# Patient Record
Sex: Male | Born: 1950
Health system: Southern US, Community
[De-identification: ages and names within clinical notes are randomized; demographics above are authoritative.]

## PROBLEM LIST (undated history)

## (undated) DIAGNOSIS — L570 Actinic keratosis: Secondary | ICD-10-CM

## (undated) DIAGNOSIS — I10 Essential (primary) hypertension: Secondary | ICD-10-CM

## (undated) DIAGNOSIS — N200 Calculus of kidney: Secondary | ICD-10-CM

## (undated) HISTORY — DX: Calculus of kidney: N20.0

## (undated) HISTORY — DX: Essential (primary) hypertension: I10

## (undated) HISTORY — PX: TOE SURGERY: SHX1073

## (undated) HISTORY — DX: Actinic keratosis: L57.0

## (undated) HISTORY — PX: SKIN LESION EXCISION: SHX2412

## (undated) HISTORY — PX: COLONOSCOPY: SHX174

---

## 1998-06-07 HISTORY — PX: CHOLECYSTECTOMY: SHX55

## 2013-07-28 LAB — HM COLONOSCOPY

## 2016-06-09 ENCOUNTER — Ambulatory Visit (INDEPENDENT_AMBULATORY_CARE_PROVIDER_SITE_OTHER): Payer: TRICARE For Life (TFL) | Admitting: Family Medicine

## 2016-06-09 ENCOUNTER — Encounter: Payer: Self-pay | Admitting: Family Medicine

## 2016-06-09 VITALS — BP 135/80 | HR 85 | Ht 75.0 in | Wt 275.0 lb

## 2016-06-09 DIAGNOSIS — Z13220 Encounter for screening for lipoid disorders: Secondary | ICD-10-CM

## 2016-06-09 DIAGNOSIS — I10 Essential (primary) hypertension: Secondary | ICD-10-CM

## 2016-06-09 LAB — UA/M W/RFLX CULTURE, ROUTINE
Bilirubin, UA: NEGATIVE
Glucose, UA: NEGATIVE
Ketones, UA: NEGATIVE
LEUKOCYTES UA: NEGATIVE
NITRITE UA: NEGATIVE
PH UA: 6 (ref 5.0–7.5)
Protein, UA: NEGATIVE
RBC, UA: NEGATIVE
Specific Gravity, UA: 1.01 (ref 1.005–1.030)
UUROB: 0.2 mg/dL (ref 0.2–1.0)

## 2016-06-09 LAB — MICROALBUMIN, URINE WAIVED
Creatinine, Urine Waived: 50 mg/dL (ref 10–300)
Microalb, Ur Waived: 10 mg/L (ref 0–19)

## 2016-06-09 NOTE — Progress Notes (Signed)
BP 135/80   Pulse 85   Ht 6' 3"  (1.905 m)   Wt 275 lb (124.7 kg)   SpO2 99%   BMI 34.37 kg/m    Subjective:    Patient ID: Zachary Rogers, male    DOB: 11/30/1950, 66 y.o.   MRN: [id number]  HPI: Zachary Rogers is a 66 y.o. male  Chief Complaint  Patient presents with  . Establish Care   HYPERTENSION Hypertension status: controlled  Satisfied with current treatment? yes Duration of hypertension: chronic BP monitoring frequency:  not checking BP medication side effects:  no Medication compliance: excellent compliance Previous BP meds: amlodipine Aspirin: yes Recurrent headaches: no Visual changes: no Palpitations: no Dyspnea: no Chest pain: no Lower extremity edema: no Dizzy/lightheaded: no  Active Ambulatory Problems    Diagnosis Date Noted  . Hypertension    Resolved Ambulatory Problems    Diagnosis Date Noted  . No Resolved Ambulatory Problems   Past Medical History:  Diagnosis Date  . Hypertension    Past Surgical History:  Procedure Laterality Date  . CHOLECYSTECTOMY  06/07/1998    Outpatient Encounter Prescriptions as of 06/09/2016  Medication Sig  . amLODipine (NORVASC) 5 MG tablet   . Ascorbic Acid (VITAMIN C) 1000 MG tablet Take 1,000 mg by mouth daily.  Marland Kitchen aspirin 325 MG tablet Take 325 mg by mouth daily.  . Cholecalciferol (VITAMIN D3) 3000 units TABS Take 1,000 Units by mouth.  . Cyanocobalamin (VITAMIN B 12 PO) Take 3,000 mcg by mouth daily.  . Glucosamine-Chondroit-Vit C-Mn (GLUCOSAMINE 1500 COMPLEX PO) Take 1,500 mg by mouth 2 (two) times daily.  . Methylsulfonylmethane (MSM) 1500 MG TABS Take 1,500 mg by mouth 2 (two) times daily.  . Multiple Vitamin (MULTIVITAMIN) tablet Take 1 tablet by mouth daily.  . Omega-3 350 MG CPDR Take 360 mg by mouth 2 (two) times daily.  . Omega-3 Fatty Acids (FISH OIL) 1200 MG CAPS Take 1,200 mg by mouth 2 (two) times daily.   No facility-administered encounter medications on file as of 06/09/2016.    No Known  Allergies  Social History   Social History  . Marital status: Married    Spouse name: N/A  . Number of children: N/A  . Years of education: N/A   Occupational History  . Not on file.   Social History Main Topics  . Smoking status: Former Research scientist (life sciences)  . Smokeless tobacco: Never Used  . Alcohol use Not on file  . Drug use: Unknown  . Sexual activity: Not on file   Other Topics Concern  . Not on file   Social History Narrative  . No narrative on file   Family History  Problem Relation Age of Onset  . Lung cancer Mother   . Hypertension Father   . Diabetes Father     Review of Systems  Constitutional: Negative.   Respiratory: Negative.   Cardiovascular: Negative.   Psychiatric/Behavioral: Negative.     Per HPI unless specifically indicated above     Objective:    BP 135/80   Pulse 85   Ht 6' 3"  (1.905 m)   Wt 275 lb (124.7 kg)   SpO2 99%   BMI 34.37 kg/m   Wt Readings from Last 3 Encounters:  06/09/16 275 lb (124.7 kg)    Physical Exam  Constitutional: He is oriented to person, place, and time. He appears well-developed and well-nourished. No distress.  HENT:  Head: Normocephalic and atraumatic.  Right Ear: Hearing normal.  Left Ear: Hearing  normal.  Nose: Nose normal.  Eyes: Conjunctivae and lids are normal. Right eye exhibits no discharge. Left eye exhibits no discharge. No scleral icterus.  Cardiovascular: Normal rate, regular rhythm, normal heart sounds and intact distal pulses.  Exam reveals no gallop and no friction rub.   No murmur heard. Pulmonary/Chest: Effort normal and breath sounds normal. No respiratory distress. He has no wheezes. He has no rales. He exhibits no tenderness.  Musculoskeletal: Normal range of motion.  Neurological: He is alert and oriented to person, place, and time.  Skin: Skin is warm, dry and intact. No rash noted. He is not diaphoretic. No erythema. No pallor.  Psychiatric: He has a normal mood and affect. His speech is  normal and behavior is normal. Judgment and thought content normal. Cognition and memory are normal.  Nursing note and vitals reviewed.   Results for orders placed or performed in visit on 06/09/16  HM COLONOSCOPY  Result Value Ref Range   HM Colonoscopy Patient Reported See Report (in chart), Patient Reported      Assessment & Plan:   Problem List Items Addressed This Visit      Cardiovascular and Mediastinum   Hypertension - Primary    Under good control. Continue current regimen. Continue to monitor. Call with any concerns.       Relevant Medications   amLODipine (NORVASC) 5 MG tablet   aspirin 325 MG tablet   Other Relevant Orders   Microalbumin, Urine Waived   CBC with Differential/Platelet   Comprehensive metabolic panel   TSH   UA/M w/rflx Culture, Routine    Other Visit Diagnoses    Screening for cholesterol level       Labs drawn today. Await results.    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

## 2016-06-09 NOTE — Assessment & Plan Note (Signed)
Under good control. Continue current regimen. Continue to monitor. Call with any concerns. 

## 2016-06-10 LAB — COMPREHENSIVE METABOLIC PANEL
A/G RATIO: 1.8 (ref 1.2–2.2)
ALK PHOS: 82 IU/L (ref 39–117)
ALT: 42 IU/L (ref 0–44)
AST: 27 IU/L (ref 0–40)
Albumin: 4.6 g/dL (ref 3.6–4.8)
BILIRUBIN TOTAL: 1.6 mg/dL — AB (ref 0.0–1.2)
BUN/Creatinine Ratio: 11 (ref 10–24)
BUN: 14 mg/dL (ref 8–27)
CHLORIDE: 102 mmol/L (ref 96–106)
CO2: 23 mmol/L (ref 18–29)
Calcium: 9.3 mg/dL (ref 8.6–10.2)
Creatinine, Ser: 1.22 mg/dL (ref 0.76–1.27)
GFR calc Af Amer: 70 mL/min/{1.73_m2} (ref >59)
GFR calc non Af Amer: 61 mL/min/{1.73_m2} (ref >59)
GLOBULIN, TOTAL: 2.5 g/dL (ref 1.5–4.5)
Glucose: 96 mg/dL (ref 65–99)
POTASSIUM: 4.2 mmol/L (ref 3.5–5.2)
SODIUM: 141 mmol/L (ref 134–144)
Total Protein: 7.1 g/dL (ref 6.0–8.5)

## 2016-06-10 LAB — CBC WITH DIFFERENTIAL/PLATELET
BASOS ABS: 0.1 10*3/uL (ref 0.0–0.2)
BASOS: 1 %
EOS (ABSOLUTE): 0.4 10*3/uL (ref 0.0–0.4)
Eos: 6 %
HEMATOCRIT: 45.7 % (ref 37.5–51.0)
Hemoglobin: 15.8 g/dL (ref 13.0–17.7)
IMMATURE GRANS (ABS): 0 10*3/uL (ref 0.0–0.1)
Immature Granulocytes: 0 %
LYMPHS: 27 %
Lymphocytes Absolute: 1.7 10*3/uL (ref 0.7–3.1)
MCH: 30.3 pg (ref 26.6–33.0)
MCHC: 34.6 g/dL (ref 31.5–35.7)
MCV: 88 fL (ref 79–97)
MONOS ABS: 0.6 10*3/uL (ref 0.1–0.9)
Monocytes: 10 %
NEUTROS ABS: 3.6 10*3/uL (ref 1.4–7.0)
NEUTROS PCT: 56 %
PLATELETS: 210 10*3/uL (ref 150–379)
RBC: 5.21 x10E6/uL (ref 4.14–5.80)
RDW: 13.8 % (ref 12.3–15.4)
WBC: 6.4 10*3/uL (ref 3.4–10.8)

## 2016-06-10 LAB — LIPID PANEL W/O CHOL/HDL RATIO
CHOLESTEROL TOTAL: 228 mg/dL — AB (ref 100–199)
HDL: 63 mg/dL (ref >39)
LDL Calculated: 141 mg/dL — ABNORMAL HIGH (ref 0–99)
TRIGLYCERIDES: 122 mg/dL (ref 0–149)
VLDL Cholesterol Cal: 24 mg/dL (ref 5–40)

## 2016-06-10 LAB — TSH: TSH: 1.46 u[IU]/mL (ref 0.450–4.500)

## 2016-08-14 ENCOUNTER — Encounter: Payer: TRICARE For Life (TFL) | Admitting: Family Medicine

## 2016-08-20 ENCOUNTER — Encounter: Payer: Self-pay | Admitting: Family Medicine

## 2016-08-20 ENCOUNTER — Ambulatory Visit (INDEPENDENT_AMBULATORY_CARE_PROVIDER_SITE_OTHER): Payer: Self-pay | Admitting: Family Medicine

## 2016-08-20 VITALS — BP 152/92 | HR 55 | Ht 76.0 in | Wt 274.0 lb

## 2016-08-20 DIAGNOSIS — Z0289 Encounter for other administrative examinations: Secondary | ICD-10-CM

## 2016-08-20 DIAGNOSIS — I10 Essential (primary) hypertension: Secondary | ICD-10-CM

## 2016-08-20 NOTE — Assessment & Plan Note (Signed)
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 Patient will do dietary modification modest salt and exercise to help gain better control and follow-up in 2 months or so to reevaluate blood pressure.

## 2016-08-20 NOTE — Progress Notes (Signed)
BP (!) 152/92   Pulse (!) 55   Ht 6\' 4"  (1.93 m)   Wt 274 lb (124.3 kg)   BMI 33.35 kg/m    Subjective:    Patient ID: Zachary Rogers, male    DOB: 1950-06-27, 66 y.o.   MRN: [id number]  HPI: Zachary Rogers is a 66 y.o. male  Chief Complaint  Patient presents with  . FAA    Class 2  Patient with previously controlled hypertension and weight and blood pressure elevated. Taking amlodipine without problems or side effects. Patient has gained significant weight over this winter. Discussed Will double down on weight loss recheck in June for blood pressure check if not better will need further evaluation and medications.  Relevant past medical, surgical, family and social history reviewed and updated as indicated. Interim medical history since our last visit reviewed. Allergies and medications reviewed and updated.  Review of Systems  Constitutional: Negative.   HENT: Negative.   Eyes: Negative.   Respiratory: Negative.   Cardiovascular: Negative.   Gastrointestinal: Negative.   Endocrine: Negative.   Genitourinary: Negative.   Musculoskeletal: Negative.   Skin: Negative.   Allergic/Immunologic: Negative.   Neurological: Negative.   Hematological: Negative.   Psychiatric/Behavioral: Negative.     Per HPI unless specifically indicated above     Objective:    BP (!) 152/92   Pulse (!) 55   Ht 6\' 4"  (1.93 m)   Wt 274 lb (124.3 kg)   BMI 33.35 kg/m   Wt Readings from Last 3 Encounters:  08/20/16 274 lb (124.3 kg)  06/09/16 275 lb (124.7 kg)    Physical Exam  Constitutional: He is oriented to person, place, and time. He appears well-developed and well-nourished.  HENT:  Head: Normocephalic.  Right Ear: External ear normal.  Left Ear: External ear normal.  Nose: Nose normal.  Eyes: Conjunctivae and EOM are normal. Pupils are equal, round, and reactive to light.  Neck: Normal range of motion. Neck supple. No thyromegaly present.  Cardiovascular: Normal rate, regular  rhythm, normal heart sounds and intact distal pulses.   Pulmonary/Chest: Effort normal and breath sounds normal.  Abdominal: Soft. Bowel sounds are normal. There is no splenomegaly or hepatomegaly.  Genitourinary: Penis normal.  Musculoskeletal: Normal range of motion.  Lymphadenopathy:    He has no cervical adenopathy.  Neurological: He is alert and oriented to person, place, and time. He has normal reflexes.  Skin: Skin is warm and dry.  Psychiatric: He has a normal mood and affect. His behavior is normal. Judgment and thought content normal.    Results for orders placed or performed in visit on 06/09/16  Microalbumin, Urine Waived  Result Value Ref Range   Microalb, Ur Waived 10 0 - 19 mg/L   Creatinine, Urine Waived 50 10 - 300 mg/dL   Microalb/Creat Ratio <30 <30 mg/g  CBC with Differential/Platelet  Result Value Ref Range   WBC 6.4 3.4 - 10.8 x10E3/uL   RBC 5.21 4.14 - 5.80 x10E6/uL   Hemoglobin 15.8 13.0 - 17.7 g/dL   Hematocrit 45.7 37.5 - 51.0 %   MCV 88 79 - 97 fL   MCH 30.3 26.6 - 33.0 pg   MCHC 34.6 31.5 - 35.7 g/dL   RDW 13.8 12.3 - 15.4 %   Platelets 210 150 - 379 x10E3/uL   Neutrophils 56 Not Estab. %   Lymphs 27 Not Estab. %   Monocytes 10 Not Estab. %   Eos 6 Not Estab. %  Basos 1 Not Estab. %   Neutrophils Absolute 3.6 1.4 - 7.0 x10E3/uL   Lymphocytes Absolute 1.7 0.7 - 3.1 x10E3/uL   Monocytes Absolute 0.6 0.1 - 0.9 x10E3/uL   EOS (ABSOLUTE) 0.4 0.0 - 0.4 x10E3/uL   Basophils Absolute 0.1 0.0 - 0.2 x10E3/uL   Immature Granulocytes 0 Not Estab. %   Immature Grans (Abs) 0.0 0.0 - 0.1 x10E3/uL   Hematology Comments: Note:   Comprehensive metabolic panel  Result Value Ref Range   Glucose 96 65 - 99 mg/dL   BUN 14 8 - 27 mg/dL   Creatinine, Ser 1.22 0.76 - 1.27 mg/dL   GFR calc non Af Amer 61 >59 mL/min/1.73   GFR calc Af Amer 70 >59 mL/min/1.73   BUN/Creatinine Ratio 11 10 - 24   Sodium 141 134 - 144 mmol/L   Potassium 4.2 3.5 - 5.2 mmol/L    Chloride 102 96 - 106 mmol/L   CO2 23 18 - 29 mmol/L   Calcium 9.3 8.6 - 10.2 mg/dL   Total Protein 7.1 6.0 - 8.5 g/dL   Albumin 4.6 3.6 - 4.8 g/dL   Globulin, Total 2.5 1.5 - 4.5 g/dL   Albumin/Globulin Ratio 1.8 1.2 - 2.2   Bilirubin Total 1.6 (H) 0.0 - 1.2 mg/dL   Alkaline Phosphatase 82 39 - 117 IU/L   AST 27 0 - 40 IU/L   ALT 42 0 - 44 IU/L  Lipid Panel w/o Chol/HDL Ratio  Result Value Ref Range   Cholesterol, Total 228 (H) 100 - 199 mg/dL   Triglycerides 122 0 - 149 mg/dL   HDL 63 >39 mg/dL   VLDL Cholesterol Cal 24 5 - 40 mg/dL   LDL Calculated 141 (H) 0 - 99 mg/dL  TSH  Result Value Ref Range   TSH 1.460 0.450 - 4.500 uIU/mL  UA/M w/rflx Culture, Routine  Result Value Ref Range   Specific Gravity, UA 1.010 1.005 - 1.030   pH, UA 6.0 5.0 - 7.5   Color, UA Yellow Yellow   Appearance Ur Clear Clear   Leukocytes, UA Negative Negative   Protein, UA Negative Negative/Trace   Glucose, UA Negative Negative   Ketones, UA Negative Negative   RBC, UA Negative Negative   Bilirubin, UA Negative Negative   Urobilinogen, Ur 0.2 0.2 - 1.0 mg/dL   Nitrite, UA Negative Negative  HM COLONOSCOPY  Result Value Ref Range   HM Colonoscopy Patient Reported See Report (in chart), Patient Reported      Assessment & Plan:   Problem List Items Addressed This Visit      Cardiovascular and Mediastinum   Hypertension    Poor control today but acceptable for the FAA. Patient will do dietary modification modest salt and exercise to help gain better control and follow-up in 2 months or so to reevaluate blood pressure.       Other Visit Diagnoses    History and physical examination, occupation    -  Primary       Follow up plan: Return for BP check.

## 2016-10-23 ENCOUNTER — Ambulatory Visit: Payer: TRICARE For Life (TFL) | Admitting: Family Medicine

## 2016-10-28 ENCOUNTER — Ambulatory Visit: Payer: TRICARE For Life (TFL) | Admitting: Family Medicine

## 2016-11-11 ENCOUNTER — Ambulatory Visit: Payer: TRICARE For Life (TFL) | Admitting: Family Medicine

## 2016-12-03 ENCOUNTER — Ambulatory Visit: Payer: TRICARE For Life (TFL) | Admitting: Family Medicine

## 2016-12-24 ENCOUNTER — Ambulatory Visit: Payer: TRICARE For Life (TFL) | Admitting: Family Medicine

## 2017-02-04 ENCOUNTER — Ambulatory Visit: Payer: TRICARE For Life (TFL) | Admitting: Family Medicine

## 2017-06-02 ENCOUNTER — Encounter: Payer: Self-pay | Admitting: Family Medicine

## 2017-06-02 ENCOUNTER — Ambulatory Visit (INDEPENDENT_AMBULATORY_CARE_PROVIDER_SITE_OTHER): Payer: Medicare Other | Admitting: Family Medicine

## 2017-06-02 VITALS — BP 131/84 | HR 60 | Temp 97.6°F | Ht 74.4 in | Wt 257.0 lb

## 2017-06-02 DIAGNOSIS — Z1159 Encounter for screening for other viral diseases: Secondary | ICD-10-CM

## 2017-06-02 DIAGNOSIS — D229 Melanocytic nevi, unspecified: Secondary | ICD-10-CM | POA: Diagnosis not present

## 2017-06-02 DIAGNOSIS — Z125 Encounter for screening for malignant neoplasm of prostate: Secondary | ICD-10-CM

## 2017-06-02 DIAGNOSIS — Z13220 Encounter for screening for lipoid disorders: Secondary | ICD-10-CM | POA: Diagnosis not present

## 2017-06-02 DIAGNOSIS — I10 Essential (primary) hypertension: Secondary | ICD-10-CM

## 2017-06-02 LAB — UA/M W/RFLX CULTURE, ROUTINE
Bilirubin, UA: NEGATIVE
Glucose, UA: NEGATIVE
Ketones, UA: NEGATIVE
Leukocytes, UA: NEGATIVE
NITRITE UA: NEGATIVE
Protein, UA: NEGATIVE
RBC, UA: NEGATIVE
Specific Gravity, UA: 1.015 (ref 1.005–1.030)
UUROB: 0.2 mg/dL (ref 0.2–1.0)
pH, UA: 7 (ref 5.0–7.5)

## 2017-06-02 LAB — MICROALBUMIN, URINE WAIVED
Creatinine, Urine Waived: 200 mg/dL (ref 10–300)
MICROALB, UR WAIVED: 30 mg/L — AB (ref 0–19)
Microalb/Creat Ratio: <30 mg/g (ref <30)

## 2017-06-02 MED ORDER — AMLODIPINE BESYLATE 5 MG PO TABS: 5.0000 mg | ORAL_TABLET | Freq: Every day | ORAL | 3 refills | Status: DC

## 2017-06-02 NOTE — Progress Notes (Signed)
BP 131/84 (BP Location: Left Arm, Patient Position: Sitting, Cuff Size: Large)   Pulse 60   Temp 97.6 F (36.4 C)   Ht 6' 2.4" (1.89 m)   Wt 257 lb (116.6 kg)   SpO2 95%   BMI 32.64 kg/m    Subjective:    Patient ID: Zachary Rogers, male    DOB: January 06, 1951, 67 y.o.   MRN: [id number]  HPI: Zachary Rogers is a 67 y.o. male  Chief Complaint  Patient presents with  . Hypertension   HYPERTENSION Hypertension status: controlled  Satisfied with current treatment? no Duration of hypertension: chronic BP monitoring frequency:  not checking BP medication side effects:  no Medication compliance: excellent compliance Previous BP meds: amlodipine Aspirin: yes Recurrent headaches: no Visual changes: no Palpitations: no Dyspnea: no Chest pain: no Lower extremity edema: no Dizzy/lightheaded: no  Relevant past medical, surgical, family and social history reviewed and updated as indicated. Interim medical history since our last visit reviewed. Allergies and medications reviewed and updated.  Review of Systems  Constitutional: Negative.   Respiratory: Negative.   Cardiovascular: Negative.   Psychiatric/Behavioral: Negative.     Per HPI unless specifically indicated above     Objective:    BP 131/84 (BP Location: Left Arm, Patient Position: Sitting, Cuff Size: Large)   Pulse 60   Temp 97.6 F (36.4 C)   Ht 6' 2.4" (1.89 m)   Wt 257 lb (116.6 kg)   SpO2 95%   BMI 32.64 kg/m   Wt Readings from Last 3 Encounters:  06/02/17 257 lb (116.6 kg)  08/20/16 274 lb (124.3 kg)  06/09/16 275 lb (124.7 kg)    Physical Exam  Constitutional: He is oriented to person, place, and time. He appears well-developed and well-nourished. No distress.  HENT:  Head: Normocephalic and atraumatic.  Right Ear: Hearing normal.  Left Ear: Hearing normal.  Nose: Nose normal.  Eyes: Conjunctivae and lids are normal. Right eye exhibits no discharge. Left eye exhibits no discharge. No scleral icterus.    Cardiovascular: Normal rate, regular rhythm, normal heart sounds and intact distal pulses. Exam reveals no gallop and no friction rub.  No murmur heard. Pulmonary/Chest: Effort normal and breath sounds normal. No respiratory distress. He has no wheezes. He has no rales. He exhibits no tenderness.  Musculoskeletal: Normal range of motion.  Neurological: He is alert and oriented to person, place, and time.  Skin: Skin is warm, dry and intact. No rash noted. He is not diaphoretic. No erythema. No pallor.  Numerous moles  Psychiatric: He has a normal mood and affect. His speech is normal and behavior is normal. Judgment and thought content normal. Cognition and memory are normal.  Nursing note and vitals reviewed.   Results for orders placed or performed in visit on 06/09/16  Microalbumin, Urine Waived  Result Value Ref Range   Microalb, Ur Waived 10 0 - 19 mg/L   Creatinine, Urine Waived 50 10 - 300 mg/dL   Microalb/Creat Ratio <30 <30 mg/g  CBC with Differential/Platelet  Result Value Ref Range   WBC 6.4 3.4 - 10.8 x10E3/uL   RBC 5.21 4.14 - 5.80 x10E6/uL   Hemoglobin 15.8 13.0 - 17.7 g/dL   Hematocrit 45.7 37.5 - 51.0 %   MCV 88 79 - 97 fL   MCH 30.3 26.6 - 33.0 pg   MCHC 34.6 31.5 - 35.7 g/dL   RDW 13.8 12.3 - 15.4 %   Platelets 210 150 - 379 x10E3/uL   Neutrophils  56 Not Estab. %   Lymphs 27 Not Estab. %   Monocytes 10 Not Estab. %   Eos 6 Not Estab. %   Basos 1 Not Estab. %   Neutrophils Absolute 3.6 1.4 - 7.0 x10E3/uL   Lymphocytes Absolute 1.7 0.7 - 3.1 x10E3/uL   Monocytes Absolute 0.6 0.1 - 0.9 x10E3/uL   EOS (ABSOLUTE) 0.4 0.0 - 0.4 x10E3/uL   Basophils Absolute 0.1 0.0 - 0.2 x10E3/uL   Immature Granulocytes 0 Not Estab. %   Immature Grans (Abs) 0.0 0.0 - 0.1 x10E3/uL   Hematology Comments: Note:   Comprehensive metabolic panel  Result Value Ref Range   Glucose 96 65 - 99 mg/dL   BUN 14 8 - 27 mg/dL   Creatinine, Ser 1.22 0.76 - 1.27 mg/dL   GFR calc non Af Amer  61 >59 mL/min/1.73   GFR calc Af Amer 70 >59 mL/min/1.73   BUN/Creatinine Ratio 11 10 - 24   Sodium 141 134 - 144 mmol/L   Potassium 4.2 3.5 - 5.2 mmol/L   Chloride 102 96 - 106 mmol/L   CO2 23 18 - 29 mmol/L   Calcium 9.3 8.6 - 10.2 mg/dL   Total Protein 7.1 6.0 - 8.5 g/dL   Albumin 4.6 3.6 - 4.8 g/dL   Globulin, Total 2.5 1.5 - 4.5 g/dL   Albumin/Globulin Ratio 1.8 1.2 - 2.2   Bilirubin Total 1.6 (H) 0.0 - 1.2 mg/dL   Alkaline Phosphatase 82 39 - 117 IU/L   AST 27 0 - 40 IU/L   ALT 42 0 - 44 IU/L  Lipid Panel w/o Chol/HDL Ratio  Result Value Ref Range   Cholesterol, Total 228 (H) 100 - 199 mg/dL   Triglycerides 122 0 - 149 mg/dL   HDL 63 >39 mg/dL   VLDL Cholesterol Cal 24 5 - 40 mg/dL   LDL Calculated 141 (H) 0 - 99 mg/dL  TSH  Result Value Ref Range   TSH 1.460 0.450 - 4.500 uIU/mL  UA/M w/rflx Culture, Routine  Result Value Ref Range   Specific Gravity, UA 1.010 1.005 - 1.030   pH, UA 6.0 5.0 - 7.5   Color, UA Yellow Yellow   Appearance Ur Clear Clear   Leukocytes, UA Negative Negative   Protein, UA Negative Negative/Trace   Glucose, UA Negative Negative   Ketones, UA Negative Negative   RBC, UA Negative Negative   Bilirubin, UA Negative Negative   Urobilinogen, Ur 0.2 0.2 - 1.0 mg/dL   Nitrite, UA Negative Negative  HM COLONOSCOPY  Result Value Ref Range   HM Colonoscopy Patient Reported See Report (in chart), Patient Reported      Assessment & Plan:   Problem List Items Addressed This Visit      Cardiovascular and Mediastinum   Hypertension - Primary    Under good control. Continue current regimen. Refills given. Continue to monitor. Call with any concerns.       Relevant Medications   amLODipine (NORVASC) 5 MG tablet   Other Relevant Orders   CBC with Differential/Platelet   Comprehensive metabolic panel   Microalbumin, Urine Waived   TSH   UA/M w/rflx Culture, Routine    Other Visit Diagnoses    Screening for cholesterol level       Labs drawn  today. Await results.    Relevant Orders   Lipid Panel w/o Chol/HDL Ratio   Screening for prostate cancer       Labs drawn today. Await results.  Relevant Orders   PSA   Encounter for hepatitis C screening test for low risk patient       Labs drawn today. Await results.   Relevant Orders   Hepatitis C antibody   Numerous moles       Would like to see dermatology. Referral generated today.   Relevant Orders   Ambulatory referral to Dermatology       Follow up plan: Return When he'd like, for Physical.

## 2017-06-02 NOTE — Assessment & Plan Note (Signed)
Under good control. Continue current regimen. Refills given. Continue to monitor. Call with any concerns.

## 2017-06-03 LAB — TSH: TSH: 1.87 u[IU]/mL (ref 0.450–4.500)

## 2017-06-03 LAB — COMPREHENSIVE METABOLIC PANEL
ALBUMIN: 4.7 g/dL (ref 3.6–4.8)
ALT: 32 IU/L (ref 0–44)
AST: 20 IU/L (ref 0–40)
Albumin/Globulin Ratio: 1.9 (ref 1.2–2.2)
Alkaline Phosphatase: 85 IU/L (ref 39–117)
BILIRUBIN TOTAL: 1.4 mg/dL — AB (ref 0.0–1.2)
BUN / CREAT RATIO: 12 (ref 10–24)
BUN: 15 mg/dL (ref 8–27)
CHLORIDE: 104 mmol/L (ref 96–106)
CO2: 24 mmol/L (ref 20–29)
CREATININE: 1.21 mg/dL (ref 0.76–1.27)
Calcium: 9.5 mg/dL (ref 8.6–10.2)
GFR calc Af Amer: 71 mL/min/{1.73_m2} (ref >59)
GFR calc non Af Amer: 61 mL/min/{1.73_m2} (ref >59)
GLOBULIN, TOTAL: 2.5 g/dL (ref 1.5–4.5)
Glucose: 97 mg/dL (ref 65–99)
POTASSIUM: 4.4 mmol/L (ref 3.5–5.2)
SODIUM: 141 mmol/L (ref 134–144)
Total Protein: 7.2 g/dL (ref 6.0–8.5)

## 2017-06-03 LAB — CBC WITH DIFFERENTIAL/PLATELET
Basophils Absolute: 0.1 x10E3/uL (ref 0.0–0.2)
Basos: 1 %
EOS (ABSOLUTE): 0.2 x10E3/uL (ref 0.0–0.4)
Eos: 4 %
Hematocrit: 46.4 % (ref 37.5–51.0)
Hemoglobin: 16.1 g/dL (ref 13.0–17.7)
Immature Grans (Abs): 0 x10E3/uL (ref 0.0–0.1)
Immature Granulocytes: 0 %
Lymphocytes Absolute: 1.5 x10E3/uL (ref 0.7–3.1)
Lymphs: 27 %
MCH: 30.6 pg (ref 26.6–33.0)
MCHC: 34.7 g/dL (ref 31.5–35.7)
MCV: 88 fL (ref 79–97)
Monocytes Absolute: 0.4 x10E3/uL (ref 0.1–0.9)
Monocytes: 7 %
Neutrophils Absolute: 3.4 x10E3/uL (ref 1.4–7.0)
Neutrophils: 61 %
Platelets: 226 x10E3/uL (ref 150–379)
RBC: 5.26 x10E6/uL (ref 4.14–5.80)
RDW: 13.9 % (ref 12.3–15.4)
WBC: 5.5 x10E3/uL (ref 3.4–10.8)

## 2017-06-03 LAB — LIPID PANEL W/O CHOL/HDL RATIO
Cholesterol, Total: 193 mg/dL (ref 100–199)
HDL: 64 mg/dL (ref >39)
LDL CALC: 104 mg/dL — AB (ref 0–99)
TRIGLYCERIDES: 123 mg/dL (ref 0–149)
VLDL Cholesterol Cal: 25 mg/dL (ref 5–40)

## 2017-06-03 LAB — PSA: PROSTATE SPECIFIC AG, SERUM: 2.7 ng/mL (ref 0.0–4.0)

## 2017-06-03 LAB — HEPATITIS C ANTIBODY: HEP C VIRUS AB: 0.1 {s_co_ratio} (ref 0.0–0.9)

## 2017-06-19 ENCOUNTER — Encounter: Payer: Self-pay | Admitting: Family Medicine

## 2017-06-19 ENCOUNTER — Ambulatory Visit (INDEPENDENT_AMBULATORY_CARE_PROVIDER_SITE_OTHER): Payer: Medicare Other | Admitting: Family Medicine

## 2017-06-19 VITALS — BP 129/80 | HR 62 | Temp 97.8°F | Ht 75.0 in | Wt 258.5 lb

## 2017-06-19 DIAGNOSIS — Z0000 Encounter for general adult medical examination without abnormal findings: Secondary | ICD-10-CM | POA: Diagnosis not present

## 2017-06-19 NOTE — Progress Notes (Signed)
BP 129/80 (BP Location: Left Arm, Patient Position: Sitting, Cuff Size: Large)   Pulse 62   Temp 97.8 F (36.6 C)   Ht 6\' 3"  (1.905 m)   Wt 258 lb 8 oz (117.3 kg)   SpO2 95%   BMI 32.31 kg/m    Subjective:    Patient ID: Zachary Rogers, male    DOB: [birth year], 67 y.o.   MRN: [id number]  HPI: Zachary Rogers is a 67 y.o. male presenting on 06/19/2017 for comprehensive medical examination. Current medical complaints include:none  Interim Problems from his last visit: no  Functional Status Survey: Is the patient deaf or have difficulty hearing?: No Does the patient have difficulty seeing, even when wearing glasses/contacts?: No Does the patient have difficulty concentrating, remembering, or making decisions?: No Does the patient have difficulty walking or climbing stairs?: No Does the patient have difficulty dressing or bathing?: No Does the patient have difficulty doing errands alone such as visiting a doctor's office or shopping?: No  FALL RISK: Fall Risk  06/19/2017 06/02/2017 06/09/2016  Falls in the past year? No No No    Depression Screen Depression screen [facility] 2/9 06/19/2017 06/02/2017 06/09/2016  Decreased Interest 0 0 0  Down, Depressed, Hopeless 0 0 0  PHQ - 2 Score 0 0 0    Advanced Directives Up to date- asked for copy  Past Medical History:  Past Medical History:  Diagnosis Date  . Hypertension     Surgical History:  Past Surgical History:  Procedure Laterality Date  . CHOLECYSTECTOMY  06/07/1998    Medications:  Current Outpatient Medications on File Prior to Visit  Medication Sig  . amLODipine (NORVASC) 5 MG tablet Take 1 tablet (5 mg total) by mouth daily.  . Ascorbic Acid (VITAMIN C) 1000 MG tablet Take 1,000 mg by mouth daily.  Marland Kitchen aspirin 325 MG tablet Take 325 mg by mouth daily.  . Cholecalciferol (VITAMIN D3) 3000 units TABS Take 1,000 Units by mouth.  . Cyanocobalamin (VITAMIN B 12 PO) Take 3,000 mcg by mouth daily.  . Glucosamine-Chondroit-Vit C-Mn  (GLUCOSAMINE 1500 COMPLEX PO) Take 1,500 mg by mouth 2 (two) times daily.  . Methylsulfonylmethane (MSM) 1500 MG TABS Take 1,500 mg by mouth 2 (two) times daily.  . Multiple Vitamin (MULTIVITAMIN) tablet Take 1 tablet by mouth daily.  . Omega-3 Fatty Acids (FISH OIL) 1200 MG CAPS Take 1,200 mg by mouth 2 (two) times daily.   No current facility-administered medications on file prior to visit.     Allergies:  No Known Allergies  Social History:  Social History   Socioeconomic History  . Marital status: Married    Spouse name: Not on file  . Number of children: Not on file  . Years of education: Not on file  . Highest education level: Not on file  Social Needs  . Financial resource strain: Not on file  . Food insecurity - worry: Not on file  . Food insecurity - inability: Not on file  . Transportation needs - medical: Not on file  . Transportation needs - non-medical: Not on file  Occupational History  . Not on file  Tobacco Use  . Smoking status: Former Research scientist (life sciences)  . Smokeless tobacco: Never Used  Substance and Sexual Activity  . Alcohol use: Yes    Alcohol/week: 1.2 - 1.8 oz    Types: 2 - 3 Shots of liquor per week  . Drug use: No  . Sexual activity: Yes  Other Topics Concern  . Not  on file  Social History Narrative  . Not on file   Social History   Tobacco Use  Smoking Status Former Smoker  Smokeless Tobacco Never Used   Social History   Substance and Sexual Activity  Alcohol Use Yes  . Alcohol/week: 1.2 - 1.8 oz  . Types: 2 - 3 Shots of liquor per week    Family History:  Family History  Problem Relation Age of Onset  . Lung cancer Mother   . Hypertension Father   . Diabetes Father     Past medical history, surgical history, medications, allergies, family history and social history reviewed with patient today and changes made to appropriate areas of the chart.   Review of Systems  Constitutional: Negative.   HENT: Negative.   Eyes: Negative.     Respiratory: Negative.   Cardiovascular: Negative.   Gastrointestinal: Negative.   Genitourinary: Negative.   Musculoskeletal: Positive for joint pain. Negative for back pain, falls, myalgias and neck pain.  Skin: Negative.   Neurological: Negative.   Endo/Heme/Allergies: Negative.   Psychiatric/Behavioral: Negative.     All other ROS negative except what is listed above and in the HPI.      Objective:    BP 129/80 (BP Location: Left Arm, Patient Position: Sitting, Cuff Size: Large)   Pulse 62   Temp 97.8 F (36.6 C)   Ht 6\' 3"  (1.905 m)   Wt 258 lb 8 oz (117.3 kg)   SpO2 95%   BMI 32.31 kg/m   Wt Readings from Last 3 Encounters:  06/19/17 258 lb 8 oz (117.3 kg)  06/02/17 257 lb (116.6 kg)  08/20/16 274 lb (124.3 kg)    Physical Exam  Constitutional: He is oriented to person, place, and time. He appears well-developed and well-nourished. No distress.  HENT:  Head: Normocephalic and atraumatic.  Right Ear: Hearing, tympanic membrane, external ear and ear canal normal.  Left Ear: Hearing, tympanic membrane, external ear and ear canal normal.  Nose: Nose normal.  Mouth/Throat: Uvula is midline, oropharynx is clear and moist and mucous membranes are normal. No oropharyngeal exudate.  Eyes: Conjunctivae, EOM and lids are normal. Pupils are equal, round, and reactive to light. Right eye exhibits no discharge. Left eye exhibits no discharge. No scleral icterus.  Neck: Normal range of motion. Neck supple. No JVD present. No tracheal deviation present. No thyromegaly present.  Cardiovascular: Normal rate, regular rhythm, normal heart sounds and intact distal pulses. Exam reveals no gallop and no friction rub.  No murmur heard. Pulmonary/Chest: Effort normal and breath sounds normal. No stridor. No respiratory distress. He has no wheezes. He has no rales. He exhibits no tenderness.  Abdominal: Soft. Bowel sounds are normal. He exhibits no distension and no mass. There is no  tenderness. There is no rebound and no guarding.  Genitourinary:  Genitourinary Comments: Penis and prostate exam deferred with shared decision making  Musculoskeletal: Normal range of motion. He exhibits no edema, tenderness or deformity.  Lymphadenopathy:    He has no cervical adenopathy.  Neurological: He is alert and oriented to person, place, and time. He has normal reflexes. He displays normal reflexes. No cranial nerve deficit. He exhibits normal muscle tone. Coordination normal.  Skin: Skin is warm, dry and intact. No rash noted. He is not diaphoretic. No erythema. No pallor.  Psychiatric: He has a normal mood and affect. His speech is normal and behavior is normal. Judgment and thought content normal. Cognition and memory are normal.  Nursing note  and vitals reviewed.   6CIT Screen 06/19/2017  What Year? 0 points  What month? 0 points  What time? 0 points  Count back from 20 0 points  Months in reverse 0 points  Repeat phrase 4 points  Total Score 4     Results for orders placed or performed in visit on 06/02/17  CBC with Differential/Platelet  Result Value Ref Range   WBC 5.5 3.4 - 10.8 x10E3/uL   RBC 5.26 4.14 - 5.80 x10E6/uL   Hemoglobin 16.1 13.0 - 17.7 g/dL   Hematocrit 46.4 37.5 - 51.0 %   MCV 88 79 - 97 fL   MCH 30.6 26.6 - 33.0 pg   MCHC 34.7 31.5 - 35.7 g/dL   RDW 13.9 12.3 - 15.4 %   Platelets 226 150 - 379 x10E3/uL   Neutrophils 61 Not Estab. %   Lymphs 27 Not Estab. %   Monocytes 7 Not Estab. %   Eos 4 Not Estab. %   Basos 1 Not Estab. %   Neutrophils Absolute 3.4 1.4 - 7.0 x10E3/uL   Lymphocytes Absolute 1.5 0.7 - 3.1 x10E3/uL   Monocytes Absolute 0.4 0.1 - 0.9 x10E3/uL   EOS (ABSOLUTE) 0.2 0.0 - 0.4 x10E3/uL   Basophils Absolute 0.1 0.0 - 0.2 x10E3/uL   Immature Granulocytes 0 Not Estab. %   Immature Grans (Abs) 0.0 0.0 - 0.1 x10E3/uL  Comprehensive metabolic panel  Result Value Ref Range   Glucose 97 65 - 99 mg/dL   BUN 15 8 - 27 mg/dL    Creatinine, Ser 1.21 0.76 - 1.27 mg/dL   GFR calc non Af Amer 61 >59 mL/min/1.73   GFR calc Af Amer 71 >59 mL/min/1.73   BUN/Creatinine Ratio 12 10 - 24   Sodium 141 134 - 144 mmol/L   Potassium 4.4 3.5 - 5.2 mmol/L   Chloride 104 96 - 106 mmol/L   CO2 24 20 - 29 mmol/L   Calcium 9.5 8.6 - 10.2 mg/dL   Total Protein 7.2 6.0 - 8.5 g/dL   Albumin 4.7 3.6 - 4.8 g/dL   Globulin, Total 2.5 1.5 - 4.5 g/dL   Albumin/Globulin Ratio 1.9 1.2 - 2.2   Bilirubin Total 1.4 (H) 0.0 - 1.2 mg/dL   Alkaline Phosphatase 85 39 - 117 IU/L   AST 20 0 - 40 IU/L   ALT 32 0 - 44 IU/L  Lipid Panel w/o Chol/HDL Ratio  Result Value Ref Range   Cholesterol, Total 193 100 - 199 mg/dL   Triglycerides 123 0 - 149 mg/dL   HDL 64 >39 mg/dL   VLDL Cholesterol Cal 25 5 - 40 mg/dL   LDL Calculated 104 (H) 0 - 99 mg/dL  Microalbumin, Urine Waived  Result Value Ref Range   Microalb, Ur Waived 30 (H) 0 - 19 mg/L   Creatinine, Urine Waived 200 10 - 300 mg/dL   Microalb/Creat Ratio <30 <30 mg/g  PSA  Result Value Ref Range   Prostate Specific Ag, Serum 2.7 0.0 - 4.0 ng/mL  TSH  Result Value Ref Range   TSH 1.870 0.450 - 4.500 uIU/mL  UA/M w/rflx Culture, Routine  Result Value Ref Range   Specific Gravity, UA 1.015 1.005 - 1.030   pH, UA 7.0 5.0 - 7.5   Color, UA Yellow Yellow   Appearance Ur Clear Clear   Leukocytes, UA Negative Negative   Protein, UA Negative Negative/Trace   Glucose, UA Negative Negative   Ketones, UA Negative Negative   RBC, UA  Negative Negative   Bilirubin, UA Negative Negative   Urobilinogen, Ur 0.2 0.2 - 1.0 mg/dL   Nitrite, UA Negative Negative  Hepatitis C antibody  Result Value Ref Range   Hep C Virus Ab 0.1 0.0 - 0.9 s/co ratio      Assessment & Plan:   Problem List Items Addressed This Visit    None    Visit Diagnoses    Medicare annual wellness visit, subsequent    -  Primary   Preventive care discussed today as below. Up to date. Call with any concerns.    Routine  general medical examination at a health care facility       Vaccines up to date. Screening labs checked last visit and normal. Colonoscopy up to date. Continue diet and exercise. Call with any concerns.        Preventative Services:  Health Risk Assessment and Personalized Prevention Plan: Done today Bone Mass Measurements: N/A CVD Screening: Up to date Colon Cancer Screening: Up to date Depression Screening: Done today Diabetes Screening: Up to date Glaucoma Screening: See your eye doctor Hepatitis B vaccine: N/A Hepatitis C screening: Up to date HIV Screening: N/A Flu Vaccine: up to date Lung cancer Screening: N/A Obesity Screening: done today Pneumonia Vaccines (2): up to date STI Screening: N/A PSA screening: up to date  Discussed aspirin prophylaxis for myocardial infarction prevention and decision was made to continue ASA  LABORATORY TESTING:  Health maintenance labs ordered today as discussed above.   The natural history of prostate cancer and ongoing controversy regarding screening and potential treatment outcomes of prostate cancer has been discussed with the patient. The meaning of a false positive PSA and a false negative PSA has been discussed. He indicates understanding of the limitations of this screening test and wishes to proceed with screening PSA testing- done previously.   IMMUNIZATIONS:   - Tdap: Tetanus vaccination status reviewed: last tetanus booster within 10 years. - Influenza: Up to date - Pneumovax: Up to date - Prevnar: Up to date - Zostavax vaccine: Up to date  SCREENING: - Colonoscopy: Up to date  Discussed with patient purpose of the colonoscopy is to detect colon cancer at curable precancerous or early stages   PATIENT COUNSELING:    Sexuality: Discussed sexually transmitted diseases, partner selection, use of condoms, avoidance of unintended pregnancy  and contraceptive alternatives.   Advised to avoid cigarette smoking.  I discussed  with the patient that most people either abstain from alcohol or drink within safe limits (<=14/week and <=4 drinks/occasion for males, <=7/weeks and <= 3 drinks/occasion for females) and that the risk for alcohol disorders and other health effects rises proportionally with the number of drinks per week and how often a drinker exceeds daily limits.  Discussed cessation/primary prevention of drug use and availability of treatment for abuse.   Diet: Encouraged to adjust caloric intake to maintain  or achieve ideal body weight, to reduce intake of dietary saturated fat and total fat, to limit sodium intake by avoiding high sodium foods and not adding table salt, and to maintain adequate dietary potassium and calcium preferably from fresh fruits, vegetables, and low-fat dairy products.    stressed the importance of regular exercise  Injury prevention: Discussed safety belts, safety helmets, smoke detector, smoking near bedding or upholstery.   Dental health: Discussed importance of regular tooth brushing, flossing, and dental visits.   Follow up plan: NEXT PREVENTATIVE PHYSICAL DUE IN 1 YEAR. Return in about 1 year (around 06/19/2018)  for Wellness.

## 2017-06-19 NOTE — Patient Instructions (Addendum)
Preventative Services:  Health Risk Assessment and Personalized Prevention Plan: Done today Bone Mass Measurements: N/A CVD Screening: Up to date Colon Cancer Screening: Up to date Depression Screening: Done today Diabetes Screening: Up to date Glaucoma Screening: See your eye doctor Hepatitis B vaccine: N/A Hepatitis C screening: Up to date HIV Screening: N/A Flu Vaccine: up to date Lung cancer Screening: N/A Obesity Screening: done today Pneumonia Vaccines (2): up to date STI Screening: N/A PSA screening: up to date   Health Maintenance, Male A healthy lifestyle and preventive care is important for your health and wellness. Ask your health care provider about what schedule of regular examinations is right for you. What should I know about weight and diet? Eat a Healthy Diet  Eat plenty of vegetables, fruits, whole grains, low-fat dairy products, and lean protein.  Do not eat a lot of foods high in solid fats, added sugars, or salt.  Maintain a Healthy Weight Regular exercise can help you achieve or maintain a healthy weight. You should:  Do at least 150 minutes of exercise each week. The exercise should increase your heart rate and make you sweat (moderate-intensity exercise).  Do strength-training exercises at least twice a week.  Watch Your Levels of Cholesterol and Blood Lipids  Have your blood tested for lipids and cholesterol every 5 years starting at 69 years of age. If you are at high risk for heart disease, you should start having your blood tested when you are 69 years old. You may need to have your cholesterol levels checked more often if: ? Your lipid or cholesterol levels are high. ? You are older than 69 years of age. ? You are at high risk for heart disease.  What should I know about cancer screening? Many types of cancers can be detected early and may often be prevented. Lung Cancer  You should be screened every year for lung cancer if: ? You are a  current smoker who has smoked for at least 30 years. ? You are a former smoker who has quit within the past 15 years.  Talk to your health care provider about your screening options, when you should start screening, and how often you should be screened.  Colorectal Cancer  Routine colorectal cancer screening usually begins at 69 years of age and should be repeated every 5-10 years until you are 69 years old. You may need to be screened more often if early forms of precancerous polyps or small growths are found. Your health care provider may recommend screening at an earlier age if you have risk factors for colon cancer.  Your health care provider may recommend using home test kits to check for hidden blood in the stool.  A small camera at the end of a tube can be used to examine your colon (sigmoidoscopy or colonoscopy). This checks for the earliest forms of colorectal cancer.  Prostate and Testicular Cancer  Depending on your age and overall health, your health care provider may do certain tests to screen for prostate and testicular cancer.  Talk to your health care provider about any symptoms or concerns you have about testicular or prostate cancer.  Skin Cancer  Check your skin from head to toe regularly.  Tell your health care provider about any new moles or changes in moles, especially if: ? There is a change in a mole's size, shape, or color. ? You have a mole that is larger than a pencil eraser.  Always use sunscreen. Apply sunscreen liberally  and repeat throughout the day.  Protect yourself by wearing long sleeves, pants, a wide-brimmed hat, and sunglasses when outside.  What should I know about heart disease, diabetes, and high blood pressure?  If you are 5-63 years of age, have your blood pressure checked every 3-5 years. If you are 40 years of age or older, have your blood pressure checked every year. You should have your blood pressure measured twice-once when you are at  a hospital or clinic, and once when you are not at a hospital or clinic. Record the average of the two measurements. To check your blood pressure when you are not at a hospital or clinic, you can use: ? An automated blood pressure machine at a pharmacy. ? A home blood pressure monitor.  Talk to your health care provider about your target blood pressure.  If you are between 63-106 years old, ask your health care provider if you should take aspirin to prevent heart disease.  Have regular diabetes screenings by checking your fasting blood sugar level. ? If you are at a normal weight and have a low risk for diabetes, have this test once every three years after the age of 27. ? If you are overweight and have a high risk for diabetes, consider being tested at a younger age or more often.  A one-time screening for abdominal aortic aneurysm (AAA) by ultrasound is recommended for men aged 19-75 years who are current or former smokers. What should I know about preventing infection? Hepatitis B If you have a higher risk for hepatitis B, you should be screened for this virus. Talk with your health care provider to find out if you are at risk for hepatitis B infection. Hepatitis C Blood testing is recommended for:  Everyone born from [birth year] through 1967.  Anyone with known risk factors for hepatitis C.  Sexually Transmitted Diseases (STDs)  You should be screened each year for STDs including gonorrhea and chlamydia if: ? You are sexually active and are younger than 69 years of age. ? You are older than 69 years of age and your health care provider tells you that you are at risk for this type of infection. ? Your sexual activity has changed since you were last screened and you are at an increased risk for chlamydia or gonorrhea. Ask your health care provider if you are at risk.  Talk with your health care provider about whether you are at high risk of being infected with HIV. Your health care provider  may recommend a prescription medicine to help prevent HIV infection.  What else can I do?  Schedule regular health, dental, and eye exams.  Stay current with your vaccines (immunizations).  Do not use any tobacco products, such as cigarettes, chewing tobacco, and e-cigarettes. If you need help quitting, ask your health care provider.  Limit alcohol intake to no more than 2 drinks per day. One drink equals 12 ounces of beer, 5 ounces of wine, or 1 ounces of hard liquor.  Do not use street drugs.  Do not share needles.  Ask your health care provider for help if you need support or information about quitting drugs.  Tell your health care provider if you often feel depressed.  Tell your health care provider if you have ever been abused or do not feel safe at home. This information is not intended to replace advice given to you by your health care provider. Make sure you discuss any questions you have with your health  care provider. Document Released: 11/01/2007 Document Revised: 01/02/2016 Document Reviewed: 02/06/2015 Elsevier Interactive Patient Education  2018 [employer]  

## 2017-06-22 DIAGNOSIS — D1800 Hemangioma unspecified site: Secondary | ICD-10-CM | POA: Diagnosis not present

## 2017-06-22 DIAGNOSIS — D225 Melanocytic nevi of trunk: Secondary | ICD-10-CM | POA: Diagnosis not present

## 2017-06-22 DIAGNOSIS — L821 Other seborrheic keratosis: Secondary | ICD-10-CM | POA: Diagnosis not present

## 2017-06-22 DIAGNOSIS — L578 Other skin changes due to chronic exposure to nonionizing radiation: Secondary | ICD-10-CM | POA: Diagnosis not present

## 2017-06-22 DIAGNOSIS — L918 Other hypertrophic disorders of the skin: Secondary | ICD-10-CM | POA: Diagnosis not present

## 2017-06-22 DIAGNOSIS — L859 Epidermal thickening, unspecified: Secondary | ICD-10-CM | POA: Diagnosis not present

## 2017-06-22 DIAGNOSIS — L812 Freckles: Secondary | ICD-10-CM | POA: Diagnosis not present

## 2017-06-22 DIAGNOSIS — D229 Melanocytic nevi, unspecified: Secondary | ICD-10-CM | POA: Diagnosis not present

## 2017-06-22 DIAGNOSIS — B078 Other viral warts: Secondary | ICD-10-CM | POA: Diagnosis not present

## 2017-06-22 DIAGNOSIS — D239 Other benign neoplasm of skin, unspecified: Secondary | ICD-10-CM

## 2017-06-22 DIAGNOSIS — Z1283 Encounter for screening for malignant neoplasm of skin: Secondary | ICD-10-CM | POA: Diagnosis not present

## 2017-06-22 DIAGNOSIS — D485 Neoplasm of uncertain behavior of skin: Secondary | ICD-10-CM | POA: Diagnosis not present

## 2017-06-22 HISTORY — DX: Other benign neoplasm of skin, unspecified: D23.9

## 2017-08-20 ENCOUNTER — Emergency Department
Admission: EM | Admit: 2017-08-20 | Discharge: 2017-08-21 | Disposition: A | Discharge status: Home / Self Care | Payer: Medicare Other | Attending: Emergency Medicine | Admitting: Emergency Medicine

## 2017-08-20 ENCOUNTER — Other Ambulatory Visit: Payer: Self-pay

## 2017-08-20 ENCOUNTER — Emergency Department: Payer: Medicare Other

## 2017-08-20 DIAGNOSIS — R109 Unspecified abdominal pain: Secondary | ICD-10-CM | POA: Diagnosis not present

## 2017-08-20 DIAGNOSIS — I10 Essential (primary) hypertension: Secondary | ICD-10-CM | POA: Diagnosis not present

## 2017-08-20 DIAGNOSIS — N132 Hydronephrosis with renal and ureteral calculous obstruction: Secondary | ICD-10-CM | POA: Diagnosis not present

## 2017-08-20 DIAGNOSIS — Z87891 Personal history of nicotine dependence: Secondary | ICD-10-CM | POA: Insufficient documentation

## 2017-08-20 DIAGNOSIS — Z79899 Other long term (current) drug therapy: Secondary | ICD-10-CM | POA: Diagnosis not present

## 2017-08-20 DIAGNOSIS — N200 Calculus of kidney: Secondary | ICD-10-CM | POA: Diagnosis not present

## 2017-08-20 LAB — BASIC METABOLIC PANEL
Anion gap: 9 (ref 5–15)
BUN: 23 mg/dL — AB (ref 6–20)
CHLORIDE: 103 mmol/L (ref 101–111)
CO2: 25 mmol/L (ref 22–32)
Calcium: 9.3 mg/dL (ref 8.9–10.3)
Creatinine, Ser: 1.43 mg/dL — ABNORMAL HIGH (ref 0.61–1.24)
GFR calc Af Amer: 56 mL/min — ABNORMAL LOW (ref >60)
GFR calc non Af Amer: 48 mL/min — ABNORMAL LOW (ref >60)
GLUCOSE: 136 mg/dL — AB (ref 65–99)
Potassium: 4.2 mmol/L (ref 3.5–5.1)
Sodium: 137 mmol/L (ref 135–145)

## 2017-08-20 LAB — URINALYSIS, COMPLETE (UACMP) WITH MICROSCOPIC
BILIRUBIN URINE: NEGATIVE
Glucose, UA: NEGATIVE mg/dL
KETONES UR: NEGATIVE mg/dL
LEUKOCYTES UA: NEGATIVE
NITRITE: NEGATIVE
PH: 5 (ref 5.0–8.0)
Protein, ur: NEGATIVE mg/dL
SPECIFIC GRAVITY, URINE: 1.024 (ref 1.005–1.030)

## 2017-08-20 LAB — CBC
HCT: 45.1 % (ref 40.0–52.0)
Hemoglobin: 15.5 g/dL (ref 13.0–18.0)
MCH: 30.6 pg (ref 26.0–34.0)
MCHC: 34.3 g/dL (ref 32.0–36.0)
MCV: 89.1 fL (ref 80.0–100.0)
Platelets: 201 10*3/uL (ref 150–440)
RBC: 5.07 MIL/uL (ref 4.40–5.90)
RDW: 13.8 % (ref 11.5–14.5)
WBC: 7.9 10*3/uL (ref 3.8–10.6)

## 2017-08-20 MED ORDER — MORPHINE SULFATE (PF) 4 MG/ML IV SOLN
4.0000 mg | Freq: Once | INTRAVENOUS | Status: AC
  Administered 2017-08-20: 4 mg via INTRAVENOUS

## 2017-08-20 MED ORDER — ONDANSETRON HCL 4 MG/2ML IJ SOLN
INTRAMUSCULAR | Status: AC
Start: 2017-08-20 — End: 2017-08-20
  Administered 2017-08-20: 4 mg via INTRAVENOUS
  Filled 2017-08-20: qty 2

## 2017-08-20 MED ORDER — MORPHINE SULFATE (PF) 4 MG/ML IV SOLN
INTRAVENOUS | Status: AC
  Administered 2017-08-20: 4 mg via INTRAVENOUS
  Filled 2017-08-20: qty 1

## 2017-08-20 MED ORDER — SODIUM CHLORIDE 0.9 % IV BOLUS
1000.0000 mL | Freq: Once | INTRAVENOUS | Status: AC
  Administered 2017-08-20: 1000 mL via INTRAVENOUS

## 2017-08-20 MED ORDER — ONDANSETRON HCL 4 MG/2ML IJ SOLN
4.0000 mg | Freq: Once | INTRAMUSCULAR | Status: AC
  Administered 2017-08-20: 4 mg via INTRAVENOUS

## 2017-08-20 MED ORDER — KETOROLAC TROMETHAMINE 30 MG/ML IJ SOLN
30.0000 mg | Freq: Once | INTRAMUSCULAR | Status: AC
  Administered 2017-08-20: 30 mg via INTRAVENOUS
  Filled 2017-08-20: qty 1

## 2017-08-20 NOTE — ED Notes (Signed)
Patient transported to CT 

## 2017-08-20 NOTE — ED Triage Notes (Signed)
Pt arrives to ED c/o R sided flank pain x few hours. Some nausea. No vomiting. Has not noticed any hematuria. Pt states "I think it's a kidney stone." alert, oriented, ambulatory. No hx of kidney stones.

## 2017-08-20 NOTE — ED Provider Notes (Signed)
[facility] Emergency Department Provider Note   First MD Initiated Contact with Patient 08/20/17 2326     (approximate)  I have reviewed the triage vital signs and the nursing notes.   HISTORY  Chief Complaint Flank Pain    HPI Zachary Petroni. is a 67 y.o. male presents to the emergency department acute onset of 10 out of 10 right flank pain with radiation into the groin which began at approximately 6:00 PM.  Patient admits to nausea however no vomiting.  Patient denies any dysuria or hematuria.  Patient denies any fever.  Patient denies any previous history of kidney stones. He was given IV morphine for presentation room and as such pain is improved current pain score is 5 out of 10.   Past Medical History:  Diagnosis Date  . Hypertension     Patient Active Problem List   Diagnosis Date Noted  . Hypertension     Past Surgical History:  Procedure Laterality Date  . CHOLECYSTECTOMY  06/07/1998    Prior to Admission medications   Medication Sig Start Date End Date Taking? Authorizing Provider  amLODipine (NORVASC) 5 MG tablet Take 1 tablet (5 mg total) by mouth daily. 06/02/17   Johnson, Megan P, DO  Ascorbic Acid (VITAMIN C) 1000 MG tablet Take 1,000 mg by mouth daily.    [provider]  aspirin 325 MG tablet Take 325 mg by mouth daily.    [provider]  Cholecalciferol (VITAMIN D3) 3000 units TABS Take 1,000 Units by mouth.    [provider]  Cyanocobalamin (VITAMIN B 12 PO) Take 3,000 mcg by mouth daily.    [provider]  Glucosamine-Chondroit-Vit C-Mn (GLUCOSAMINE 1500 COMPLEX PO) Take 1,500 mg by mouth 2 (two) times daily.    [provider]  Methylsulfonylmethane (MSM) 1500 MG TABS Take 1,500 mg by mouth 2 (two) times daily.    [provider]  Multiple Vitamin (MULTIVITAMIN) tablet Take 1 tablet by mouth daily.    [provider]  Omega-3 Fatty Acids (FISH OIL) 1200 MG  CAPS Take 1,200 mg by mouth 2 (two) times daily.    [provider]    Allergies No known drug allergies  Family History  Problem Relation Age of Onset  . Lung cancer Mother   . Hypertension Father   . Diabetes Father     Social History Social History   Tobacco Use  . Smoking status: Former Research scientist (life sciences)  . Smokeless tobacco: Never Used  Substance Use Topics  . Alcohol use: Yes    Alcohol/week: 1.2 - 1.8 oz    Types: 2 - 3 Shots of liquor per week  . Drug use: No    Review of Systems Constitutional: No fever/chills Eyes: No visual changes. ENT: No sore throat. Cardiovascular: Denies chest pain. Respiratory: Denies shortness of breath. Gastrointestinal: No abdominal pain.  No nausea, no vomiting.  No diarrhea.  No constipation. Genitourinary: Negative for dysuria. Musculoskeletal: Negative for neck pain.  Positive for back pain. Integumentary: Negative for rash. Neurological: Negative for headaches, focal weakness or numbness.   ____________________________________________   PHYSICAL EXAM:  VITAL SIGNS: ED Triage Vitals  Enc Vitals Group     BP 08/20/17 2052 (!) 197/79     Pulse Rate 08/20/17 2052 (!) 57     Resp 08/20/17 2052 16     Temp 08/20/17 2052 97.6 F (36.4 C)     Temp Source 08/20/17 2052 Oral  SpO2 08/20/17 2052 97 %     Weight 08/20/17 2055 115.2 kg (254 lb)     Height 08/20/17 2055 1.905 m (6\' 3" )     Head Circumference --      Peak Flow --      Pain Score 08/20/17 2055 8     Pain Loc --      Pain Edu? --      Excl. in [place]? --     Constitutional: Alert and oriented. Well appearing and in no acute distress. Eyes: Conjunctivae are normal.  Head: Atraumatic. Mouth/Throat: Mucous membranes are moist.  Oropharynx non-erythematous. Neck: No stridor.   Cardiovascular: Normal rate, regular rhythm. Good peripheral circulation. Grossly normal heart sounds. Respiratory: Normal respiratory effort.  No retractions. Lungs  CTAB. Gastrointestinal: Soft and nontender. No distention.  Musculoskeletal: No lower extremity tenderness nor edema. No gross deformities of extremities. Neurologic:  Normal speech and language. No gross focal neurologic deficits are appreciated.  Skin:  Skin is warm, dry and intact. No rash noted. Psychiatric: Mood and affect are normal. Speech and behavior are normal.  ____________________________________________   LABS (all labs ordered are listed, but only abnormal results are displayed)  Labs Reviewed  URINALYSIS, COMPLETE (UACMP) WITH MICROSCOPIC - Abnormal; Notable for the following components:      Result Value   Color, Urine YELLOW (*)    APPearance CLEAR (*)    Hgb urine dipstick MODERATE (*)    Bacteria, UA RARE (*)    Squamous Epithelial / LPF 0-5 (*)    All other components within normal limits  BASIC METABOLIC PANEL - Abnormal; Notable for the following components:   Glucose, Bld 136 (*)    BUN 23 (*)    Creatinine, Ser 1.43 (*)    GFR calc non Af Amer 48 (*)    GFR calc Af Amer 56 (*)    All other components within normal limits  CBC     RADIOLOGY I, [place] Dorleen Kissel, personally viewed and evaluated these images (plain radiographs) as part of my medical decision making, as well as reviewing the written report by the radiologist.  ED MD interpretation: 5 mm right UVJ kidney stone additional stones noted bilaterally.  Patient also has pulmonary nodules.  As per radiology  Official radiology report(s): Ct Renal Stone Study  Result Date: 08/20/2017 CLINICAL DATA:  Right flank pain EXAM: CT ABDOMEN AND PELVIS WITHOUT CONTRAST TECHNIQUE: Multidetector CT imaging of the abdomen and pelvis was performed following the standard protocol without IV contrast. COMPARISON:  None. FINDINGS: Lower chest: Lung bases show no acute consolidation or pleural effusion. Multiple pulmonary nodules are visualized. Subpleural right middle lobe 5 mm nodule, series 4, image number 7. 2 mm  subpleural right lower lobe pulmonary nodule series 4, image number 8 and series 4, image number 13. Anterior right middle lobe 2 mm pulmonary nodule series 4, image number 9. 2 mm left lower lobe pulmonary nodule, series 4, image number 15. Mild coronary artery calcification. Hepatobiliary: Status post cholecystectomy. No focal hepatic abnormality. Enlarged extrahepatic bile duct measuring up to 13 mm likely due to surgical change. Pancreas: Unremarkable. No pancreatic ductal dilatation or surrounding inflammatory changes. Spleen: Normal in size without focal abnormality. Adrenals/Urinary Tract: Adrenal glands are within normal limits. Intrarenal stones bilaterally. Mild right perinephric fat stranding. Mild right hydronephrosis and hydroureter, secondary to a 5 mm stone at the right UVJ. Stomach/Bowel: Stomach is within normal limits. Appendix appears normal. No evidence of bowel wall thickening, distention,  or inflammatory changes. Sigmoid colon diverticular disease without acute inflammation Vascular/Lymphatic: Mild aortic atherosclerosis. No aneurysmal dilatation. No significantly enlarged lymph nodes. Reproductive: Prostate unremarkable. Clips in the bilateral scrotum. Other: Negative for free air or free fluid. Small fat in the umbilical region. Musculoskeletal: Degenerative changes. No acute or suspicious abnormality IMPRESSION: 1. Mild right hydronephrosis and hydroureter, secondary to a 5 mm stone at the right UVJ 2. Additional stones present within the right left kidneys 3 multiple lower lung pulmonary nodules. No follow-up needed if patient is low-risk (and has no known or suspected primary neoplasm). Non-contrast chest CT can be considered in 12 months if patient is high-risk. This recommendation follows the consensus statement: Guidelines for Management of Incidental Pulmonary Nodules Detected on CT Images: From the Fleischner Society 2017; Radiology 2017; 284:228-243. 3. Sigmoid colon diverticular  disease without acute inflammation Electronically Signed   By: Donavan Foil M.D.   On: 08/20/2017 23:04     Procedures   ____________________________________________   INITIAL IMPRESSION / ASSESSMENT AND PLAN / ED COURSE  As part of my medical decision making, I reviewed the following data within the electronic MEDICAL RECORD NUMBER   67 year old male presenting with above-stated history and physical exam consistent with possible right ureterolithiasis which was confirmed with CT scan.  Patient 5 mm right UVJ stone.  Patient was given IV morphine and subsequently IV Toradol with a liter of fluid with complete resolution of pain at present. ____________________________________________  FINAL CLINICAL IMPRESSION(S) / ED DIAGNOSES  Final diagnoses:  Kidney stone on right side     MEDICATIONS GIVEN DURING THIS VISIT:  Medications  ketorolac (TORADOL) 30 MG/ML injection 30 mg (has no administration in time range)  sodium chloride 0.9 % bolus 1,000 mL (has no administration in time range)  morphine 4 MG/ML injection 4 mg (4 mg Intravenous Given 08/20/17 2231)  ondansetron (ZOFRAN) injection 4 mg (4 mg Intravenous Given 08/20/17 2230)     ED Discharge Orders    None       Note:  This document was prepared using Dragon voice recognition software and may include unintentional dictation errors.    Gregor Hams, MD 08/21/17 [phone]

## 2017-08-20 NOTE — ED Notes (Signed)
Patient c/o right flank pain radiating to right lower abdomen and penis. Patient reports frequent urge to urinate to urinate with decreased urine output. Patient reports pain began approx 4 hours ago.

## 2017-08-21 MED ORDER — ONDANSETRON 4 MG PO TBDP: 4.0000 mg | ORAL_TABLET | Freq: Three times a day (TID) | ORAL | 0 refills | Status: DC | PRN

## 2017-08-21 MED ORDER — OXYCODONE-ACETAMINOPHEN 5-325 MG PO TABS: 1.0000 | ORAL_TABLET | ORAL | 0 refills | Status: DC | PRN

## 2017-08-24 ENCOUNTER — Ambulatory Visit (INDEPENDENT_AMBULATORY_CARE_PROVIDER_SITE_OTHER): Payer: Self-pay | Admitting: Family Medicine

## 2017-08-24 ENCOUNTER — Encounter: Payer: Self-pay | Admitting: Family Medicine

## 2017-08-24 VITALS — BP 116/76 | HR 57 | Ht 74.5 in | Wt 259.0 lb

## 2017-08-24 DIAGNOSIS — N200 Calculus of kidney: Secondary | ICD-10-CM

## 2017-08-24 DIAGNOSIS — I10 Essential (primary) hypertension: Secondary | ICD-10-CM

## 2017-08-24 NOTE — Progress Notes (Signed)
BP 116/76   Pulse (!) 57   Ht 6' 2.5" (1.892 m)   Wt 259 lb (117.5 kg)   SpO2 98%   BMI 32.81 kg/m    Subjective:    Patient ID: Zachary Ley., male    DOB: Jan 23, 1951, 67 y.o.   MRN: [id number]  HPI: Zachary Rogers is a 67 y.o. male  Chief Complaint  Patient presents with  . Flight    2nd class   Patient 6 days ago developed right kidney right flank pain with subsequent diagnosis of 5 mm kidney stone.  This was subsequently passed at least into the bladder.  Unfortunately she has retained kidney stones in both kidneys. Patient has remained asymptomatic and with no medications since.  Also noted on CT with some pulmonary nodules.  Checking with CT navigator for follow-up if needed.  Patient quit smoking 15 years ago and was never heavy smoker.  Blood pressures been well controlled with amlodipine and no side effects.  Relevant past medical, surgical, family and social history reviewed and updated as indicated. Interim medical history since our last visit reviewed. Allergies and medications reviewed and updated.  Review of Systems  Constitutional: Negative.   HENT: Negative.   Eyes: Negative.   Respiratory: Negative.   Cardiovascular: Negative.   Gastrointestinal: Negative.   Endocrine: Negative.   Genitourinary: Negative.   Musculoskeletal: Negative.   Skin: Negative.   Allergic/Immunologic: Negative.   Neurological: Negative.   Hematological: Negative.   Psychiatric/Behavioral: Negative.     Per HPI unless specifically indicated above     Objective:    BP 116/76   Pulse (!) 57   Ht 6' 2.5" (1.892 m)   Wt 259 lb (117.5 kg)   SpO2 98%   BMI 32.81 kg/m   Wt Readings from Last 3 Encounters:  08/24/17 259 lb (117.5 kg)  08/20/17 254 lb (115.2 kg)  06/19/17 258 lb 8 oz (117.3 kg)    Physical Exam  Constitutional: He is oriented to person, place, and time. He appears well-developed and well-nourished.  HENT:  Head: Normocephalic.  Right Ear:  External ear normal.  Left Ear: External ear normal.  Nose: Nose normal.  Eyes: Pupils are equal, round, and reactive to light. Conjunctivae and EOM are normal.  Neck: Normal range of motion. Neck supple. No thyromegaly present.  Cardiovascular: Normal rate, regular rhythm, normal heart sounds and intact distal pulses.  Pulmonary/Chest: Effort normal and breath sounds normal.  Abdominal: Soft. Bowel sounds are normal. There is no splenomegaly or hepatomegaly.  Genitourinary: Penis normal.  Musculoskeletal: Normal range of motion.  Lymphadenopathy:    He has no cervical adenopathy.  Neurological: He is alert and oriented to person, place, and time. He has normal reflexes.  Skin: Skin is warm and dry.  Psychiatric: He has a normal mood and affect. His behavior is normal. Judgment and thought content normal.    Results for orders placed or performed during the hospital encounter of 08/20/17  Urinalysis, Complete w Microscopic  Result Value Ref Range   Color, Urine YELLOW (A) YELLOW   APPearance CLEAR (A) CLEAR   Specific Gravity, Urine 1.024 1.005 - 1.030   pH 5.0 5.0 - 8.0   Glucose, UA NEGATIVE NEGATIVE mg/dL   Hgb urine dipstick MODERATE (A) NEGATIVE   Bilirubin Urine NEGATIVE NEGATIVE   Ketones, ur NEGATIVE NEGATIVE mg/dL   Protein, ur NEGATIVE NEGATIVE mg/dL   Nitrite NEGATIVE NEGATIVE   Leukocytes, UA NEGATIVE NEGATIVE  RBC / HPF TOO NUMEROUS TO COUNT 0 - 5 RBC/hpf   WBC, UA 0-5 0 - 5 WBC/hpf   Bacteria, UA RARE (A) NONE SEEN   Squamous Epithelial / LPF 0-5 (A) NONE SEEN  Basic metabolic panel  Result Value Ref Range   Sodium 137 135 - 145 mmol/L   Potassium 4.2 3.5 - 5.1 mmol/L   Chloride 103 101 - 111 mmol/L   CO2 25 22 - 32 mmol/L   Glucose, Bld 136 (H) 65 - 99 mg/dL   BUN 23 (H) 6 - 20 mg/dL   Creatinine, Ser 1.43 (H) 0.61 - 1.24 mg/dL   Calcium 9.3 8.9 - 10.3 mg/dL   GFR calc non Af Amer 48 (L) >60 mL/min   GFR calc Af Amer 56 (L) >60 mL/min   Anion gap 9 5 -  15  CBC  Result Value Ref Range   WBC 7.9 3.8 - 10.6 K/uL   RBC 5.07 4.40 - 5.90 MIL/uL   Hemoglobin 15.5 13.0 - 18.0 g/dL   HCT 45.1 40.0 - 52.0 %   MCV 89.1 80.0 - 100.0 fL   MCH 30.6 26.0 - 34.0 pg   MCHC 34.3 32.0 - 36.0 g/dL   RDW 13.8 11.5 - 14.5 %   Platelets 201 150 - 440 K/uL      Assessment & Plan:   Problem List Items Addressed This Visit      Cardiovascular and Mediastinum   Hypertension - Primary    CACI hypertension        Genitourinary   Nephrolithiasis    Class 2 issuance pending results from urology evaluation          Follow up plan: Return for As scheduled.

## 2017-08-24 NOTE — Assessment & Plan Note (Signed)
Class 2 issuance pending results from urology evaluation

## 2017-08-24 NOTE — Assessment & Plan Note (Signed)
CACI hypertension

## 2017-08-25 ENCOUNTER — Encounter: Payer: Self-pay | Admitting: Family Medicine

## 2017-08-25 DIAGNOSIS — R918 Other nonspecific abnormal finding of lung field: Secondary | ICD-10-CM | POA: Insufficient documentation

## 2017-08-26 ENCOUNTER — Telehealth: Payer: Self-pay | Admitting: Family Medicine

## 2017-08-26 ENCOUNTER — Telehealth: Payer: Self-pay | Admitting: Urology

## 2017-08-26 DIAGNOSIS — N200 Calculus of kidney: Secondary | ICD-10-CM

## 2017-08-26 NOTE — Progress Notes (Addendum)
08/27/2017 11:58 AM   Zachary Rogers. 08/28/1950 [id number]  Referring provider: Valerie Roys, DO [place], [place] [address]  Chief Complaint  Patient presents with  . Follow-up    HPI: Patient is a 67 year old Caucasian male with a history of nephrolithiasis who presents today as a referral from Dr. Jeananne Rama.  He presented to [facility] ED on August 20, 2017 with a complaint of sudden onset of right-sided flank pain which radiated into the groin with a 10 out of 10 intensity.   CT Renal stone study on 08/20/2017 noted mild right hydronephrosis and hydroureter, secondary to a 5 mm stone at the right UVJ.   Additional stones present within the right left kidneys.  His UA was positive for TNTC RBC's.  WBC count was 7.9.  Serum creatinine was 1.43.  He has subsequently passed that stone   Today, he is having no flank discomfort.  He is not had any gross hematuria.  He is not had any difficulty with urination.  Patient denies any fevers, chills, nausea or vomiting.   His UA today is positive for 6-10 RBC's.    KUB today (08/26/2017) the right UVJ stone is no longer visible.   Very small punctate stone is located in the lower pole of each kidney.    He does not have a prior history of nephrolithiasis.   PMH: Past Medical History:  Diagnosis Date  . Hypertension     Surgical History: Past Surgical History:  Procedure Laterality Date  . CHOLECYSTECTOMY  06/07/1998    Home Medications:  Allergies as of 08/27/2017   No Known Allergies     Medication List        Accurate as of 08/27/17 11:58 AM. Always use your most recent med list.          amLODipine 5 MG tablet Commonly known as:  NORVASC Take 1 tablet (5 mg total) by mouth daily.   aspirin 325 MG tablet Take 325 mg by mouth daily.   Fish Oil 1200 MG Caps Take 1,200 mg by mouth 2 (two) times daily.   GLUCOSAMINE 1500 COMPLEX PO Take 1,500 mg by mouth 2 (two) times daily.   MSM 1500 MG Tabs Take 1,500  mg by mouth 2 (two) times daily.   multivitamin tablet Take 1 tablet by mouth daily.   oxyCODONE-acetaminophen 5-325 MG tablet Commonly known as:  PERCOCET Take 1 tablet by mouth every 4 (four) hours as needed for severe pain.   VITAMIN B 12 PO Take 3,000 mcg by mouth daily.   vitamin C 1000 MG tablet Take 1,000 mg by mouth daily.   Vitamin D3 3000 units Tabs Take 1,000 Units by mouth.       Allergies: No Known Allergies  Family History: Family History  Problem Relation Age of Onset  . Lung cancer Mother   . Hypertension Father   . Diabetes Father   . Prostate cancer Neg Hx   . Bladder Cancer Neg Hx   . Kidney cancer Neg Hx     Social History:  reports that he has quit smoking. He has never used smokeless tobacco. He reports that he drinks about 1.2 - 1.8 oz of alcohol per week. He reports that he does not use drugs.  ROS: UROLOGY Frequent Urination?: No Hard to postpone urination?: No Burning/pain with urination?: No Get up at night to urinate?: No Leakage of urine?: No Urine stream starts and stops?: No Trouble starting stream?:  No Do you have to strain to urinate?: No Blood in urine?: No Urinary tract infection?: No Sexually transmitted disease?: No Injury to kidneys or bladder?: No Painful intercourse?: No Weak stream?: No Erection problems?: No Penile pain?: No  Gastrointestinal Nausea?: No Vomiting?: No Indigestion/heartburn?: No Diarrhea?: Yes Constipation?: No  Constitutional Fever: No Night sweats?: No Weight loss?: No Fatigue?: No  Skin Skin rash/lesions?: No Itching?: No  Eyes Blurred vision?: No Double vision?: No  Ears/Nose/Throat Sore throat?: No Sinus problems?: No  Hematologic/Lymphatic Swollen glands?: No Easy bruising?: No  Cardiovascular Leg swelling?: No Chest pain?: No  Respiratory Cough?: No Shortness of breath?: No  Endocrine Excessive thirst?: No  Musculoskeletal Back pain?: No Joint pain?:  No  Neurological Headaches?: No Dizziness?: No  Psychologic Depression?: No Anxiety?: No  Physical Exam: BP 134/83 (BP Location: Right Arm, Patient Position: Sitting, Cuff Size: Large)   Pulse 60   Ht 6' 2.5" (1.892 m)   Wt 257 lb 1.6 oz (116.6 kg)   BMI 32.57 kg/m   Constitutional: Well nourished. Alert and oriented, No acute distress. HEENT: [place] AT, moist mucus membranes. Trachea midline, no masses. Cardiovascular: No clubbing, cyanosis, or edema. Respiratory: Normal respiratory effort, no increased work of breathing. Skin: No rashes, bruises or suspicious lesions. Lymph: No cervical or inguinal adenopathy. Neurologic: Grossly intact, no focal deficits, moving all 4 extremities. Psychiatric: Normal mood and affect.  Laboratory Data: Lab Results  Component Value Date   WBC 7.9 08/20/2017   HGB 15.5 08/20/2017   HCT 45.1 08/20/2017   MCV 89.1 08/20/2017   PLT 201 08/20/2017    Lab Results  Component Value Date   CREATININE 1.43 (H) 08/20/2017    No results found for: PSA  No results found for: TESTOSTERONE  No results found for: HGBA1C  Lab Results  Component Value Date   TSH 1.870 06/02/2017       Component Value Date/Time   CHOL 193 06/02/2017 1157   HDL 64 06/02/2017 1157   LDLCALC 104 (H) 06/02/2017 1157    Lab Results  Component Value Date   AST 20 06/02/2017   Lab Results  Component Value Date   ALT 32 06/02/2017   No components found for: ALKALINEPHOPHATASE No components found for: BILIRUBINTOTAL  No results found for: ESTRADIOL  Urinalysis 6-10 RBC's.  See Epic.    I have reviewed the labs.   Pertinent Imaging: CLINICAL DATA:  Right flank pain  EXAM: CT ABDOMEN AND PELVIS WITHOUT CONTRAST  TECHNIQUE: Multidetector CT imaging of the abdomen and pelvis was performed following the standard protocol without IV contrast.  COMPARISON:  None.  FINDINGS: Lower chest: Lung bases show no acute consolidation or  pleural effusion. Multiple pulmonary nodules are visualized. Subpleural right middle lobe 5 mm nodule, series 4, image number 7. 2 mm subpleural right lower lobe pulmonary nodule series 4, image number 8 and series 4, image number 13. Anterior right middle lobe 2 mm pulmonary nodule series 4, image number 9. 2 mm left lower lobe pulmonary nodule, series 4, image number 15. Mild coronary artery calcification.  Hepatobiliary: Status post cholecystectomy. No focal hepatic abnormality. Enlarged extrahepatic bile duct measuring up to 13 mm likely due to surgical change.  Pancreas: Unremarkable. No pancreatic ductal dilatation or surrounding inflammatory changes.  Spleen: Normal in size without focal abnormality.  Adrenals/Urinary Tract: Adrenal glands are within normal limits. Intrarenal stones bilaterally. Mild right perinephric fat stranding. Mild right hydronephrosis and hydroureter, secondary to a 5 mm stone at  the right UVJ.  Stomach/Bowel: Stomach is within normal limits. Appendix appears normal. No evidence of bowel wall thickening, distention, or inflammatory changes. Sigmoid colon diverticular disease without acute inflammation  Vascular/Lymphatic: Mild aortic atherosclerosis. No aneurysmal dilatation. No significantly enlarged lymph nodes.  Reproductive: Prostate unremarkable. Clips in the bilateral scrotum.  Other: Negative for free air or free fluid. Small fat in the umbilical region.  Musculoskeletal: Degenerative changes. No acute or suspicious abnormality  IMPRESSION: 1. Mild right hydronephrosis and hydroureter, secondary to a 5 mm stone at the right UVJ 2. Additional stones present within the right left kidneys 3 multiple lower lung pulmonary nodules. No follow-up needed if patient is low-risk (and has no known or suspected primary neoplasm). Non-contrast chest CT can be considered in 12 months if patient is high-risk. This recommendation follows  the consensus statement: Guidelines for Management of Incidental Pulmonary Nodules Detected on CT Images: From the Fleischner Society 2017; Radiology 2017; 284:228-243. 3. Sigmoid colon diverticular disease without acute inflammation   Electronically Signed   By: Donavan Foil M.D.   On: 08/20/2017 23:04 I have independently reviewed the films.    Assessment & Plan:    1. Right ureteral stone Right UVJ stone has passed most likely in the emergency room  2. Right hydronephrosis  - obtain RUS to ensure the hydronephrosis has resolved     3. Microscopic hematuria  - UA today demonstrates 6-10 RBC's.  Hematuria most likely due to passed stone  - continue to monitor the patient's UA after the treatment/passage of the stone to ensure the hematuria has resolved  - if hematuria persists, we will pursue a hematuria workup with CT Urogram and cystoscopy if appropriate.  4. Bilateral punctate stones No intervention warranted at this time as patient is asymptomatic and the 2 punctate renal stones do not present a risk for a sudden incapacitating event Offered a 26-ZTIW metabolic workup as he has a history of stones  Return for pending RUS and [employer]ol[employer] results.  These notes generated with voice recognition software. I apologize for typographical errors.  Zara Council, PA-C  [facility] [address] [place],  [address] [phone]

## 2017-08-26 NOTE — Telephone Encounter (Signed)
I will need the patient to have a KUB prior to his visit tomorrow after.  He can go today or before his appointment.

## 2017-08-26 NOTE — Telephone Encounter (Signed)
-----   Message from Lieutenant Diego, RN sent at 08/26/2017  8:56 AM EDT ----- Regarding: RE: chest nodules on ct He wouldn't automatically be followed as a lung cancer screening program patient would since he doesn't meet the eligibility for that. For now please put a reminder to order this. I've also put a reminder for myself to look at him around the first of the year.  There has been some interest in developing a lung nodule clinic that would follow and manage the many nodules that are incidentally seen that are likely nothing but require follow up to make sure. I'll let you know if that is something that becomes reality. Thanks, Shawn ----- Message ----- From: Valerie Roys, DO Sent: 08/25/2017   5:08 PM To: Guadalupe Maple, MD, Lieutenant Diego, RN Subject: RE: chest nodules on ct                        [facility], thanks for looking into that. Is that something you guys will order/watch or should I send myself a reminder to repeat it in 12 months? -- Cherene Julian ----- Message ----- From: Guadalupe Maple, MD Sent: 08/25/2017   5:00 PM To: Valerie Roys, DO Subject: FW: chest nodules on ct                        Meg I am not sure  if this went to you or not.  Do you want to handle this? Mark ----- Message ----- From: Lieutenant Diego, RN Sent: 08/24/2017   3:56 PM To: Guadalupe Maple, MD Subject: RE: chest nodules on ct                        Dr. Jeananne Rama, I do not see any prior CT reports to compare to, so, since the patient appears to be pretty health, I would err on the side of caution and do at least one 12 month follow up noncontrast CT. That will confirm stability of the nodules seen.  Thanks, Shawn ----- Message ----- From: Guadalupe Maple, MD Sent: 08/24/2017   1:25 PM To: Megan Annia Friendly, DO, Shawn Trenton Gammon, RN Subject: chest nodules on ct                            Shawn, Please review this patient's chest CT with pulmonary nodules for follow-up recommendations. He  quit smoking approximately 15 years ago and was never a heavy smoker. Zachary Rogers

## 2017-08-27 ENCOUNTER — Ambulatory Visit
Admission: RE | Admit: 2017-08-27 | Discharge: 2017-08-27 | Disposition: A | Discharge status: Home / Self Care | Payer: Medicare Other | Source: Ambulatory Visit | Attending: Urology | Admitting: Urology

## 2017-08-27 ENCOUNTER — Telehealth: Payer: Self-pay | Admitting: Urology

## 2017-08-27 ENCOUNTER — Encounter: Payer: Self-pay | Admitting: Urology

## 2017-08-27 ENCOUNTER — Ambulatory Visit (INDEPENDENT_AMBULATORY_CARE_PROVIDER_SITE_OTHER): Payer: Medicare Other | Admitting: Urology

## 2017-08-27 VITALS — BP 134/83 | HR 60 | Ht 74.5 in | Wt 257.1 lb

## 2017-08-27 DIAGNOSIS — N200 Calculus of kidney: Secondary | ICD-10-CM | POA: Insufficient documentation

## 2017-08-27 DIAGNOSIS — R3129 Other microscopic hematuria: Secondary | ICD-10-CM | POA: Diagnosis not present

## 2017-08-27 DIAGNOSIS — N132 Hydronephrosis with renal and ureteral calculous obstruction: Secondary | ICD-10-CM

## 2017-08-27 DIAGNOSIS — N201 Calculus of ureter: Secondary | ICD-10-CM | POA: Diagnosis not present

## 2017-08-27 LAB — URINALYSIS, COMPLETE
Bilirubin, UA: NEGATIVE
GLUCOSE, UA: NEGATIVE
KETONES UA: NEGATIVE
Leukocytes, UA: NEGATIVE
NITRITE UA: NEGATIVE
PROTEIN UA: NEGATIVE
SPEC GRAV UA: 1.015 (ref 1.005–1.030)
UUROB: 0.2 mg/dL (ref 0.2–1.0)
pH, UA: 6 (ref 5.0–7.5)

## 2017-08-27 LAB — MICROSCOPIC EXAMINATION
Bacteria, UA: NONE SEEN
Epithelial Cells (non renal): NONE SEEN /hpf (ref 0–10)
WBC UA: NONE SEEN /HPF (ref 0–5)

## 2017-08-27 NOTE — Telephone Encounter (Signed)
Would you order a Litholink for this patient?  

## 2017-08-27 NOTE — Telephone Encounter (Signed)
Litholink ordered

## 2017-08-27 NOTE — Telephone Encounter (Signed)
LMOM- needs KUB prior to appt and orders have been placed.

## 2017-09-08 ENCOUNTER — Ambulatory Visit
Admission: RE | Admit: 2017-09-08 | Discharge: 2017-09-08 | Disposition: A | Discharge status: Home / Self Care | Payer: Medicare Other | Source: Ambulatory Visit | Attending: Urology | Admitting: Urology

## 2017-09-08 DIAGNOSIS — N201 Calculus of ureter: Secondary | ICD-10-CM

## 2017-09-09 ENCOUNTER — Telehealth: Payer: Self-pay | Admitting: Family Medicine

## 2017-09-09 NOTE — Telephone Encounter (Signed)
-----   Message from Nori Riis, PA-C sent at 09/09/2017 10:13 AM EDT ----- Would you please let Mr. Dominica Severin know that his kidney is no longer swollen and has recovered from the stone?

## 2017-09-09 NOTE — Telephone Encounter (Signed)
Patient notified

## 2017-10-06 DIAGNOSIS — L570 Actinic keratosis: Secondary | ICD-10-CM | POA: Diagnosis not present

## 2017-10-06 DIAGNOSIS — D225 Melanocytic nevi of trunk: Secondary | ICD-10-CM | POA: Diagnosis not present

## 2017-10-06 DIAGNOSIS — L821 Other seborrheic keratosis: Secondary | ICD-10-CM | POA: Diagnosis not present

## 2017-10-06 DIAGNOSIS — L988 Other specified disorders of the skin and subcutaneous tissue: Secondary | ICD-10-CM | POA: Diagnosis not present

## 2017-10-13 DIAGNOSIS — D225 Melanocytic nevi of trunk: Secondary | ICD-10-CM | POA: Diagnosis not present

## 2018-01-14 DIAGNOSIS — D1800 Hemangioma unspecified site: Secondary | ICD-10-CM | POA: Diagnosis not present

## 2018-01-14 DIAGNOSIS — L821 Other seborrheic keratosis: Secondary | ICD-10-CM | POA: Diagnosis not present

## 2018-01-14 DIAGNOSIS — D2262 Melanocytic nevi of left upper limb, including shoulder: Secondary | ICD-10-CM | POA: Diagnosis not present

## 2018-01-14 DIAGNOSIS — D485 Neoplasm of uncertain behavior of skin: Secondary | ICD-10-CM | POA: Diagnosis not present

## 2018-01-14 DIAGNOSIS — D225 Melanocytic nevi of trunk: Secondary | ICD-10-CM | POA: Diagnosis not present

## 2018-01-14 DIAGNOSIS — L570 Actinic keratosis: Secondary | ICD-10-CM | POA: Diagnosis not present

## 2018-01-14 DIAGNOSIS — L918 Other hypertrophic disorders of the skin: Secondary | ICD-10-CM | POA: Diagnosis not present

## 2018-01-14 DIAGNOSIS — L578 Other skin changes due to chronic exposure to nonionizing radiation: Secondary | ICD-10-CM | POA: Diagnosis not present

## 2018-01-14 DIAGNOSIS — L812 Freckles: Secondary | ICD-10-CM | POA: Diagnosis not present

## 2018-01-14 DIAGNOSIS — Z1283 Encounter for screening for malignant neoplasm of skin: Secondary | ICD-10-CM | POA: Diagnosis not present

## 2018-05-21 ENCOUNTER — Other Ambulatory Visit: Payer: Self-pay | Admitting: Family Medicine

## 2018-05-21 NOTE — Telephone Encounter (Signed)
Requested medication (s) are due for refill today: yes  Requested medication (s) are on the active medication list: yes  Last refill:  06/02/17 for 30 tabs and 3 refills  Future visit scheduled: yes  Notes to clinic: cardiovascular: Calcium Channel Blockers Failed  Requested Prescriptions  Pending Prescriptions Disp Refills   amLODipine (NORVASC) 5 MG tablet [Pharmacy Med Name: AMLODIPINE BESYLATE TABS 5MG ] 90 tablet 4    Sig: TAKE 1 TABLET DAILY     Cardiovascular:  Calcium Channel Blockers Failed - 05/21/2018  8:18 AM      Failed - Valid encounter within last 6 months    Recent Outpatient Visits          9 months ago Essential hypertension   [place]iss[place], Jeannette How, MD   11 months ago Medicare annual wellness visit, subsequent   [employer], [place], DO   11 months ago Essential hypertension   [place], Megan P, DO   1 year ago History and physical examination, occupation   [place], Jeannette How, MD   1 year ago Essential hypertension   [place], [place], DO      Future Appointments            In 1 month Johnson, Megan P, DO [place]           Passed - Last BP in normal range    BP Readings from Last 1 Encounters:  08/27/17 134/83

## 2018-06-21 ENCOUNTER — Encounter: Payer: TRICARE For Life (TFL) | Admitting: Family Medicine

## 2018-06-21 NOTE — Progress Notes (Deleted)
There were no vitals taken for this visit.   Subjective:    Patient ID: Zachary Ley., male    DOB: [birth year], 68 y.o.   MRN: [id number]  HPI: Zachary Vanderloop. is a 68 y.o. male presenting on 06/21/2018 for comprehensive medical examination. Current medical complaints include:  HYPERTENSION Hypertension status: {Blank single:19197::"controlled","uncontrolled","better","worse","exacerbated","stable"}  Satisfied with current treatment? {Blank single:19197::"yes","no"} Duration of hypertension: {Blank single:19197::"chronic","months","years"} BP monitoring frequency:  {Blank single:19197::"not checking","rarely","daily","weekly","monthly","a few times a day","a few times a week","a few times a month"} BP range:  BP medication side effects:  {Blank single:19197::"yes","no"} Medication compliance: {Blank single:19197::"excellent compliance","good compliance","fair compliance","poor compliance"} Previous BP meds:{Blank SPQZRAQT:62263::"FHLK","TGYBWLSLHT","DSKAJGOTLX/BWIOMBTDHR","CBULAGTX","MIWOEHOZYY","QMGNOIBBCW/UGQB","VQXIHWTUUE (bystolic)","carvedilol","chlorthalidone","clonidine","diltiazem","exforge HCT","HCTZ","irbesartan (avapro)","labetalol","lisinopril","lisinopril-HCTZ","losartan (cozaar)","methyldopa","nifedipine","olmesartan (benicar)","olmesartan-HCTZ","quinapril","ramipril","spironalactone","tekturna","valsartan","valsartan-HCTZ","verapamil"} Aspirin: {Blank single:19197::"yes","no"} Recurrent headaches: {Blank single:19197::"yes","no"} Visual changes: {Blank single:19197::"yes","no"} Palpitations: {Blank single:19197::"yes","no"} Dyspnea: {Blank single:19197::"yes","no"} Chest pain: {Blank single:19197::"yes","no"} Lower extremity edema: {Blank single:19197::"yes","no"} Dizzy/lightheaded: {Blank single:19197::"yes","no"}  He currently lives with: Interim Problems from his last visit: {Blank single:19197::"yes","no"}  Functional Status Survey:    FALL RISK: Fall Risk   06/19/2017 06/02/2017 06/09/2016  Falls in the past year? No No No    Depression Screen Depression screen [facility] 2/9 06/19/2017 06/02/2017 06/09/2016  Decreased Interest 0 0 0  Down, Depressed, Hopeless 0 0 0  PHQ - 2 Score 0 0 0    Advanced Directives Paperwork given today  Past Medical History:  Past Medical History:  Diagnosis Date  . Hypertension     Surgical History:  Past Surgical History:  Procedure Laterality Date  . CHOLECYSTECTOMY  06/07/1998    Medications:  Current Outpatient Medications on File Prior to Visit  Medication Sig  . amLODipine (NORVASC) 5 MG tablet TAKE 1 TABLET DAILY  . Ascorbic Acid (VITAMIN C) 1000 MG tablet Take 1,000 mg by mouth daily.  Marland Kitchen aspirin 325 MG tablet Take 325 mg by mouth daily.  . Cholecalciferol (VITAMIN D3) 3000 units TABS Take 1,000 Units by mouth.  . Cyanocobalamin (VITAMIN B 12 PO) Take 3,000 mcg by mouth daily.  . Glucosamine-Chondroit-Vit C-Mn (GLUCOSAMINE 1500 COMPLEX PO) Take 1,500 mg by mouth 2 (two) times daily.  . Methylsulfonylmethane (MSM) 1500 MG TABS Take 1,500 mg by mouth 2 (two) times daily.  . Multiple Vitamin (MULTIVITAMIN) tablet Take 1 tablet by mouth daily.  . Omega-3 Fatty Acids (FISH OIL) 1200 MG CAPS Take 1,200 mg by mouth 2 (two) times daily.  Marland Kitchen oxyCODONE-acetaminophen (PERCOCET) 5-325 MG tablet Take 1 tablet by mouth every 4 (four) hours as needed for severe pain. (Patient not taking: Reported on 08/27/2017)   No current facility-administered medications on file prior to visit.     Allergies:  No Known Allergies  Social History:  Social History   Socioeconomic History  . Marital status: Married    Spouse name: Not on file  . Number of children: Not on file  . Years of education: Not on file  . Highest education level: Not on file  Occupational History  . Not on file  Social Needs  . Financial resource strain: Not on file  . Food insecurity:    Worry: Not on file    Inability: Not on file  .  Transportation needs:    Medical: Not on file    Non-medical: Not on file  Tobacco Use  . Smoking status: Former Research scientist (life sciences)  . Smokeless tobacco: Never Used  Substance and Sexual Activity  . Alcohol use: Yes    Alcohol/week: 2.0 - 3.0 standard drinks    Types: 2 - 3 Shots of liquor per week  .  Drug use: No  . Sexual activity: Yes  Lifestyle  . Physical activity:    Days per week: Not on file    Minutes per session: Not on file  . Stress: Not on file  Relationships  . Social connections:    Talks on phone: Not on file    Gets together: Not on file    Attends religious service: Not on file    Active member of club or organization: Not on file    Attends meetings of clubs or organizations: Not on file    Relationship status: Not on file  . Intimate partner violence:    Fear of current or ex partner: Not on file    Emotionally abused: Not on file    Physically abused: Not on file    Forced sexual activity: Not on file  Other Topics Concern  . Not on file  Social History Narrative  . Not on file   Social History   Tobacco Use  Smoking Status Former Smoker  Smokeless Tobacco Never Used   Social History   Substance and Sexual Activity  Alcohol Use Yes  . Alcohol/week: 2.0 - 3.0 standard drinks  . Types: 2 - 3 Shots of liquor per week    Family History:  Family History  Problem Relation Age of Onset  . Lung cancer Mother   . Hypertension Father   . Diabetes Father   . Prostate cancer Neg Hx   . Bladder Cancer Neg Hx   . Kidney cancer Neg Hx     Past medical history, surgical history, medications, allergies, family history and social history reviewed with patient today and changes made to appropriate areas of the chart.   ROS  All other ROS negative except what is listed above and in the HPI.      Objective:    There were no vitals taken for this visit.  Wt Readings from Last 3 Encounters:  08/27/17 257 lb 1.6 oz (116.6 kg)  08/24/17 259 lb (117.5 kg)    08/20/17 254 lb (115.2 kg)    Physical Exam  Cognitive Testing - 6-CIT  Correct? Score   What year is it? {YES NO:22349} {Numbers; 0-4:31231} Yes = 0    No = 4  What month is it? {YES NO:22349} {Numbers; 0-4:31231} Yes = 0    No = 3  Remember:     Pia Mau, [place], [place]     What time is it? {YES NO:22349} {Numbers; 0-4:31231} Yes = 0    No = 3  Count backwards from 20 to 1 {YES NO:22349} {Numbers; 0-4:31231} Correct = 0    1 error = 2   More than 1 error = 4  Say the months of the year in reverse. {YES NO:22349} {Numbers; 0-4:31231} Correct = 0    1 error = 2   More than 1 error = 4  What address did I ask you to remember? {YES NO:22349} {NUMBERS; 0-10:5044} Correct = 0  1 error = 2    2 error = 4    3 error = 6    4 error = 8    All wrong = 10       TOTAL SCORE  {Numbers; 0-10:27253}/66   Interpretation:  {Desc; normal/abnormal:11317::"Normal"}  Normal (0-7) Abnormal (8-28)    Results for orders placed or performed in visit on 08/27/17  Microscopic Examination  Result Value Ref Range   WBC, UA None seen 0 - 5 /  hpf   RBC, UA 3-10 (A) 0 - 2 /hpf   Epithelial Cells (non renal) None seen 0 - 10 /hpf   Bacteria, UA None seen None seen/Few  Urinalysis, Complete  Result Value Ref Range   Specific Gravity, UA 1.015 1.005 - 1.030   pH, UA 6.0 5.0 - 7.5   Color, UA Yellow Yellow   Appearance Ur Clear Clear   Leukocytes, UA Negative Negative   Protein, UA Negative Negative/Trace   Glucose, UA Negative Negative   Ketones, UA Negative Negative   RBC, UA Trace (A) Negative   Bilirubin, UA Negative Negative   Urobilinogen, Ur 0.2 0.2 - 1.0 mg/dL   Nitrite, UA Negative Negative   Microscopic Examination See below:       Assessment & Plan:   Problem List Items Addressed This Visit      Cardiovascular and Mediastinum   Hypertension     Genitourinary   Nephrolithiasis    Other Visit Diagnoses    Medicare annual wellness visit, subsequent    -  Primary   Routine  general medical examination at a health care facility       Screening for prostate cancer       Screening for cholesterol level           Preventative Services:  Health Risk Assessment and Personalized Prevention Plan: Bone Mass Measurements: CVD Screening:  Colon Cancer Screening:  Depression Screening:  Diabetes Screening:  Glaucoma Screening:  Hepatitis B vaccine: Hepatitis C screening:  HIV Screening: Flu Vaccine: Lung cancer Screening: Obesity Screening:  Pneumonia Vaccines (2): STI Screening: PSA screening:  Discussed aspirin prophylaxis for myocardial infarction prevention and decision was made to continue ASA  LABORATORY TESTING:  Health maintenance labs ordered today as discussed above.   The natural history of prostate cancer and ongoing controversy regarding screening and potential treatment outcomes of prostate cancer has been discussed with the patient. The meaning of a false positive PSA and a false negative PSA has been discussed. He indicates understanding of the limitations of this screening test and wishes to proceed with screening PSA testing.   IMMUNIZATIONS:   - Tdap: Tetanus vaccination status reviewed: last tetanus booster within 10 years. - Influenza: {Blank single:19197::"Up to date","Administered today","Postponed to flu season","Refused","Given elsewhere"} - Pneumovax: Up to date - Prevnar: Up to date  SCREENING: - Colonoscopy: Up to date  Discussed with patient purpose of the colonoscopy is to detect colon cancer at curable precancerous or early stages   PATIENT COUNSELING:    Sexuality: Discussed sexually transmitted diseases, partner selection, use of condoms, avoidance of unintended pregnancy  and contraceptive alternatives.   Advised to avoid cigarette smoking.  I discussed with the patient that most people either abstain from alcohol or drink within safe limits (<=14/week and <=4 drinks/occasion for males, <=7/weeks and <= 3  drinks/occasion for females) and that the risk for alcohol disorders and other health effects rises proportionally with the number of drinks per week and how often a drinker exceeds daily limits.  Discussed cessation/primary prevention of drug use and availability of treatment for abuse.   Diet: Encouraged to adjust caloric intake to maintain  or achieve ideal body weight, to reduce intake of dietary saturated fat and total fat, to limit sodium intake by avoiding high sodium foods and not adding table salt, and to maintain adequate dietary potassium and calcium preferably from fresh fruits, vegetables, and low-fat dairy products.    stressed the importance of regular exercise  Injury  prevention: Discussed safety belts, safety helmets, smoke detector, smoking near bedding or upholstery.   Dental health: Discussed importance of regular tooth brushing, flossing, and dental visits.   Follow up plan: NEXT PREVENTATIVE PHYSICAL DUE IN 1 YEAR. No follow-ups on file.

## 2018-06-24 DIAGNOSIS — L578 Other skin changes due to chronic exposure to nonionizing radiation: Secondary | ICD-10-CM | POA: Diagnosis not present

## 2018-06-24 DIAGNOSIS — D485 Neoplasm of uncertain behavior of skin: Secondary | ICD-10-CM | POA: Diagnosis not present

## 2018-06-24 DIAGNOSIS — D225 Melanocytic nevi of trunk: Secondary | ICD-10-CM | POA: Diagnosis not present

## 2018-06-24 DIAGNOSIS — Z1283 Encounter for screening for malignant neoplasm of skin: Secondary | ICD-10-CM | POA: Diagnosis not present

## 2018-06-24 DIAGNOSIS — L812 Freckles: Secondary | ICD-10-CM | POA: Diagnosis not present

## 2018-06-24 DIAGNOSIS — L821 Other seborrheic keratosis: Secondary | ICD-10-CM | POA: Diagnosis not present

## 2018-06-24 DIAGNOSIS — L918 Other hypertrophic disorders of the skin: Secondary | ICD-10-CM | POA: Diagnosis not present

## 2018-06-24 DIAGNOSIS — D2230 Melanocytic nevi of unspecified part of face: Secondary | ICD-10-CM | POA: Diagnosis not present

## 2018-06-24 DIAGNOSIS — D229 Melanocytic nevi, unspecified: Secondary | ICD-10-CM | POA: Diagnosis not present

## 2018-06-24 DIAGNOSIS — D1800 Hemangioma unspecified site: Secondary | ICD-10-CM | POA: Diagnosis not present

## 2018-06-25 ENCOUNTER — Telehealth: Payer: Self-pay | Admitting: Family Medicine

## 2018-06-25 NOTE — Telephone Encounter (Signed)
Spoke with patient 06/30/2018 and scheduled his AWV-s for Monday 07/12/2018 at 9:30 AM.  Janace Hoard, Care Guide.  Copied from [place] [phone]. Topic: Medicare AWV >> Jun 25, 2018  2:54 PM Sherren Kerns wrote: Called to schedule Medicare Annual Wellness Visit with the Nurse Health Advisor. No answer.  Left message on voicemail.  If patient returns call, please note: their last AWV was on 06/19/2017,please schedule AWV -s with [facility] any date AFTER 06/19/2018.  Thank you! For any questions please contact: Janace Hoard at [phone] or Skype [email or web address]

## 2018-07-12 ENCOUNTER — Ambulatory Visit (INDEPENDENT_AMBULATORY_CARE_PROVIDER_SITE_OTHER): Payer: Medicare Other

## 2018-07-12 VITALS — BP 130/85 | HR 56 | Temp 97.8°F | Resp 14 | Ht 75.0 in | Wt 270.1 lb

## 2018-07-12 DIAGNOSIS — Z0000 Encounter for general adult medical examination without abnormal findings: Secondary | ICD-10-CM

## 2018-07-12 DIAGNOSIS — R3911 Hesitancy of micturition: Secondary | ICD-10-CM

## 2018-07-12 DIAGNOSIS — I10 Essential (primary) hypertension: Secondary | ICD-10-CM

## 2018-07-12 DIAGNOSIS — Z13220 Encounter for screening for lipoid disorders: Secondary | ICD-10-CM | POA: Diagnosis not present

## 2018-07-12 DIAGNOSIS — Z125 Encounter for screening for malignant neoplasm of prostate: Secondary | ICD-10-CM

## 2018-07-12 DIAGNOSIS — Z1211 Encounter for screening for malignant neoplasm of colon: Secondary | ICD-10-CM

## 2018-07-12 DIAGNOSIS — N200 Calculus of kidney: Secondary | ICD-10-CM

## 2018-07-12 LAB — UA/M W/RFLX CULTURE, ROUTINE
Bilirubin, UA: NEGATIVE
Glucose, UA: NEGATIVE
Ketones, UA: NEGATIVE
Leukocytes, UA: NEGATIVE
NITRITE UA: NEGATIVE
PH UA: 6 (ref 5.0–7.5)
Protein, UA: NEGATIVE
RBC, UA: NEGATIVE
Specific Gravity, UA: 1.02 (ref 1.005–1.030)
UUROB: 1 mg/dL (ref 0.2–1.0)

## 2018-07-12 LAB — MICROALBUMIN, URINE WAIVED
CREATININE, URINE WAIVED: 200 mg/dL (ref 10–300)
Microalb, Ur Waived: 30 mg/L — ABNORMAL HIGH (ref 0–19)

## 2018-07-12 NOTE — Patient Instructions (Signed)
[name] , Thank you for taking time to come for your Medicare Wellness Visit. I appreciate your ongoing commitment to your health goals. Please review the following plan we discussed and let me know if I can assist you in the future.   Screening recommendations/referrals: Colonoscopy: colonoscopy completed 07/28/2013, due soon, someone will call to schedule this appt Recommended yearly ophthalmology/optometry visit for glaucoma screening and checkup Recommended yearly dental visit for hygiene and checkup  Vaccinations: Influenza vaccine: up to date  Pneumococcal vaccine: up to date  Tdap vaccine: up to date Shingles vaccine: shingrix eligible, check with your insurance company for coverage     Advanced directives: Please bring a copy of your health care power of attorney and living will to the office at your convenience.  Conditions/risks identified: none  Next appointment: Follow up on 07/13/2018 at 10:00am with Dr.Johnson. Follow up in one year for your annual wellness exam.   Preventive Care 65 Years and Older, Male Preventive care refers to lifestyle choices and visits with your health care provider that can promote health and wellness. What does preventive care include?  A yearly physical exam. This is also called an annual well check.  Dental exams once or twice a year.  Routine eye exams. Ask your health care provider how often you should have your eyes checked.  Personal lifestyle choices, including:  Daily care of your teeth and gums.  Regular physical activity.  Eating a healthy diet.  Avoiding tobacco and drug use.  Limiting alcohol use.  Practicing safe sex.  Taking low doses of aspirin every day.  Taking vitamin and mineral supplements as recommended by your health care provider. What happens during an annual well check? The services and screenings done by your health care provider during your annual well check will depend on your age, overall health,  lifestyle risk factors, and family history of disease. Counseling  Your health care provider may ask you questions about your:  Alcohol use.  Tobacco use.  Drug use.  Emotional well-being.  Home and relationship well-being.  Sexual activity.  Eating habits.  History of falls.  Memory and ability to understand (cognition).  Work and work Statistician. Screening  You may have the following tests or measurements:  Height, weight, and BMI.  Blood pressure.  Lipid and cholesterol levels. These may be checked every 5 years, or more frequently if you are over 64 years old.  Skin check.  Lung cancer screening. You may have this screening every year starting at age 67 if you have a 30-pack-year history of smoking and currently smoke or have quit within the past 15 years.  Fecal occult blood test (FOBT) of the stool. You may have this test every year starting at age 66.  Flexible sigmoidoscopy or colonoscopy. You may have a sigmoidoscopy every 5 years or a colonoscopy every 10 years starting at age 65.  Prostate cancer screening. Recommendations will vary depending on your family history and other risks.  Hepatitis C blood test.  Hepatitis B blood test.  Sexually transmitted disease (STD) testing.  Diabetes screening. This is done by checking your blood sugar (glucose) after you have not eaten for a while (fasting). You may have this done every 1-3 years.  Abdominal aortic aneurysm (AAA) screening. You may need this if you are a current or former smoker.  Osteoporosis. You may be screened starting at age 8 if you are at high risk. Talk with your health care provider about your test results, treatment  options, and if necessary, the need for more tests. Vaccines  Your health care provider may recommend certain vaccines, such as:  Influenza vaccine. This is recommended every year.  Tetanus, diphtheria, and acellular pertussis (Tdap, Td) vaccine. You may need a Td booster  every 10 years.  Zoster vaccine. You may need this after age 90.  Pneumococcal 13-valent conjugate (PCV13) vaccine. One dose is recommended after age 12.  Pneumococcal polysaccharide (PPSV23) vaccine. One dose is recommended after age 50. Talk to your health care provider about which screenings and vaccines you need and how often you need them. This information is not intended to replace advice given to you by your health care provider. Make sure you discuss any questions you have with your health care provider. Document Released: 06/01/2015 Document Revised: 01/23/2016 Document Reviewed: 03/06/2015 Elsevier Interactive Patient Education  2017 [place] Prevention in the Home Falls can cause injuries. They can happen to people of all ages. There are many things you can do to make your home safe and to help prevent falls. What can I do on the outside of my home?  Regularly fix the edges of walkways and driveways and fix any cracks.  Remove anything that might make you trip as you walk through a door, such as a raised step or threshold.  Trim any bushes or trees on the path to your home.  Use bright outdoor lighting.  Clear any walking paths of anything that might make someone trip, such as rocks or tools.  Regularly check to see if handrails are loose or broken. Make sure that both sides of any steps have handrails.  Any raised decks and porches should have guardrails on the edges.  Have any leaves, snow, or ice cleared regularly.  Use sand or salt on walking paths during winter.  Clean up any spills in your garage right away. This includes oil or grease spills. What can I do in the bathroom?  Use night lights.  Install grab bars by the toilet and in the tub and shower. Do not use towel bars as grab bars.  Use non-skid mats or decals in the tub or shower.  If you need to sit down in the shower, use a plastic, non-slip stool.  Keep the floor dry. Clean up any  water that spills on the floor as soon as it happens.  Remove soap buildup in the tub or shower regularly.  Attach bath mats securely with double-sided non-slip rug tape.  Do not have throw rugs and other things on the floor that can make you trip. What can I do in the bedroom?  Use night lights.  Make sure that you have a light by your bed that is easy to reach.  Do not use any sheets or blankets that are too big for your bed. They should not hang down onto the floor.  Have a firm chair that has side arms. You can use this for support while you get dressed.  Do not have throw rugs and other things on the floor that can make you trip. What can I do in the kitchen?  Clean up any spills right away.  Avoid walking on wet floors.  Keep items that you use a lot in easy-to-reach places.  If you need to reach something above you, use a strong step stool that has a grab bar.  Keep electrical cords out of the way.  Do not use floor polish or wax that makes floors slippery.  If you must use wax, use non-skid floor wax.  Do not have throw rugs and other things on the floor that can make you trip. What can I do with my stairs?  Do not leave any items on the stairs.  Make sure that there are handrails on both sides of the stairs and use them. Fix handrails that are broken or loose. Make sure that handrails are as long as the stairways.  Check any carpeting to make sure that it is firmly attached to the stairs. Fix any carpet that is loose or worn.  Avoid having throw rugs at the top or bottom of the stairs. If you do have throw rugs, attach them to the floor with carpet tape.  Make sure that you have a light switch at the top of the stairs and the bottom of the stairs. If you do not have them, ask someone to add them for you. What else can I do to help prevent falls?  Wear shoes that:  Do not have high heels.  Have rubber bottoms.  Are comfortable and fit you well.  Are closed  at the toe. Do not wear sandals.  If you use a stepladder:  Make sure that it is fully opened. Do not climb a closed stepladder.  Make sure that both sides of the stepladder are locked into place.  Ask someone to hold it for you, if possible.  Clearly mark and make sure that you can see:  Any grab bars or handrails.  First and last steps.  Where the edge of each step is.  Use tools that help you move around (mobility aids) if they are needed. These include:  Canes.  Walkers.  Scooters.  Crutches.  Turn on the lights when you go into a dark area. Replace any light bulbs as soon as they burn out.  Set up your furniture so you have a clear path. Avoid moving your furniture around.  If any of your floors are uneven, fix them.  If there are any pets around you, be aware of where they are.  Review your medicines with your doctor. Some medicines can make you feel dizzy. This can increase your chance of falling. Ask your doctor what other things that you can do to help prevent falls. This information is not intended to replace advice given to you by your health care provider. Make sure you discuss any questions you have with your health care provider. Document Released: 03/01/2009 Document Revised: 10/11/2015 Document Reviewed: 06/09/2014 Elsevier Interactive Patient Education  2017 [employer].

## 2018-07-12 NOTE — Progress Notes (Signed)
Subjective:   Zachary Rogers. is a 68 y.o. male who presents for Medicare Annual/Subsequent preventive examination.  Review of Systems:   Cardiac Risk Factors include: advanced age (>46men, >22 women);hypertension;male gender;obesity (BMI >30kg/m2)     Objective:    Vitals: BP 130/85 (BP Location: Left Arm, Patient Position: Sitting, Cuff Size: Normal)   Pulse (!) 56   Temp 97.8 F (36.6 C) (Temporal)   Resp 14   Ht 6\' 3"  (1.905 m)   Wt 270 lb 1.6 oz (122.5 kg)   BMI 33.76 kg/m   Body mass index is 33.76 kg/m.  Advanced Directives 07/12/2018  Does Patient Have a Medical Advance Directive? Yes  Type of Advance Directive Living will;Healthcare Power of [place] in Chart? No - copy requested    Tobacco Social History   Tobacco Use  Smoking Status Former Smoker  Smokeless Tobacco Never Used  Tobacco Comment   social smoker when younger      Counseling given: Not Answered Comment: social smoker when younger    Clinical Intake:  Pre-visit preparation completed: Yes  Pain : No/denies pain     Nutritional Status: BMI > 30  Obese Nutritional Risks: None Diabetes: No  How often do you need to have someone help you when you read instructions, pamphlets, or other written materials from your doctor or pharmacy?: 1 - Never What is the last grade level you completed in school?: masters   Interpreter Needed?: No  Information entered by :: Bernice Mcauliffe,LPN   Past Medical History:  Diagnosis Date  . Hypertension    Past Surgical History:  Procedure Laterality Date  . CHOLECYSTECTOMY  06/07/1998  . SKIN LESION EXCISION     dermatlogy - precancerous lesion 1 year ago on back    Family History  Problem Relation Age of Onset  . Lung cancer Mother   . Hypertension Father   . Diabetes Father   . Prostate cancer Neg Hx   . Bladder Cancer Neg Hx   . Kidney cancer Neg Hx    Social History   Socioeconomic History  .  Marital status: Married    Spouse name: Not on file  . Number of children: Not on file  . Years of education: Not on file  . Highest education level: Master's degree (e.g., MA, MS, MEng, MEd, MSW, MBA)  Occupational History  . Occupation: working Lexicographer  . Financial resource strain: Not hard at all  . Food insecurity:    Worry: Never true    Inability: Never true  . Transportation needs:    Medical: No    Non-medical: No  Tobacco Use  . Smoking status: Former Research scientist (life sciences)  . Smokeless tobacco: Never Used  . Tobacco comment: social smoker when younger   Substance and Sexual Activity  . Alcohol use: Yes    Alcohol/week: 2.0 - 3.0 standard drinks    Types: 2 - 3 Shots of liquor per week  . Drug use: No  . Sexual activity: Yes  Lifestyle  . Physical activity:    Days per week: 0 days    Minutes per session: 0 min  . Stress: Not at all  Relationships  . Social connections:    Talks on phone: More than three times a week    Gets together: More than three times a week    Attends religious service: Never    Active member of club or organization: Yes  Attends meetings of clubs or organizations: More than 4 times per year    Relationship status: Married  Other Topics Concern  . Not on file  Social History Theatre stage manager, local friends.     Outpatient Encounter Medications as of 07/12/2018  Medication Sig  . amLODipine (NORVASC) 5 MG tablet TAKE 1 TABLET DAILY  . Ascorbic Acid (VITAMIN C) 1000 MG tablet Take 1,000 mg by mouth daily.  . Cholecalciferol (VITAMIN D3) 3000 units TABS Take 1,000 Units by mouth.  . Cyanocobalamin (VITAMIN B 12 PO) Take 3,000 mcg by mouth daily.  . Glucosamine-Chondroit-Vit C-Mn (GLUCOSAMINE 1500 COMPLEX PO) Take 1,500 mg by mouth 2 (two) times daily.  . Methylsulfonylmethane (MSM) 1500 MG TABS Take 1,500 mg by mouth 2 (two) times daily.  . Multiple Vitamin (MULTIVITAMIN) tablet Take 1 tablet by mouth daily.  . Omega-3  Fatty Acids (FISH OIL) 1200 MG CAPS Take 1,200 mg by mouth 2 (two) times daily.  . [DISCONTINUED] aspirin 325 MG tablet Take 325 mg by mouth daily.  . [DISCONTINUED] oxyCODONE-acetaminophen (PERCOCET) 5-325 MG tablet Take 1 tablet by mouth every 4 (four) hours as needed for severe pain. (Patient not taking: Reported on 08/27/2017)   No facility-administered encounter medications on file as of 07/12/2018.     Activities of Daily Living In your present state of health, do you have any difficulty performing the following activities: 07/12/2018  Hearing? N  Vision? N  Difficulty concentrating or making decisions? N  Walking or climbing stairs? N  Dressing or bathing? N  Doing errands, shopping? N  Preparing Food and eating ? N  Using the Toilet? N  In the past six months, have you accidently leaked urine? N  Do you have problems with loss of bowel control? N  Managing your Medications? N  Managing your Finances? N  Housekeeping or managing your Housekeeping? N  Some recent data might be hidden    Patient Care Team: Valerie Roys, DO as PCP - General (Family Medicine)   Assessment:   This is a routine wellness examination for [place].  Exercise Activities and Dietary recommendations Current Exercise Habits: Home exercise routine, Type of exercise: walking, Time (Minutes): 60, Frequency (Times/Week): 7, Weekly Exercise (Minutes/Week): 420, Intensity: Mild, Exercise limited by: None identified  Goals    . Exercise 3x per week (30 min per time) (pt-stated)     Walk every day for approx 60min.        Fall Risk Fall Risk  07/12/2018 06/19/2017 06/02/2017 06/09/2016  Falls in the past year? 1 No No No  Number falls in past yr: 0 - - -  Comment November 2019, leg weakened going up the stairs. - - -  Injury with Fall? 0 - - -   FALL RISK PREVENTION PERTAINING TO THE HOME:  Any stairs in or around the home? Yes  If so, are there any without handrails? No   Home free of loose throw  rugs in walkways, pet beds, electrical cords, etc? Yes  Adequate lighting in your home to reduce risk of falls? Yes   ASSISTIVE DEVICES UTILIZED TO PREVENT FALLS:  Life alert? No  Use of a cane, walker or w/c? No  Grab bars in the bathroom? No  Shower chair or bench in shower? No  Elevated toilet seat or a handicapped toilet? No   DME ORDERS:  DME order needed?  No   TIMED UP AND GO:  Was the test performed? Yes .  Length of time to ambulate 10 feet: 11 sec.   GAIT:  Appearance of gait: Gait stead-fast without the use of an assistive device.  Education: Fall risk prevention has been discussed.  Intervention(s) required? No    Depression Screen PHQ 2/9 Scores 07/12/2018 06/19/2017 06/02/2017 06/09/2016  PHQ - 2 Score 0 0 0 0    Cognitive Function     6CIT Screen 07/12/2018 06/19/2017  What Year? 0 points 0 points  What month? 0 points 0 points  What time? 0 points 0 points  Count back from 20 0 points 0 points  Months in reverse 0 points 0 points  Repeat phrase 0 points 4 points  Total Score 0 4    Immunization History  Administered Date(s) Administered  . Influenza, High Dose Seasonal PF 05/06/2017, 04/07/2018  . Influenza-Unspecified 05/06/2017  . Pneumococcal Conjugate-13 06/19/2014  . Pneumococcal Polysaccharide-23 06/20/2015  . Tdap 06/19/2014  . Zoster 06/19/2014    Qualifies for Shingles Vaccine? Yes  Zostavax completed 06/19/2014. Due for Shingrix. Education has been provided regarding the importance of this vaccine. Pt has been advised to call insurance company to determine out of pocket expense. Advised may also receive vaccine at local pharmacy or Health Dept. Verbalized acceptance and understanding.  Tdap: up to date  Flu Vaccine: up to date   Pneumococcal Vaccine: up to date   Screening Tests Health Maintenance  Topic Date Due  . COLONOSCOPY  07/29/2018  . TETANUS/TDAP  06/19/2024  . INFLUENZA VACCINE  Completed  . Hepatitis C Screening  Completed   . PNA vac Low Risk Adult  Completed   Cancer Screenings:  Colorectal Screening: Completed 07/28/2013. Repeat every 5 years;referral placed today    Lung Cancer Screening: (Low Dose CT Chest recommended if Age 67-80 years, 30 pack-year currently smoking OR have quit w/in 15years.) does not qualify.     Additional Screening:  Hepatitis C Screening: does qualify; Completed 04/02/2018  Vision Screening: Recommended annual ophthalmology exams for early detection of glaucoma and other disorders of the eye. Is the patient up to date with their annual eye exam?  Yes  Who is the provider or what is the name of the office in which the pt attends annual eye exams? [facility]    Dental Screening: Recommended annual dental exams for proper oral hygiene  Community Resource Referral:  CRR required this visit?  No       Plan:    I have personally reviewed and addressed the Medicare Annual Wellness questionnaire and have noted the following in the patient's chart:  A. Medical and social history B. Use of alcohol, tobacco or illicit drugs  C. Current medications and supplements D. Functional ability and status E.  Nutritional status F.  Physical activity G. Advance directives H. List of other physicians I.  Hospitalizations, surgeries, and ER visits in previous 12 months J.  [place] such as hearing and vision if needed, cognitive and depression L. Referrals and appointments   In addition, I have reviewed and discussed with patient certain preventive protocols, quality metrics, and best practice recommendations. A written personalized care plan for preventive services as well as general preventive health recommendations were provided to patient.   Signed,  Tyler Aas, LPN Nurse Health Advisor   Nurse Notes numbness in left leg from fall in November. Patient states he had some back pain in November that radiated down into left leg causing numbness and tingling and knee  swelling which resulted tripping going up the  stairs. No further injury from the fall but states he still has some numbness in left leg and still some swelling in knee as well. Numbness begins at knee and radiates down, no numbness or tingling in foot.  Also has some numbness in left arm occasionally. He has had an injury to his rotator cuff in the past and received injections previously. appt tomorrow 07/12/2018 for cpe with Dr.Johnson.

## 2018-07-12 NOTE — Addendum Note (Signed)
Addended by: Tyler Aas A on: 07/12/2018 11:53 AM   Modules accepted: Orders

## 2018-07-13 ENCOUNTER — Other Ambulatory Visit: Payer: Self-pay

## 2018-07-13 ENCOUNTER — Encounter: Payer: Self-pay | Admitting: Family Medicine

## 2018-07-13 ENCOUNTER — Ambulatory Visit (INDEPENDENT_AMBULATORY_CARE_PROVIDER_SITE_OTHER): Payer: Medicare Other | Admitting: Family Medicine

## 2018-07-13 VITALS — BP 136/76 | HR 80 | Temp 97.7°F | Ht 75.0 in | Wt 270.0 lb

## 2018-07-13 DIAGNOSIS — Z0000 Encounter for general adult medical examination without abnormal findings: Secondary | ICD-10-CM

## 2018-07-13 DIAGNOSIS — Z86010 Personal history of colonic polyps: Secondary | ICD-10-CM

## 2018-07-13 DIAGNOSIS — R918 Other nonspecific abnormal finding of lung field: Secondary | ICD-10-CM | POA: Diagnosis not present

## 2018-07-13 DIAGNOSIS — I10 Essential (primary) hypertension: Secondary | ICD-10-CM

## 2018-07-13 DIAGNOSIS — Z1211 Encounter for screening for malignant neoplasm of colon: Secondary | ICD-10-CM | POA: Diagnosis not present

## 2018-07-13 LAB — COMPREHENSIVE METABOLIC PANEL
A/G RATIO: 1.9 (ref 1.2–2.2)
ALK PHOS: 88 IU/L (ref 39–117)
ALT: 43 IU/L (ref 0–44)
AST: 27 IU/L (ref 0–40)
Albumin: 4.6 g/dL (ref 3.8–4.8)
BILIRUBIN TOTAL: 1.5 mg/dL — AB (ref 0.0–1.2)
BUN/Creatinine Ratio: 13 (ref 10–24)
BUN: 14 mg/dL (ref 8–27)
CHLORIDE: 103 mmol/L (ref 96–106)
CO2: 22 mmol/L (ref 20–29)
Calcium: 9.5 mg/dL (ref 8.6–10.2)
Creatinine, Ser: 1.12 mg/dL (ref 0.76–1.27)
GFR calc Af Amer: 77 mL/min/{1.73_m2} (ref >59)
GFR calc non Af Amer: 67 mL/min/{1.73_m2} (ref >59)
Globulin, Total: 2.4 g/dL (ref 1.5–4.5)
Glucose: 104 mg/dL — ABNORMAL HIGH (ref 65–99)
POTASSIUM: 4.1 mmol/L (ref 3.5–5.2)
Sodium: 139 mmol/L (ref 134–144)
Total Protein: 7 g/dL (ref 6.0–8.5)

## 2018-07-13 LAB — LIPID PANEL W/O CHOL/HDL RATIO
CHOLESTEROL TOTAL: 228 mg/dL — AB (ref 100–199)
HDL: 74 mg/dL (ref >39)
LDL Calculated: 126 mg/dL — ABNORMAL HIGH (ref 0–99)
TRIGLYCERIDES: 140 mg/dL (ref 0–149)
VLDL Cholesterol Cal: 28 mg/dL (ref 5–40)

## 2018-07-13 LAB — CBC WITH DIFFERENTIAL/PLATELET
BASOS ABS: 0.1 10*3/uL (ref 0.0–0.2)
BASOS: 1 %
EOS (ABSOLUTE): 0.2 10*3/uL (ref 0.0–0.4)
Eos: 5 %
Hematocrit: 47.2 % (ref 37.5–51.0)
Hemoglobin: 16.7 g/dL (ref 13.0–17.7)
Immature Grans (Abs): 0 10*3/uL (ref 0.0–0.1)
Immature Granulocytes: 0 %
Lymphocytes Absolute: 1.2 10*3/uL (ref 0.7–3.1)
Lymphs: 24 %
MCH: 31 pg (ref 26.6–33.0)
MCHC: 35.4 g/dL (ref 31.5–35.7)
MCV: 88 fL (ref 79–97)
MONOS ABS: 0.4 10*3/uL (ref 0.1–0.9)
Monocytes: 8 %
NEUTROS ABS: 3.2 10*3/uL (ref 1.4–7.0)
Neutrophils: 62 %
Platelets: 189 10*3/uL (ref 150–450)
RBC: 5.39 x10E6/uL (ref 4.14–5.80)
RDW: 14 % (ref 11.6–15.4)
WBC: 5.1 10*3/uL (ref 3.4–10.8)

## 2018-07-13 LAB — TSH: TSH: 1.8 u[IU]/mL (ref 0.450–4.500)

## 2018-07-13 LAB — PSA: Prostate Specific Ag, Serum: 1.8 ng/mL (ref 0.0–4.0)

## 2018-07-13 MED ORDER — AMLODIPINE BESYLATE 5 MG PO TABS: 5.0000 mg | ORAL_TABLET | Freq: Every day | ORAL | 3 refills | Status: DC

## 2018-07-13 MED ORDER — NA SULFATE-K SULFATE-MG SULF 17.5-3.13-1.6 GM/177ML PO SOLN: 1.0000 | Freq: Once | ORAL | 0 refills | Status: AC

## 2018-07-13 NOTE — Assessment & Plan Note (Signed)
Due for follow up CT chest in April. We will arrange.

## 2018-07-13 NOTE — Assessment & Plan Note (Signed)
Under good control. Continue current regimen. Continue to monitor. Call with any concerns. Refills given.  

## 2018-07-13 NOTE — Patient Instructions (Signed)
Sciatica Rehab Ask your health care provider which exercises are safe for you. Do exercises exactly as told by your health care provider and adjust them as directed. It is normal to feel mild stretching, pulling, tightness, or discomfort as you do these exercises, but you should stop right away if you feel sudden pain or your pain gets worse.Do not begin these exercises until told by your health care provider. Stretching and range of motion exercises These exercises warm up your muscles and joints and improve the movement and flexibility of your hips and your back. These exercises also help to relieve pain, numbness, and tingling. Exercise A: Sciatic nerve glide 1. Sit in a chair with your head facing down toward your chest. Place your hands behind your back. Let your shoulders slump forward. 2. Slowly straighten one of your knees while you tilt your head back as if you are looking toward the ceiling. Only straighten your leg as far as you can without making your symptoms worse. 3. Hold for __________ seconds. 4. Slowly return to the starting position. 5. Repeat with your other leg. Repeat __________ times. Complete this exercise __________ times a day. Exercise B: Knee to chest with hip adduction and internal rotation  1. Lie on your back on a firm surface with both legs straight. 2. Bend one of your knees and move it up toward your chest until you feel a gentle stretch in your lower back and buttock. Then, move your knee toward the shoulder that is on the opposite side from your leg. ? Hold your leg in this position by holding onto the front of your knee. 3. Hold for __________ seconds. 4. Slowly return to the starting position. 5. Repeat with your other leg. Repeat __________ times. Complete this exercise __________ times a day. Exercise C: Prone extension on elbows  1. Lie on your abdomen on a firm surface. A bed may be too soft for this exercise. 2. Prop yourself up on your  elbows. 3. Use your arms to help lift your chest up until you feel a gentle stretch in your abdomen and your lower back. ? This will place some of your body weight on your elbows. If this is uncomfortable, try stacking pillows under your chest. ? Your hips should stay down, against the surface that you are lying on. Keep your hip and back muscles relaxed. 4. Hold for __________ seconds. 5. Slowly relax your upper body and return to the starting position. Repeat __________ times. Complete this exercise __________ times a day. Strengthening exercises These exercises build strength and endurance in your back. Endurance is the ability to use your muscles for a long time, even after they get tired. Exercise D: Pelvic tilt 1. Lie on your back on a firm surface. Bend your knees and keep your feet flat. 2. Tense your abdominal muscles. Tip your pelvis up toward the ceiling and flatten your lower back into the floor. ? To help with this exercise, you may place a small towel under your lower back and try to push your back into the towel. 3. Hold for __________ seconds. 4. Let your muscles relax completely before you repeat this exercise. Repeat __________ times. Complete this exercise __________ times a day. Exercise E: Alternating arm and leg raises  1. Get on your hands and knees on a firm surface. If you are on a hard floor, you may want to use padding to cushion your knees, such as an exercise mat. 2. Line up your arms  and legs. Your hands should be below your shoulders, and your knees should be below your hips. 3. Lift your left leg behind you. At the same time, raise your right arm and straighten it in front of you. ? Do not lift your leg higher than your hip. ? Do not lift your arm higher than your shoulder. ? Keep your abdominal and back muscles tight. ? Keep your hips facing the ground. ? Do not arch your back. ? Keep your balance carefully, and do not hold your breath. 4. Hold for  __________ seconds. 5. Slowly return to the starting position and repeat with your right leg and your left arm. Repeat __________ times. Complete this exercise __________ times a day. Posture and body mechanics  Body mechanics refers to the movements and positions of your body while you do your daily activities. Posture is part of body mechanics. Good posture and healthy body mechanics can help to relieve stress in your body's tissues and joints. Good posture means that your spine is in its natural S-curve position (your spine is neutral), your shoulders are pulled back slightly, and your head is not tipped forward. The following are general guidelines for applying improved posture and body mechanics to your everyday activities. Standing   When standing, keep your spine neutral and your feet about hip-width apart. Keep a slight bend in your knees. Your ears, shoulders, and hips should line up.  When you do a task in which you stand in one place for a long time, place one foot up on a stable object that is 2-4 inches (5-10 cm) high, such as a footstool. This helps keep your spine neutral. Sitting   When sitting, keep your spine neutral and keep your feet flat on the floor. Use a footrest, if necessary, and keep your thighs parallel to the floor. Avoid rounding your shoulders, and avoid tilting your head forward.  When working at a desk or a computer, keep your desk at a height where your hands are slightly lower than your elbows. Slide your chair under your desk so you are close enough to maintain good posture.  When working at a computer, place your monitor at a height where you are looking straight ahead and you do not have to tilt your head forward or downward to look at the screen. Resting   When lying down and resting, avoid positions that are most painful for you.  If you have pain with activities such as sitting, bending, stooping, or squatting (flexion-based activities), lie in a  position in which your body does not bend very much. For example, avoid curling up on your side with your arms and knees near your chest (fetal position).  If you have pain with activities such as standing for a long time or reaching with your arms (extension-based activities), lie with your spine in a neutral position and bend your knees slightly. Try the following positions: ? Lying on your side with a pillow between your knees. ? Lying on your back with a pillow under your knees. Lifting   When lifting objects, keep your feet at least shoulder-width apart and tighten your abdominal muscles.  Bend your knees and hips and keep your spine neutral. It is important to lift using the strength of your legs, not your back. Do not lock your knees straight out.  Always ask for help to lift heavy or awkward objects. This information is not intended to replace advice given to you by your health care  provider. Make sure you discuss any questions you have with your health care provider. Document Released: 05/05/2005 Document Revised: 01/10/2016 Document Reviewed: 01/19/2015 Elsevier Interactive Patient Education  2019 [place] Maintenance After Age 20 After age 77, you are at a higher risk for certain long-term diseases and infections as well as injuries from falls. Falls are a major cause of broken bones and head injuries in people who are older than age 55. Getting regular preventive care can help to keep you healthy and well. Preventive care includes getting regular testing and making lifestyle changes as recommended by your health care provider. Talk with your health care provider about:  Which screenings and tests you should have. A screening is a test that checks for a disease when you have no symptoms.  A diet and exercise plan that is right for you. What should I know about screenings and tests to prevent falls? Screening and testing are the best ways to find a health problem  early. Early diagnosis and treatment give you the best chance of managing medical conditions that are common after age 85. Certain conditions and lifestyle choices may make you more likely to have a fall. Your health care provider may recommend:  Regular vision checks. Poor vision and conditions such as cataracts can make you more likely to have a fall. If you wear glasses, make sure to get your prescription updated if your vision changes.  Medicine review. Work with your health care provider to regularly review all of the medicines you are taking, including over-the-counter medicines. Ask your health care provider about any side effects that may make you more likely to have a fall. Tell your health care provider if any medicines that you take make you feel dizzy or sleepy.  Osteoporosis screening. Osteoporosis is a condition that causes the bones to get weaker. This can make the bones weak and cause them to break more easily.  Blood pressure screening. Blood pressure changes and medicines to control blood pressure can make you feel dizzy.  Strength and balance checks. Your health care provider may recommend certain tests to check your strength and balance while standing, walking, or changing positions.  Foot health exam. Foot pain and numbness, as well as not wearing proper footwear, can make you more likely to have a fall.  Depression screening. You may be more likely to have a fall if you have a fear of falling, feel emotionally low, or feel unable to do activities that you used to do.  Alcohol use screening. Using too much alcohol can affect your balance and may make you more likely to have a fall. What actions can I take to lower my risk of falls? General instructions  Talk with your health care provider about your risks for falling. Tell your health care provider if: ? You fall. Be sure to tell your health care provider about all falls, even ones that seem minor. ? You feel dizzy, sleepy,  or off-balance.  Take over-the-counter and prescription medicines only as told by your health care provider. These include any supplements.  Eat a healthy diet and maintain a healthy weight. A healthy diet includes low-fat dairy products, low-fat (lean) meats, and fiber from whole grains, beans, and lots of fruits and vegetables. Home safety  Remove any tripping hazards, such as rugs, cords, and clutter.  Install safety equipment such as grab bars in bathrooms and safety rails on stairs.  Keep rooms and walkways well-lit. Activity   Follow a  regular exercise program to stay fit. This will help you maintain your balance. Ask your health care provider what types of exercise are appropriate for you.  If you need a cane or walker, use it as recommended by your health care provider.  Wear supportive shoes that have nonskid soles. Lifestyle  Do not drink alcohol if your health care provider tells you not to drink.  If you drink alcohol, limit how much you have: ? 0-1 drink a day for women. ? 0-2 drinks a day for men.  Be aware of how much alcohol is in your drink. In the [place] one drink equals one typical bottle of beer (12 oz), one-half glass of wine (5 oz), or one shot of hard liquor (1 oz).  Do not use any products that contain nicotine or tobacco, such as cigarettes and e-cigarettes. If you need help quitting, ask your health care provider. Summary  Having a healthy lifestyle and getting preventive care can help to protect your health and wellness after age 33.  Screening and testing are the best way to find a health problem early and help you avoid having a fall. Early diagnosis and treatment give you the best chance for managing medical conditions that are more common for people who are older than age 58.  Falls are a major cause of broken bones and head injuries in people who are older than age 47. Take precautions to prevent a fall at home.  Work with your health care  provider to learn what changes you can make to improve your health and wellness and to prevent falls. This information is not intended to replace advice given to you by your health care provider. Make sure you discuss any questions you have with your health care provider. Document Released: 03/18/2017 Document Revised: 03/18/2017 Document Reviewed: 03/18/2017 Elsevier Interactive Patient Education  2019 [employer].

## 2018-07-13 NOTE — Progress Notes (Signed)
BP 136/76   Pulse 80   Temp 97.7 F (36.5 C) (Oral)   Ht '6\' 3"'$  (1.905 m)   Wt 270 lb (122.5 kg)   SpO2 98%   BMI 33.75 kg/m    Subjective:    Patient ID: Zachary Ley., male    DOB: 03/14/1951, 68 y.o.   MRN: [id number]  HPI: Zachary Manas. is a 68 y.o. male presenting on 07/13/2018 for comprehensive medical examination. Current medical complaints include:  HYPERTENSION Hypertension status: controlled  Satisfied with current treatment? yes Duration of hypertension: chronic BP monitoring frequency:  not checking BP medication side effects:  no Medication compliance: excellent compliance Previous BP meds: amlodipine Aspirin: no Recurrent headaches: no Visual changes: no Palpitations: no Dyspnea: no Chest pain: no Lower extremity edema: no Dizzy/lightheaded: no  Interim Problems from his last visit: no  Depression Screen done today and results listed below:  Depression screen [facility] 2/9 07/12/2018 06/19/2017 06/02/2017 06/09/2016  Decreased Interest 0 0 0 0  Down, Depressed, Hopeless 0 0 0 0  PHQ - 2 Score 0 0 0 0    Past Medical History:  Past Medical History:  Diagnosis Date  . Hypertension     Surgical History:  Past Surgical History:  Procedure Laterality Date  . CHOLECYSTECTOMY  06/07/1998  . SKIN LESION EXCISION     dermatlogy - precancerous lesion 1 year ago on back     Medications:  Current Outpatient Medications on File Prior to Visit  Medication Sig  . Ascorbic Acid (VITAMIN C) 1000 MG tablet Take 1,000 mg by mouth daily.  . Cholecalciferol (VITAMIN D3) 3000 units TABS Take 1,000 Units by mouth.  . Cyanocobalamin (VITAMIN B 12 PO) Take 3,000 mcg by mouth daily.  . Glucosamine-Chondroit-Vit C-Mn (GLUCOSAMINE 1500 COMPLEX PO) Take 1,500 mg by mouth 2 (two) times daily.  . Methylsulfonylmethane (MSM) 1500 MG TABS Take 1,500 mg by mouth 2 (two) times daily.  . Multiple Vitamin (MULTIVITAMIN) tablet Take 1 tablet by mouth daily.  . Omega-3 Fatty  Acids (FISH OIL) 1200 MG CAPS Take 1,200 mg by mouth 2 (two) times daily.   No current facility-administered medications on file prior to visit.     Allergies:  No Known Allergies  Social History:  Social History   Socioeconomic History  . Marital status: Married    Spouse name: Not on file  . Number of children: Not on file  . Years of education: Not on file  . Highest education level: Master's degree (e.g., MA, MS, MEng, MEd, MSW, MBA)  Occupational History  . Occupation: working Lexicographer  . Financial resource strain: Not hard at all  . Food insecurity:    Worry: Never true    Inability: Never true  . Transportation needs:    Medical: No    Non-medical: No  Tobacco Use  . Smoking status: Former Research scientist (life sciences)  . Smokeless tobacco: Never Used  . Tobacco comment: social smoker when younger   Substance and Sexual Activity  . Alcohol use: Yes    Alcohol/week: 2.0 - 3.0 standard drinks    Types: 2 - 3 Shots of liquor per week  . Drug use: No  . Sexual activity: Yes  Lifestyle  . Physical activity:    Days per week: 0 days    Minutes per session: 0 min  . Stress: Not at all  Relationships  . Social connections:    Talks on phone: More than three times  a week    Gets together: More than three times a week    Attends religious service: Never    Active member of club or organization: Yes    Attends meetings of clubs or organizations: More than 4 times per year    Relationship status: Married  . Intimate partner violence:    Fear of current or ex partner: No    Emotionally abused: No    Physically abused: No    Forced sexual activity: No  Other Topics Concern  . Not on file  Social History Theatre stage manager, local friends.    Social History   Tobacco Use  Smoking Status Former Smoker  Smokeless Tobacco Never Used  Tobacco Comment   social smoker when younger    Social History   Substance and Sexual Activity  Alcohol Use Yes  .  Alcohol/week: 2.0 - 3.0 standard drinks  . Types: 2 - 3 Shots of liquor per week    Family History:  Family History  Problem Relation Age of Onset  . Lung cancer Mother   . Hypertension Father   . Diabetes Father   . Prostate cancer Neg Hx   . Bladder Cancer Neg Hx   . Kidney cancer Neg Hx     Past medical history, surgical history, medications, allergies, family history and social history reviewed with patient today and changes made to appropriate areas of the chart.   Review of Systems  Constitutional: Negative.   HENT: Positive for congestion and tinnitus. Negative for ear discharge, ear pain, hearing loss, nosebleeds, sinus pain and sore throat.   Eyes: Negative.   Respiratory: Positive for cough. Negative for hemoptysis, sputum production, shortness of breath, wheezing and stridor.   Cardiovascular: Negative.   Gastrointestinal: Positive for heartburn. Negative for abdominal pain, blood in stool, constipation, diarrhea, melena, nausea and vomiting.  Genitourinary: Negative.   Musculoskeletal: Positive for back pain and joint pain. Negative for falls, myalgias and neck pain.  Skin: Negative.   Neurological: Positive for tingling. Negative for dizziness, tremors, sensory change, speech change, focal weakness, seizures, loss of consciousness, weakness and headaches.  Endo/Heme/Allergies: Negative.   Psychiatric/Behavioral: Negative.     All other ROS negative except what is listed above and in the HPI.      Objective:    BP 136/76   Pulse 80   Temp 97.7 F (36.5 C) (Oral)   Ht '6\' 3"'$  (1.905 m)   Wt 270 lb (122.5 kg)   SpO2 98%   BMI 33.75 kg/m   Wt Readings from Last 3 Encounters:  07/13/18 270 lb (122.5 kg)  07/12/18 270 lb 1.6 oz (122.5 kg)  08/27/17 257 lb 1.6 oz (116.6 kg)    Physical Exam Vitals signs and nursing note reviewed.  Constitutional:      General: He is not in acute distress.    Appearance: Normal appearance. He is obese. He is not  ill-appearing, toxic-appearing or diaphoretic.  HENT:     Head: Normocephalic and atraumatic.     Right Ear: Tympanic membrane, ear canal and external ear normal. There is no impacted cerumen.     Left Ear: Tympanic membrane, ear canal and external ear normal. There is no impacted cerumen.     Nose: Nose normal. No congestion or rhinorrhea.     Mouth/Throat:     Mouth: Mucous membranes are moist.     Pharynx: Oropharynx is clear. No oropharyngeal exudate or posterior oropharyngeal erythema.  Eyes:  General: No scleral icterus.       Right eye: No discharge.        Left eye: No discharge.     Extraocular Movements: Extraocular movements intact.     Conjunctiva/sclera: Conjunctivae normal.     Pupils: Pupils are equal, round, and reactive to light.  Neck:     Musculoskeletal: Normal range of motion and neck supple. No neck rigidity or muscular tenderness.     Vascular: No carotid bruit.  Cardiovascular:     Rate and Rhythm: Normal rate and regular rhythm.     Pulses: Normal pulses.     Heart sounds: No murmur. No friction rub. No gallop.   Pulmonary:     Effort: Pulmonary effort is normal. No respiratory distress.     Breath sounds: Normal breath sounds. No stridor. No wheezing, rhonchi or rales.  Chest:     Chest wall: No tenderness.  Abdominal:     General: Abdomen is flat. Bowel sounds are normal. There is no distension.     Palpations: Abdomen is soft. There is no mass.     Tenderness: There is no abdominal tenderness. There is no right CVA tenderness, left CVA tenderness, guarding or rebound.     Hernia: No hernia is present.  Genitourinary:    Comments: Genital exam deferred with shared decision making Musculoskeletal:        General: No swelling, tenderness, deformity or signs of injury.     Right lower leg: No edema.     Left lower leg: No edema.  Lymphadenopathy:     Cervical: No cervical adenopathy.  Skin:    General: Skin is warm and dry.     Capillary Refill:  Capillary refill takes less than 2 seconds.     Coloration: Skin is not jaundiced or pale.     Findings: No bruising, erythema, lesion or rash.  Neurological:     General: No focal deficit present.     Mental Status: He is alert and oriented to person, place, and time.     Cranial Nerves: No cranial nerve deficit.     Sensory: No sensory deficit.     Motor: No weakness.     Coordination: Coordination normal.     Gait: Gait normal.     Deep Tendon Reflexes: Reflexes normal.  Psychiatric:        Mood and Affect: Mood normal.        Behavior: Behavior normal.        Thought Content: Thought content normal.        Judgment: Judgment normal.     Results for orders placed or performed in visit on 07/12/18  CBC with Differential  Result Value Ref Range   WBC 5.1 3.4 - 10.8 x10E3/uL   RBC 5.39 4.14 - 5.80 x10E6/uL   Hemoglobin 16.7 13.0 - 17.7 g/dL   Hematocrit 47.2 37.5 - 51.0 %   MCV 88 79 - 97 fL   MCH 31.0 26.6 - 33.0 pg   MCHC 35.4 31.5 - 35.7 g/dL   RDW 14.0 11.6 - 15.4 %   Platelets 189 150 - 450 x10E3/uL   Neutrophils 62 Not Estab. %   Lymphs 24 Not Estab. %   Monocytes 8 Not Estab. %   Eos 5 Not Estab. %   Basos 1 Not Estab. %   Neutrophils Absolute 3.2 1.4 - 7.0 x10E3/uL   Lymphocytes Absolute 1.2 0.7 - 3.1 x10E3/uL   Monocytes Absolute 0.4 0.1 - 0.9  x10E3/uL   EOS (ABSOLUTE) 0.2 0.0 - 0.4 x10E3/uL   Basophils Absolute 0.1 0.0 - 0.2 x10E3/uL   Immature Granulocytes 0 Not Estab. %   Immature Grans (Abs) 0.0 0.0 - 0.1 x10E3/uL  Comp Met (CMET)  Result Value Ref Range   Glucose 104 (H) 65 - 99 mg/dL   BUN 14 8 - 27 mg/dL   Creatinine, Ser 1.12 0.76 - 1.27 mg/dL   GFR calc non Af Amer 67 >59 mL/min/1.73   GFR calc Af Amer 77 >59 mL/min/1.73   BUN/Creatinine Ratio 13 10 - 24   Sodium 139 134 - 144 mmol/L   Potassium 4.1 3.5 - 5.2 mmol/L   Chloride 103 96 - 106 mmol/L   CO2 22 20 - 29 mmol/L   Calcium 9.5 8.6 - 10.2 mg/dL   Total Protein 7.0 6.0 - 8.5 g/dL    Albumin 4.6 3.8 - 4.8 g/dL   Globulin, Total 2.4 1.5 - 4.5 g/dL   Albumin/Globulin Ratio 1.9 1.2 - 2.2   Bilirubin Total 1.5 (H) 0.0 - 1.2 mg/dL   Alkaline Phosphatase 88 39 - 117 IU/L   AST 27 0 - 40 IU/L   ALT 43 0 - 44 IU/L  Lipid Panel w/o Chol/HDL Ratio  Result Value Ref Range   Cholesterol, Total 228 (H) 100 - 199 mg/dL   Triglycerides 140 0 - 149 mg/dL   HDL 74 >39 mg/dL   VLDL Cholesterol Cal 28 5 - 40 mg/dL   LDL Calculated 126 (H) 0 - 99 mg/dL  PSA  Result Value Ref Range   Prostate Specific Ag, Serum 1.8 0.0 - 4.0 ng/mL  TSH  Result Value Ref Range   TSH 1.800 0.450 - 4.500 uIU/mL  UA/M w/rflx Culture, Routine (STAT)  Result Value Ref Range   Specific Gravity, UA 1.020 1.005 - 1.030   pH, UA 6.0 5.0 - 7.5   Color, UA Yellow Yellow   Appearance Ur Clear Clear   Leukocytes, UA Negative Negative   Protein, UA Negative Negative/Trace   Glucose, UA Negative Negative   Ketones, UA Negative Negative   RBC, UA Negative Negative   Bilirubin, UA Negative Negative   Urobilinogen, Ur 1.0 0.2 - 1.0 mg/dL   Nitrite, UA Negative Negative  Microalbumin, Urine Waived (STAT)  Result Value Ref Range   Microalb, Ur Waived 30 (H) 0 - 19 mg/L   Creatinine, Urine Waived 200 10 - 300 mg/dL   Microalb/Creat Ratio <30 <30 mg/g      Assessment & Plan:   Problem List Items Addressed This Visit      Cardiovascular and Mediastinum   Hypertension    Under good control. Continue current regimen. Continue to monitor. Call with any concerns. Refills given.       Relevant Medications   amLODipine (NORVASC) 5 MG tablet     Other   Pulmonary nodules/lesions, multiple    Due for follow up CT chest in April. We will arrange.        Other Visit Diagnoses    Routine general medical examination at a health care facility    -  Primary   Vaccines up to date. Screening labs checked yesterday. Colonoscopy due in March. Continue diet and exercise. Call with any concerns.    Screening for  colon cancer       Due for repeat in March- referral generated today.   Relevant Orders   Ambulatory referral to Gastroenterology       Discussed aspirin  prophylaxis for myocardial infarction prevention and decision was it was not indicated  LABORATORY TESTING:  Health maintenance labs ordered today as discussed above.   The natural history of prostate cancer and ongoing controversy regarding screening and potential treatment outcomes of prostate cancer has been discussed with the patient. The meaning of a false positive PSA and a false negative PSA has been discussed. He indicates understanding of the limitations of this screening test and wishes to proceed with screening PSA testing.   IMMUNIZATIONS:   - Tdap: Tetanus vaccination status reviewed: last tetanus booster within 10 years. - Influenza: Up to date - Pneumovax: Up to date - Prevnar: Up to date  SCREENING: - Colonoscopy: Ordered today  Discussed with patient purpose of the colonoscopy is to detect colon cancer at curable precancerous or early stages    PATIENT COUNSELING:    Sexuality: Discussed sexually transmitted diseases, partner selection, use of condoms, avoidance of unintended pregnancy  and contraceptive alternatives.   Advised to avoid cigarette smoking.  I discussed with the patient that most people either abstain from alcohol or drink within safe limits (<=14/week and <=4 drinks/occasion for males, <=7/weeks and <= 3 drinks/occasion for females) and that the risk for alcohol disorders and other health effects rises proportionally with the number of drinks per week and how often a drinker exceeds daily limits.  Discussed cessation/primary prevention of drug use and availability of treatment for abuse.   Diet: Encouraged to adjust caloric intake to maintain  or achieve ideal body weight, to reduce intake of dietary saturated fat and total fat, to limit sodium intake by avoiding high sodium foods and not adding  table salt, and to maintain adequate dietary potassium and calcium preferably from fresh fruits, vegetables, and low-fat dairy products.    stressed the importance of regular exercise  Injury prevention: Discussed safety belts, safety helmets, smoke detector, smoking near bedding or upholstery.   Dental health: Discussed importance of regular tooth brushing, flossing, and dental visits.   Follow up plan: NEXT PREVENTATIVE PHYSICAL DUE IN 1 YEAR. Return in about 1 year (around 07/14/2019) for Physical.

## 2018-07-15 ENCOUNTER — Telehealth: Payer: Self-pay

## 2018-07-15 NOTE — Telephone Encounter (Signed)
Gastroenterology Pre-Procedure Review  Request Date: 07/22/18 Requesting Physician: Dr. Vicente Males  PATIENT REVIEW QUESTIONS: The patient responded to the following health history questions as indicated:    1. Are you having any GI issues? no 2. Do you have a personal history of Polyps? yes (5 years ago in [place] ) 3. Do you have a family history of Colon Cancer or Polyps? yes (sister polyps) 4. Diabetes Mellitus? no 5. Joint replacements in the past 12 months?no 6. Major health problems in the past 3 months?no 7. Any artificial heart valves, MVP, or defibrillator?no    MEDICATIONS & ALLERGIES:    Patient reports the following regarding taking any anticoagulation/antiplatelet therapy:   Plavix, Coumadin, Eliquis, Xarelto, Lovenox, Pradaxa, Brilinta, or Effient? no Aspirin? no  Patient confirms/reports the following medications:  Current Outpatient Medications  Medication Sig Dispense Refill  . amLODipine (NORVASC) 5 MG tablet Take 1 tablet (5 mg total) by mouth daily. 90 tablet 3  . Ascorbic Acid (VITAMIN C) 1000 MG tablet Take 1,000 mg by mouth daily.    . Cholecalciferol (VITAMIN D3) 3000 units TABS Take 1,000 Units by mouth.    . Cyanocobalamin (VITAMIN B 12 PO) Take 3,000 mcg by mouth daily.    . Glucosamine-Chondroit-Vit C-Mn (GLUCOSAMINE 1500 COMPLEX PO) Take 1,500 mg by mouth 2 (two) times daily.    . Methylsulfonylmethane (MSM) 1500 MG TABS Take 1,500 mg by mouth 2 (two) times daily.    . Multiple Vitamin (MULTIVITAMIN) tablet Take 1 tablet by mouth daily.    . Omega-3 Fatty Acids (FISH OIL) 1200 MG CAPS Take 1,200 mg by mouth 2 (two) times daily.     No current facility-administered medications for this visit.     Patient confirms/reports the following allergies:  No Known Allergies  No orders of the defined types were placed in this encounter.   AUTHORIZATION INFORMATION Primary Insurance: 1D#: Group #:  Secondary Insurance: 1D#: Group #:  SCHEDULE  INFORMATION: Date: 07/22/18 Time: Location:[facility]

## 2018-07-20 ENCOUNTER — Telehealth: Payer: Self-pay

## 2018-07-20 ENCOUNTER — Telehealth: Payer: Self-pay | Admitting: Gastroenterology

## 2018-07-20 ENCOUNTER — Other Ambulatory Visit: Payer: Self-pay

## 2018-07-20 DIAGNOSIS — Z86010 Personal history of colonic polyps: Secondary | ICD-10-CM

## 2018-07-20 NOTE — Telephone Encounter (Signed)
Colonoscopy has been rescheduled to April 10th with Dr. Vicente Males at [facility].  Thanks [employer]

## 2018-07-20 NOTE — Telephone Encounter (Signed)
PT LEFT VM TO RESCHEDULE HIS PROCEDURE 07/22/18

## 2018-07-20 NOTE — Telephone Encounter (Signed)
Called and left patient a VM letting him know we would try to call him again tomorrow as Dr. Jeananne Rama is out this afternoon.

## 2018-07-20 NOTE — Telephone Encounter (Signed)
-----   Message from Guadalupe Maple, MD sent at 07/19/2018  8:28 AM EST ----- Regarding: call pt  ----- Message ----- From: Valerie Roys, DO Sent: 07/13/2018  10:24 AM EST To: Guadalupe Maple, MD  Had a kidney stone last year- wanted to know if he needs to follow up with urology before his [place] physical in April for a letter or if he should be good- hasn't had any issues with it since last year.

## 2018-07-21 NOTE — Telephone Encounter (Signed)
Phone call

## 2018-07-22 ENCOUNTER — Other Ambulatory Visit: Payer: Self-pay | Admitting: Urology

## 2018-07-22 ENCOUNTER — Ambulatory Visit: Admit: 2018-07-22 | Payer: Medicare Other | Admitting: Gastroenterology

## 2018-07-22 ENCOUNTER — Telehealth: Payer: Self-pay | Admitting: Urology

## 2018-07-22 DIAGNOSIS — N200 Calculus of kidney: Secondary | ICD-10-CM

## 2018-07-22 SURGERY — COLONOSCOPY WITH PROPOFOL
Anesthesia: General

## 2018-07-22 NOTE — Progress Notes (Signed)
KUB order is in .  

## 2018-07-22 NOTE — Telephone Encounter (Signed)
Can you put an order in for the KUB please?

## 2018-07-27 ENCOUNTER — Telehealth: Payer: Self-pay | Admitting: Family Medicine

## 2018-07-27 DIAGNOSIS — R918 Other nonspecific abnormal finding of lung field: Secondary | ICD-10-CM

## 2018-07-27 NOTE — Telephone Encounter (Signed)
-----   Message from Valerie Roys, DO sent at 08/26/2017 10:42 AM EDT ----- Needs follow up CT to recheck lung nodules

## 2018-07-27 NOTE — Telephone Encounter (Signed)
Patient notified and verbalized understanding. 

## 2018-07-27 NOTE — Telephone Encounter (Signed)
Follow up CT ordered today- please let patient know that they will be calling him to follow up on the lung nodules seen on the CT abdomen at his urologist last year

## 2018-08-03 ENCOUNTER — Ambulatory Visit
Admission: RE | Admit: 2018-08-03 | Discharge: 2018-08-03 | Disposition: A | Discharge status: Home / Self Care | Payer: Medicare Other | Source: Home / Self Care | Attending: Urology | Admitting: Urology

## 2018-08-03 ENCOUNTER — Ambulatory Visit
Admission: RE | Admit: 2018-08-03 | Discharge: 2018-08-03 | Disposition: A | Discharge status: Home / Self Care | Payer: Medicare Other | Source: Ambulatory Visit | Attending: Urology | Admitting: Urology

## 2018-08-03 ENCOUNTER — Ambulatory Visit
Admission: RE | Admit: 2018-08-03 | Discharge: 2018-08-03 | Disposition: A | Discharge status: Home / Self Care | Payer: Medicare Other | Source: Ambulatory Visit | Attending: Family Medicine | Admitting: Family Medicine

## 2018-08-03 ENCOUNTER — Other Ambulatory Visit: Payer: Self-pay

## 2018-08-03 DIAGNOSIS — R918 Other nonspecific abnormal finding of lung field: Secondary | ICD-10-CM | POA: Diagnosis not present

## 2018-08-03 DIAGNOSIS — N200 Calculus of kidney: Secondary | ICD-10-CM | POA: Diagnosis not present

## 2018-08-10 ENCOUNTER — Ambulatory Visit: Payer: Medicare Other

## 2018-08-11 ENCOUNTER — Ambulatory Visit: Payer: Medicare Other | Admitting: Urology

## 2018-08-11 ENCOUNTER — Telehealth: Payer: Self-pay

## 2018-08-11 ENCOUNTER — Encounter: Payer: Self-pay | Admitting: Urology

## 2018-08-11 ENCOUNTER — Ambulatory Visit (INDEPENDENT_AMBULATORY_CARE_PROVIDER_SITE_OTHER): Payer: Medicare Other | Admitting: Urology

## 2018-08-11 ENCOUNTER — Other Ambulatory Visit: Payer: Self-pay

## 2018-08-11 ENCOUNTER — Encounter: Payer: Self-pay | Admitting: *Deleted

## 2018-08-11 VITALS — BP 135/81 | HR 77 | Ht 75.0 in | Wt 258.0 lb

## 2018-08-11 DIAGNOSIS — R3129 Other microscopic hematuria: Secondary | ICD-10-CM | POA: Diagnosis not present

## 2018-08-11 DIAGNOSIS — N200 Calculus of kidney: Secondary | ICD-10-CM

## 2018-08-11 LAB — URINALYSIS, COMPLETE
Bilirubin, UA: NEGATIVE
Glucose, UA: NEGATIVE
Ketones, UA: NEGATIVE
Leukocytes, UA: NEGATIVE
Nitrite, UA: NEGATIVE
Protein, UA: NEGATIVE
RBC, UA: NEGATIVE
Specific Gravity, UA: 1.02 (ref 1.005–1.030)
UUROB: 0.2 mg/dL (ref 0.2–1.0)
pH, UA: 5.5 (ref 5.0–7.5)

## 2018-08-11 NOTE — Telephone Encounter (Signed)
Patients colonoscopy has been rescheduled from 08/27/18 to 10/27/18 with Dr. Vicente Males.  This is a correction.  Thanks [employer]

## 2018-08-11 NOTE — Progress Notes (Addendum)
08/11/2018 8:51 AM   Zachary Rogers. Jan 30, 1951 [id number]  Referring provider: Valerie Roys, DO [place], [place] [address]  Chief Complaint  Patient presents with  . Nephrolithiasis    HPI: Patient is a 68 year old Caucasian male with a history of nephrolithiasis who presents today as a yearly follow up.    CT Renal stone study on 08/20/2017 noted mild right hydronephrosis and hydroureter, secondary to a 5 mm stone at the right UVJ.   Additional stones present within the right left kidneys.  His UA was positive for TNTC RBC's.  WBC count was 7.9.  Serum creatinine was 1.43.  He subsequently passed that stone.    KUB 08/26/2017 the right UVJ stone is no longer visible.   Very small punctate stone is located in the lower pole of each kidney.    RUS on 09/08/2017 revealed interval clearing of previously noted right-sided hydronephrosis.    KUB on 08/03/2018 revealed punctate right midpole nephrolithiasis.  Over the last year, he had not had any stone instances.  Today, he has no complaints.  Patient denies any gross hematuria, dysuria or suprapubic/flank pain.  Patient denies any fevers, chills, nausea or vomiting.  His UA today is negative.      PMH: Past Medical History:  Diagnosis Date  . Hypertension     Surgical History: Past Surgical History:  Procedure Laterality Date  . CHOLECYSTECTOMY  06/07/1998  . SKIN LESION EXCISION     dermatlogy - precancerous lesion 1 year ago on back     Home Medications:  Allergies as of 08/11/2018   No Known Allergies     Medication List       Accurate as of August 11, 2018  8:51 AM. Always use your most recent med list.        amLODipine 5 MG tablet Commonly known as:  NORVASC Take 1 tablet (5 mg total) by mouth daily.   Fish Oil 1200 MG Caps Take 1,200 mg by mouth 2 (two) times daily.   GLUCOSAMINE 1500 COMPLEX PO Take 1,500 mg by mouth 2 (two) times daily.   MSM 1500 MG Tabs Take 1,500 mg by mouth 2  (two) times daily.   multivitamin tablet Take 1 tablet by mouth daily.   VITAMIN B 12 PO Take 3,000 mcg by mouth daily.   vitamin C 1000 MG tablet Take 1,000 mg by mouth daily.   Vitamin D3 75 MCG (3000 UT) Tabs Take 1,000 Units by mouth.       Allergies: No Known Allergies  Family History: Family History  Problem Relation Age of Onset  . Lung cancer Mother   . Hypertension Father   . Diabetes Father   . Prostate cancer Neg Hx   . Bladder Cancer Neg Hx   . Kidney cancer Neg Hx     Social History:  reports that he has quit smoking. He has never used smokeless tobacco. He reports current alcohol use of about 2.0 - 3.0 standard drinks of alcohol per week. He reports that he does not use drugs.  ROS: UROLOGY Frequent Urination?: No Hard to postpone urination?: No Burning/pain with urination?: No Get up at night to urinate?: No Leakage of urine?: No Urine stream starts and stops?: No Trouble starting stream?: No Do you have to strain to urinate?: No Blood in urine?: No Urinary tract infection?: No Sexually transmitted disease?: No Injury to kidneys or bladder?: No Painful intercourse?: No Weak stream?: No Erection  problems?: No Penile pain?: No  Gastrointestinal Nausea?: No Vomiting?: No Indigestion/heartburn?: No Diarrhea?: No Constipation?: No  Constitutional Fever: No Night sweats?: No Weight loss?: No Fatigue?: No  Skin Skin rash/lesions?: No Itching?: No  Eyes Blurred vision?: No Double vision?: No  Ears/Nose/Throat Sore throat?: No Sinus problems?: No  Hematologic/Lymphatic Swollen glands?: No Easy bruising?: No  Cardiovascular Leg swelling?: No Chest pain?: No  Respiratory Cough?: No Shortness of breath?: No  Endocrine Excessive thirst?: No  Musculoskeletal Back pain?: No Joint pain?: No  Neurological Headaches?: No Dizziness?: No  Psychologic Depression?: No Anxiety?: No  Physical Exam: BP 135/81   Pulse 77    Ht 6\' 3"  (1.905 m)   Wt 258 lb (117 kg)   BMI 32.25 kg/m   Constitutional:  Well nourished. Alert and oriented, No acute distress. HEENT: [place] AT, moist mucus membranes.  Trachea midline, no masses. Cardiovascular: No clubbing, cyanosis, or edema. Respiratory: Normal respiratory effort, no increased work of breathing. Neurologic: Grossly intact, no focal deficits, moving all 4 extremities. Psychiatric: Normal mood and affect.  Laboratory Data: Lab Results  Component Value Date   WBC 5.1 07/12/2018   HGB 16.7 07/12/2018   HCT 47.2 07/12/2018   MCV 88 07/12/2018   PLT 189 07/12/2018    Lab Results  Component Value Date   CREATININE 1.12 07/12/2018    No results found for: PSA  No results found for: TESTOSTERONE  No results found for: HGBA1C  Lab Results  Component Value Date   TSH 1.800 07/12/2018       Component Value Date/Time   CHOL 228 (H) 07/12/2018 1156   HDL 74 07/12/2018 1156   LDLCALC 126 (H) 07/12/2018 1156    Lab Results  Component Value Date   AST 27 07/12/2018   Lab Results  Component Value Date   ALT 43 07/12/2018   No components found for: ALKALINEPHOPHATASE No components found for: BILIRUBINTOTAL  No results found for: ESTRADIOL  Urinalysis Negative.  See Epic.   I have reviewed the labs.   Pertinent Imaging:  CLINICAL DATA:  Bilateral nephrolithiasis  EXAM: ABDOMEN - 1 VIEW  COMPARISON:  CT 08/20/2017  FINDINGS: Punctate stone projects over the midpole of the right kidney. No definite visible stones within the left kidney or expected course of the ureters. Nonobstructive bowel gas pattern. Prior cholecystectomy.  IMPRESSION: Punctate right midpole nephrolithiasis.   Electronically Signed   By: Rolm Baptise M.D.   On: 08/03/2018 20:48  I have independently reviewed the films and note the punctate right stone  Assessment & Plan:    1. Right renal stone Punctate right renal stone - no intervention warranted as  patient is asymptomatic and the punctate stone does not present a risk for a sudden incapacitating event    Return in about 1 year (around 08/11/2019) for KUB and UA .  These notes generated with voice recognition software. I apologize for typographical errors.  Zara Council, PA-C  [facility] [address] [place], [place] [address] [phone]

## 2018-08-11 NOTE — Telephone Encounter (Signed)
Patients colonoscopy has been rescheduled form 08/27/18 to 10/27/18 with Dr.Anna at [facility].  Thanks [employer]

## 2018-08-18 ENCOUNTER — Encounter: Payer: Medicare Other | Admitting: Family Medicine

## 2018-08-27 ENCOUNTER — Encounter: Admission: RE | Payer: Self-pay | Source: Home / Self Care

## 2018-08-27 ENCOUNTER — Ambulatory Visit: Admission: RE | Admit: 2018-08-27 | Payer: Medicare Other | Source: Home / Self Care | Admitting: Gastroenterology

## 2018-08-27 SURGERY — COLONOSCOPY WITH PROPOFOL
Anesthesia: General

## 2018-10-18 ENCOUNTER — Other Ambulatory Visit: Payer: Self-pay

## 2018-10-18 DIAGNOSIS — Z86010 Personal history of colonic polyps: Secondary | ICD-10-CM

## 2018-10-21 ENCOUNTER — Other Ambulatory Visit: Payer: Medicare Other

## 2018-10-27 ENCOUNTER — Ambulatory Visit: Admit: 2018-10-27 | Payer: Medicare Other | Admitting: Gastroenterology

## 2018-11-05 ENCOUNTER — Other Ambulatory Visit: Payer: Self-pay

## 2018-11-05 ENCOUNTER — Other Ambulatory Visit
Admission: RE | Admit: 2018-11-05 | Discharge: 2018-11-05 | Disposition: A | Discharge status: Home / Self Care | Payer: Medicare Other | Source: Ambulatory Visit | Attending: Gastroenterology | Admitting: Gastroenterology

## 2018-11-05 DIAGNOSIS — Z1159 Encounter for screening for other viral diseases: Secondary | ICD-10-CM | POA: Insufficient documentation

## 2018-11-06 LAB — NOVEL CORONAVIRUS, NAA (HOSP ORDER, SEND-OUT TO REF LAB; TAT 18-24 HRS): SARS-CoV-2, NAA: NOT DETECTED

## 2018-11-09 ENCOUNTER — Ambulatory Visit: Payer: Medicare Other | Admitting: Certified Registered"

## 2018-11-09 ENCOUNTER — Other Ambulatory Visit: Payer: Self-pay

## 2018-11-09 ENCOUNTER — Encounter
Admission: RE | Disposition: A | Discharge status: Home / Self Care | Payer: Self-pay | Source: Ambulatory Visit | Attending: Gastroenterology

## 2018-11-09 ENCOUNTER — Encounter: Payer: Self-pay | Admitting: *Deleted

## 2018-11-09 ENCOUNTER — Ambulatory Visit
Admission: RE | Admit: 2018-11-09 | Discharge: 2018-11-09 | Disposition: A | Discharge status: Home / Self Care | Payer: Medicare Other | Source: Ambulatory Visit | Attending: Gastroenterology | Admitting: Gastroenterology

## 2018-11-09 DIAGNOSIS — I10 Essential (primary) hypertension: Secondary | ICD-10-CM | POA: Insufficient documentation

## 2018-11-09 DIAGNOSIS — Z87891 Personal history of nicotine dependence: Secondary | ICD-10-CM | POA: Diagnosis not present

## 2018-11-09 DIAGNOSIS — Z86010 Personal history of colonic polyps: Secondary | ICD-10-CM | POA: Insufficient documentation

## 2018-11-09 DIAGNOSIS — K5790 Diverticulosis of intestine, part unspecified, without perforation or abscess without bleeding: Secondary | ICD-10-CM | POA: Diagnosis not present

## 2018-11-09 DIAGNOSIS — K5730 Diverticulosis of large intestine without perforation or abscess without bleeding: Secondary | ICD-10-CM | POA: Insufficient documentation

## 2018-11-09 DIAGNOSIS — Z79899 Other long term (current) drug therapy: Secondary | ICD-10-CM | POA: Diagnosis not present

## 2018-11-09 DIAGNOSIS — Z1211 Encounter for screening for malignant neoplasm of colon: Secondary | ICD-10-CM | POA: Diagnosis not present

## 2018-11-09 HISTORY — PX: COLONOSCOPY WITH PROPOFOL: SHX5780

## 2018-11-09 SURGERY — COLONOSCOPY WITH PROPOFOL
Anesthesia: General

## 2018-11-09 MED ORDER — PROPOFOL 10 MG/ML IV BOLUS
INTRAVENOUS | Status: AC
  Filled 2018-11-09: qty 20

## 2018-11-09 MED ORDER — PROPOFOL 500 MG/50ML IV EMUL
INTRAVENOUS | Status: DC | PRN
  Administered 2018-11-09: 175 ug/kg/min via INTRAVENOUS

## 2018-11-09 MED ORDER — PROPOFOL 10 MG/ML IV BOLUS
INTRAVENOUS | Status: DC | PRN
  Administered 2018-11-09: 70 mg via INTRAVENOUS

## 2018-11-09 MED ORDER — SODIUM CHLORIDE 0.9 % IV SOLN
INTRAVENOUS | Status: DC
  Administered 2018-11-09: 09:00:00 via INTRAVENOUS

## 2018-11-09 MED ORDER — PROPOFOL 500 MG/50ML IV EMUL
INTRAVENOUS | Status: AC
  Filled 2018-11-09: qty 50

## 2018-11-09 NOTE — Anesthesia Postprocedure Evaluation (Signed)
Anesthesia Post Note  Patient: Zachary Rogers.  Procedure(s) Performed: COLONOSCOPY WITH PROPOFOL (N/A )  Patient location during evaluation: Endoscopy Anesthesia Type: General Level of consciousness: awake and alert Pain management: pain level controlled Vital Signs Assessment: post-procedure vital signs reviewed and stable Respiratory status: spontaneous breathing, nonlabored ventilation, respiratory function stable and patient connected to nasal cannula oxygen Cardiovascular status: blood pressure returned to baseline and stable Postop Assessment: no apparent nausea or vomiting Anesthetic complications: no     Last Vitals:  Vitals:   11/09/18 1006 11/09/18 1036  BP:  138/68  Pulse:    Resp:    Temp: (!) 36.3 C   SpO2: 99%     Last Pain:  Vitals:   11/09/18 1026  TempSrc:   PainSc: 0-No pain                 Layten Aiken S

## 2018-11-09 NOTE — Transfer of Care (Signed)
Immediate Anesthesia Transfer of Care Note  Patient: Zachary Rogers.  Procedure(s) Performed: COLONOSCOPY WITH PROPOFOL (N/A )  Patient Location: PACU  Anesthesia Type:General  Level of Consciousness: awake, alert  and oriented  Airway & Oxygen Therapy: Patient Spontanous Breathing and Patient connected to nasal cannula oxygen  Post-op Assessment: Report given to RN and Post -op Vital signs reviewed and stable  Post vital signs: Reviewed and stable  Last Vitals:  Vitals Value Taken Time  BP    Temp    Pulse    Resp    SpO2      Last Pain:  Vitals:   11/09/18 0912  TempSrc: Tympanic  PainSc: 0-No pain         Complications: No apparent anesthesia complications

## 2018-11-09 NOTE — H&P (Signed)
Zachary Bellows, MD [address], [place], [place], [place], [address] [address], [place], [place], [place], [address] Phone: [phone]  Fax: [phone]  Primary Care Physician:  Valerie Roys, DO   Pre-Procedure History & Physical: HPI:  Zachary Rogers. is a 68 y.o. male is here for an colonoscopy.   Past Medical History:  Diagnosis Date  . Hypertension     Past Surgical History:  Procedure Laterality Date  . CHOLECYSTECTOMY  06/07/1998  . COLONOSCOPY    . SKIN LESION EXCISION     dermatlogy - precancerous lesion 1 year ago on back     Prior to Admission medications   Medication Sig Start Date End Date Taking? Authorizing Provider  amLODipine (NORVASC) 5 MG tablet Take 1 tablet (5 mg total) by mouth daily. 07/13/18  Yes Johnson, Megan P, DO  Ascorbic Acid (VITAMIN C) 1000 MG tablet Take 1,000 mg by mouth daily.   Yes [provider]  Cholecalciferol (VITAMIN D3) 3000 units TABS Take 1,000 Units by mouth.   Yes [provider]  Cyanocobalamin (VITAMIN B 12 PO) Take 3,000 mcg by mouth daily.   Yes [provider]  Glucosamine-Chondroit-Vit C-Mn (GLUCOSAMINE 1500 COMPLEX PO) Take 1,500 mg by mouth 2 (two) times daily.   Yes [provider]  Methylsulfonylmethane (MSM) 1500 MG TABS Take 1,500 mg by mouth 2 (two) times daily.   Yes [provider]  Multiple Vitamin (MULTIVITAMIN) tablet Take 1 tablet by mouth daily.   Yes [provider]  Omega-3 Fatty Acids (FISH OIL) 1200 MG CAPS Take 1,200 mg by mouth 2 (two) times daily.   Yes [provider]    Allergies as of 10/18/2018  . (No Known Allergies)    Family History  Problem Relation Age of Onset  . Lung cancer Mother   . Hypertension Father   . Diabetes Father   . Prostate cancer Neg Hx   . Bladder Cancer Neg Hx   . Kidney cancer Neg Hx     Social History   Socioeconomic History  . Marital status: Married    Spouse name: Not on file  .  Number of children: Not on file  . Years of education: Not on file  . Highest education level: Master's degree (e.g., MA, MS, MEng, MEd, MSW, MBA)  Occupational History  . Occupation: working Lexicographer  . Financial resource strain: Not hard at all  . Food insecurity    Worry: Never true    Inability: Never true  . Transportation needs    Medical: No    Non-medical: No  Tobacco Use  . Smoking status: Former Smoker    Quit date: 2000    Years since quitting: 20.4  . Smokeless tobacco: Never Used  . Tobacco comment: social smoker when younger   Substance and Sexual Activity  . Alcohol use: Yes    Alcohol/week: 2.0 - 3.0 standard drinks    Types: 2 - 3 Shots of liquor per week  . Drug use: No  . Sexual activity: Yes  Lifestyle  . Physical activity    Days per week: 0 days    Minutes per session: 0 min  . Stress: Not at all  Relationships  . Social connections    Talks on phone: More than three times a week    Gets together: More than three times a week    Attends religious service: Never    Active member of club  or organization: Yes    Attends meetings of clubs or organizations: More than 4 times per year    Relationship status: Married  . Intimate partner violence    Fear of current or ex partner: No    Emotionally abused: No    Physically abused: No    Forced sexual activity: No  Other Topics Concern  . Not on file  Social History Theatre stage manager, local friends.     Review of Systems: See HPI, otherwise negative ROS  Physical Exam: BP 137/80   Pulse (!) 52   Temp 97.8 F (36.6 C) (Tympanic)   Resp 16   Ht 6\' 3"  (1.905 m)   Wt 118.8 kg   SpO2 98%   BMI 32.75 kg/m  General:   Alert,  pleasant and cooperative in NAD Head:  Normocephalic and atraumatic. Neck:  Supple; no masses or thyromegaly. Lungs:  Clear throughout to auscultation, normal respiratory effort.    Heart:  +S1, +S2, Regular rate and rhythm, No edema. Abdomen:   Soft, nontender and nondistended. Normal bowel sounds, without guarding, and without rebound.   Neurologic:  Alert and  oriented x4;  grossly normal neurologically.  Impression/Plan: [name] is here for an colonoscopy to be performed for surveillance due to prior history of colon polyps   Risks, benefits, limitations, and alternatives regarding  colonoscopy have been reviewed with the patient.  Questions have been answered.  All parties agreeable.   Zachary Bellows, MD  11/09/2018, 9:44 AM

## 2018-11-09 NOTE — Anesthesia Post-op Follow-up Note (Signed)
Anesthesia QCDR form completed.        

## 2018-11-09 NOTE — Op Note (Signed)
[facility] Gastroenterology Patient Name: Zachary Rogers Procedure Date: 11/09/2018 9:42 AM MRN: [id number] Account #: [id number] Date of Birth: 1950/10/07 Admit Type: Outpatient Age: 68 Room: [facility] ENDO ROOM 3 Gender: Male Note Status: Finalized Procedure:            Colonoscopy Indications:          High risk colon cancer surveillance: Personal history                        of colonic polyps Providers:            [name] MD, MD Medicines:            Monitored Anesthesia Care Complications:        No immediate complications. Procedure:            Pre-Anesthesia Assessment:                       - Prior to the procedure, a History and Physical was                        performed, and patient medications, allergies and                        sensitivities were reviewed. The patient's tolerance of                        previous anesthesia was reviewed.                       - The risks and benefits of the procedure and the                        sedation options and risks were discussed with the                        patient. All questions were answered and informed                        consent was obtained.                       - ASA Grade Assessment: II - A patient with mild                        systemic disease.                       After obtaining informed consent, the colonoscope was                        passed under direct vision. Throughout the procedure,                        the patient's blood pressure, pulse, and oxygen                        saturations were monitored continuously. The                        Colonoscope was introduced through the anus and  advanced to the the cecum, identified by the                        appendiceal orifice, IC valve and transillumination.                        The colonoscopy was performed with ease. The patient                        tolerated the procedure well. The quality of  the bowel                        preparation was good. Findings:      The perianal and digital rectal examinations were normal.      Multiple small-mouthed diverticula were found in the left colon.      The exam was otherwise without abnormality on direct and retroflexion       views. Impression:           - Diverticulosis in the left colon.                       - The examination was otherwise normal on direct and                        retroflexion views.                       - No specimens collected. Recommendation:       - Discharge patient to home (with escort).                       - Resume previous diet.                       - Continue present medications.                       - Repeat colonoscopy in 5 years for surveillance. Procedure Code(s):    --- Professional ---                       [phone], Colonoscopy, flexible; diagnostic, including                        collection of specimen(s) by brushing or washing, when                        performed (separate procedure) Diagnosis Code(s):    --- Professional ---                       Z86.010, Personal history of colonic polyps                       K57.30, Diverticulosis of large intestine without                        perforation or abscess without bleeding CPT copyright 2019 American Medical Association. All rights reserved. The codes documented in this report are preliminary and upon coder review may  be revised to meet current compliance requirements. [name], MD [name] MD, MD 11/09/2018 10:03:55 AM This report has been  signed electronically. Number of Addenda: 0 Note Initiated On: 11/09/2018 9:42 AM Scope Withdrawal Time: 0 hours 8 minutes 40 seconds  Total Procedure Duration: 0 hours 11 minutes 36 seconds  Estimated Blood Loss: Estimated blood loss: none.      [facility]

## 2018-11-09 NOTE — Anesthesia Preprocedure Evaluation (Signed)
Anesthesia Evaluation  Patient identified by MRN, date of birth, ID band Patient awake    Reviewed: Allergy & Precautions, NPO status , Patient's Chart, lab work & pertinent test results, reviewed documented beta blocker date and time   Airway Mallampati: III  TM Distance: >3 FB     Dental  (+) Chipped   Pulmonary former smoker,           Cardiovascular hypertension, Pt. on medications      Neuro/Psych    GI/Hepatic   Endo/Other    Renal/GU Renal disease     Musculoskeletal   Abdominal   Peds  Hematology   Anesthesia Other Findings   Reproductive/Obstetrics                             Anesthesia Physical Anesthesia Plan  ASA: III  Anesthesia Plan: General   Post-op Pain Management:    Induction: Intravenous  PONV Risk Score and Plan:   Airway Management Planned:   Additional Equipment:   Intra-op Plan:   Post-operative Plan:   Informed Consent: I have reviewed the patients History and Physical, chart, labs and discussed the procedure including the risks, benefits and alternatives for the proposed anesthesia with the patient or authorized representative who has indicated his/her understanding and acceptance.       Plan Discussed with: CRNA  Anesthesia Plan Comments:         Anesthesia Quick Evaluation

## 2018-11-10 ENCOUNTER — Encounter: Payer: Self-pay | Admitting: Gastroenterology

## 2018-12-16 ENCOUNTER — Other Ambulatory Visit: Payer: Self-pay

## 2019-03-15 DIAGNOSIS — Z23 Encounter for immunization: Secondary | ICD-10-CM | POA: Diagnosis not present

## 2019-04-05 ENCOUNTER — Other Ambulatory Visit: Payer: Self-pay

## 2019-04-05 DIAGNOSIS — Z20822 Contact with and (suspected) exposure to covid-19: Secondary | ICD-10-CM

## 2019-04-07 LAB — NOVEL CORONAVIRUS, NAA: SARS-CoV-2, NAA: NOT DETECTED

## 2019-07-18 ENCOUNTER — Encounter: Payer: Self-pay | Admitting: Family Medicine

## 2019-07-18 ENCOUNTER — Ambulatory Visit (INDEPENDENT_AMBULATORY_CARE_PROVIDER_SITE_OTHER): Payer: Medicare Other

## 2019-07-18 ENCOUNTER — Ambulatory Visit (INDEPENDENT_AMBULATORY_CARE_PROVIDER_SITE_OTHER): Payer: Medicare Other | Admitting: Family Medicine

## 2019-07-18 ENCOUNTER — Other Ambulatory Visit: Payer: Self-pay

## 2019-07-18 VITALS — BP 149/89 | HR 58 | Temp 97.8°F | Ht 75.0 in | Wt 276.0 lb

## 2019-07-18 VITALS — BP 136/70 | HR 58 | Temp 97.8°F | Ht 75.0 in | Wt 276.0 lb

## 2019-07-18 DIAGNOSIS — Z125 Encounter for screening for malignant neoplasm of prostate: Secondary | ICD-10-CM

## 2019-07-18 DIAGNOSIS — I700 Atherosclerosis of aorta: Secondary | ICD-10-CM | POA: Insufficient documentation

## 2019-07-18 DIAGNOSIS — Z0000 Encounter for general adult medical examination without abnormal findings: Secondary | ICD-10-CM

## 2019-07-18 DIAGNOSIS — I10 Essential (primary) hypertension: Secondary | ICD-10-CM | POA: Diagnosis not present

## 2019-07-18 DIAGNOSIS — E785 Hyperlipidemia, unspecified: Secondary | ICD-10-CM | POA: Insufficient documentation

## 2019-07-18 DIAGNOSIS — R3911 Hesitancy of micturition: Secondary | ICD-10-CM | POA: Diagnosis not present

## 2019-07-18 DIAGNOSIS — E782 Mixed hyperlipidemia: Secondary | ICD-10-CM | POA: Diagnosis not present

## 2019-07-18 DIAGNOSIS — N200 Calculus of kidney: Secondary | ICD-10-CM

## 2019-07-18 DIAGNOSIS — I2510 Atherosclerotic heart disease of native coronary artery without angina pectoris: Secondary | ICD-10-CM | POA: Diagnosis not present

## 2019-07-18 HISTORY — DX: Mixed hyperlipidemia: E78.2

## 2019-07-18 LAB — MICROALBUMIN, URINE WAIVED
Creatinine, Urine Waived: 200 mg/dL (ref 10–300)
Microalb, Ur Waived: 30 mg/L — ABNORMAL HIGH (ref 0–19)
Microalb/Creat Ratio: <30 mg/g (ref <30)

## 2019-07-18 LAB — UA/M W/RFLX CULTURE, ROUTINE
Bilirubin, UA: NEGATIVE
Glucose, UA: NEGATIVE
Ketones, UA: NEGATIVE
Leukocytes,UA: NEGATIVE
Nitrite, UA: NEGATIVE
Protein,UA: NEGATIVE
RBC, UA: NEGATIVE
Specific Gravity, UA: >1.03 — ABNORMAL HIGH (ref 1.005–1.030)
Urobilinogen, Ur: 1 mg/dL (ref 0.2–1.0)
pH, UA: 5.5 (ref 5.0–7.5)

## 2019-07-18 MED ORDER — AMLODIPINE BESYLATE 5 MG PO TABS: 5.0000 mg | ORAL_TABLET | Freq: Every day | ORAL | 2 refills | Status: DC

## 2019-07-18 MED ORDER — AMLODIPINE BESYLATE 5 MG PO TABS: 5.0000 mg | ORAL_TABLET | Freq: Every day | ORAL | 3 refills | Status: DC

## 2019-07-18 NOTE — Progress Notes (Signed)
BP 136/70 (BP Location: Left Arm, Cuff Size: Large)   Pulse (!) 58   Temp 97.8 F (36.6 C) (Oral)   Ht 6\' 3"  (1.905 m)   Wt 276 lb (125.2 kg)   SpO2 98%   BMI 34.50 kg/m    Subjective:    Patient ID: Zachary Rogers., male    DOB: [birth year], 69 y.o.   MRN: [id number]  HPI: Randy Whitus is a 69 y.o. male  Chief Complaint  Patient presents with  . Hyperlipidemia  . Hypertension   HYPERTENSION / HYPERLIPIDEMIA- has been out of his medicine for about 5 days Satisfied with current treatment? yes Duration of hypertension: chronic BP monitoring frequency: not checking BP medication side effects: no Past BP meds: amlodipine Duration of hyperlipidemia: chronic Cholesterol medication side effects: not on anything Cholesterol supplements: fish oil Past cholesterol medications: none Medication compliance: excellent compliance Aspirin: no Recent stressors: no Recurrent headaches: no Visual changes: no Palpitations: no Dyspnea: no Chest pain: no Lower extremity edema: no Dizzy/lightheaded: no  Relevant past medical, surgical, family and social history reviewed and updated as indicated. Interim medical history since our last visit reviewed. Allergies and medications reviewed and updated.  Review of Systems  Constitutional: Negative.   Respiratory: Negative.   Cardiovascular: Negative.   Musculoskeletal: Negative.   Neurological: Negative.   Psychiatric/Behavioral: Negative.     Per HPI unless specifically indicated above     Objective:    BP 136/70 (BP Location: Left Arm, Cuff Size: Large)   Pulse (!) 58   Temp 97.8 F (36.6 C) (Oral)   Ht 6\' 3"  (1.905 m)   Wt 276 lb (125.2 kg)   SpO2 98%   BMI 34.50 kg/m   Wt Readings from Last 3 Encounters:  07/18/19 276 lb (125.2 kg)  07/18/19 276 lb (125.2 kg)  11/09/18 262 lb (118.8 kg)    Physical Exam Vitals and nursing note reviewed.  Constitutional:      General: He is not in acute distress.  Appearance: Normal appearance. He is not ill-appearing, toxic-appearing or diaphoretic.  HENT:     Head: Normocephalic and atraumatic.     Right Ear: External ear normal.     Left Ear: External ear normal.     Nose: Nose normal.     Mouth/Throat:     Mouth: Mucous membranes are moist.     Pharynx: Oropharynx is clear.  Eyes:     General: No scleral icterus.       Right eye: No discharge.        Left eye: No discharge.     Extraocular Movements: Extraocular movements intact.     Conjunctiva/sclera: Conjunctivae normal.     Pupils: Pupils are equal, round, and reactive to light.  Cardiovascular:     Rate and Rhythm: Normal rate and regular rhythm.     Pulses: Normal pulses.     Heart sounds: Normal heart sounds. No murmur. No friction rub. No gallop.   Pulmonary:     Effort: Pulmonary effort is normal. No respiratory distress.     Breath sounds: Normal breath sounds. No stridor. No wheezing, rhonchi or rales.  Chest:     Chest wall: No tenderness.  Musculoskeletal:        General: Normal range of motion.     Cervical back: Normal range of motion and neck supple.  Skin:    General: Skin is warm and dry.     Capillary Refill: Capillary refill  takes less than 2 seconds.     Coloration: Skin is not jaundiced or pale.     Findings: No bruising, erythema, lesion or rash.  Neurological:     General: No focal deficit present.     Mental Status: He is alert and oriented to person, place, and time. Mental status is at baseline.  Psychiatric:        Mood and Affect: Mood normal.        Behavior: Behavior normal.        Thought Content: Thought content normal.        Judgment: Judgment normal.     Results for orders placed or performed in visit on 04/05/19  Novel Coronavirus, NAA ([facility])   Specimen: Oropharyngeal(OP) collection in vial transport medium   OROPHARYNGEA  TESTING  Result Value Ref Range   SARS-CoV-2, NAA Not Detected Not Detected      Assessment & Plan:    Problem List Items Addressed This Visit      Cardiovascular and Mediastinum   Hypertension - Primary    Under good control on current regimen. Continue current regimen. Continue to monitor. Call with any concerns. Refills given. Labs drawn today.       Relevant Medications   amLODipine (NORVASC) 5 MG tablet   Other Relevant Orders   CBC with Differential/Platelet   Comprehensive metabolic panel   Microalbumin, Urine Waived   TSH   Aortic atherosclerosis (HCC)    Will keep BP and cholesterol under good control. Offered referral to cardiology- will hold at this time. Continue to monitor.       Relevant Medications   amLODipine (NORVASC) 5 MG tablet   Coronary artery disease involving native coronary artery of native heart without angina pectoris    Will keep BP and cholesterol under good control. Offered referral to cardiology- will hold at this time. Continue to monitor.       Relevant Medications   amLODipine (NORVASC) 5 MG tablet     Genitourinary   Nephrolithiasis    Under good control on current regimen. Continue current regimen. Continue to monitor. Call with any concerns. Refills given. Labs drawn today.       Relevant Orders   CBC with Differential/Platelet   Comprehensive metabolic panel   UA/M w/rflx Culture, Routine     Other   Mixed hyperlipidemia    Rechecking levels today. Await results. Treat as needed.       Relevant Medications   amLODipine (NORVASC) 5 MG tablet   Other Relevant Orders   CBC with Differential/Platelet   Comprehensive metabolic panel   Lipid Panel w/o Chol/HDL Ratio    Other Visit Diagnoses    Screening for prostate cancer       Labs drawn today. Await results.    Relevant Orders   PSA       Follow up plan: Return in about 6 months (around 01/18/2020).

## 2019-07-18 NOTE — Patient Instructions (Signed)
[name] , Thank you for taking time to come for your Medicare Wellness Visit. I appreciate your ongoing commitment to your health goals. Please review the following plan we discussed and let me know if I can assist you in the future.   Screening recommendations/referrals: Colonoscopy: 2020, due 2025 Recommended yearly ophthalmology/optometry visit for glaucoma screening and checkup Recommended yearly dental visit for hygiene and checkup  Vaccinations: Influenza vaccine: up to date  Pneumococcal vaccine: up to date  Tdap vaccine: up to date  Shingles vaccine: up to date    Covid-19: completed   Advanced directives: Please bring a copy of your health care power of attorney and living will to the office at your convenience.  Conditions/risks identified: increase exercise.   Next appointment: Follow up in one year for your annual wellness visit   Preventive Care 65 Years and Older, Male Preventive care refers to lifestyle choices and visits with your health care provider that can promote health and wellness. What does preventive care include?  A yearly physical exam. This is also called an annual well check.  Dental exams once or twice a year.  Routine eye exams. Ask your health care provider how often you should have your eyes checked.  Personal lifestyle choices, including:  Daily care of your teeth and gums.  Regular physical activity.  Eating a healthy diet.  Avoiding tobacco and drug use.  Limiting alcohol use.  Practicing safe sex.  Taking low doses of aspirin every day.  Taking vitamin and mineral supplements as recommended by your health care provider. What happens during an annual well check? The services and screenings done by your health care provider during your annual well check will depend on your age, overall health, lifestyle risk factors, and family history of disease. Counseling  Your health care provider may ask you questions about your:  Alcohol  use.  Tobacco use.  Drug use.  Emotional well-being.  Home and relationship well-being.  Sexual activity.  Eating habits.  History of falls.  Memory and ability to understand (cognition).  Work and work Statistician. Screening  You may have the following tests or measurements:  Height, weight, and BMI.  Blood pressure.  Lipid and cholesterol levels. These may be checked every 5 years, or more frequently if you are over 30 years old.  Skin check.  Lung cancer screening. You may have this screening every year starting at age 25 if you have a 30-pack-year history of smoking and currently smoke or have quit within the past 15 years.  Fecal occult blood test (FOBT) of the stool. You may have this test every year starting at age 87.  Flexible sigmoidoscopy or colonoscopy. You may have a sigmoidoscopy every 5 years or a colonoscopy every 10 years starting at age 30.  Prostate cancer screening. Recommendations will vary depending on your family history and other risks.  Hepatitis C blood test.  Hepatitis B blood test.  Sexually transmitted disease (STD) testing.  Diabetes screening. This is done by checking your blood sugar (glucose) after you have not eaten for a while (fasting). You may have this done every 1-3 years.  Abdominal aortic aneurysm (AAA) screening. You may need this if you are a current or former smoker.  Osteoporosis. You may be screened starting at age 65 if you are at high risk. Talk with your health care provider about your test results, treatment options, and if necessary, the need for more tests. Vaccines  Your health care provider may recommend  certain vaccines, such as:  Influenza vaccine. This is recommended every year.  Tetanus, diphtheria, and acellular pertussis (Tdap, Td) vaccine. You may need a Td booster every 10 years.  Zoster vaccine. You may need this after age 84.  Pneumococcal 13-valent conjugate (PCV13) vaccine. One dose is  recommended after age 109.  Pneumococcal polysaccharide (PPSV23) vaccine. One dose is recommended after age 11. Talk to your health care provider about which screenings and vaccines you need and how often you need them. This information is not intended to replace advice given to you by your health care provider. Make sure you discuss any questions you have with your health care provider. Document Released: 06/01/2015 Document Revised: 01/23/2016 Document Reviewed: 03/06/2015 Elsevier Interactive Patient Education  2017 [place] Prevention in the Home Falls can cause injuries. They can happen to people of all ages. There are many things you can do to make your home safe and to help prevent falls. What can I do on the outside of my home?  Regularly fix the edges of walkways and driveways and fix any cracks.  Remove anything that might make you trip as you walk through a door, such as a raised step or threshold.  Trim any bushes or trees on the path to your home.  Use bright outdoor lighting.  Clear any walking paths of anything that might make someone trip, such as rocks or tools.  Regularly check to see if handrails are loose or broken. Make sure that both sides of any steps have handrails.  Any raised decks and porches should have guardrails on the edges.  Have any leaves, snow, or ice cleared regularly.  Use sand or salt on walking paths during winter.  Clean up any spills in your garage right away. This includes oil or grease spills. What can I do in the bathroom?  Use night lights.  Install grab bars by the toilet and in the tub and shower. Do not use towel bars as grab bars.  Use non-skid mats or decals in the tub or shower.  If you need to sit down in the shower, use a plastic, non-slip stool.  Keep the floor dry. Clean up any water that spills on the floor as soon as it happens.  Remove soap buildup in the tub or shower regularly.  Attach bath mats  securely with double-sided non-slip rug tape.  Do not have throw rugs and other things on the floor that can make you trip. What can I do in the bedroom?  Use night lights.  Make sure that you have a light by your bed that is easy to reach.  Do not use any sheets or blankets that are too big for your bed. They should not hang down onto the floor.  Have a firm chair that has side arms. You can use this for support while you get dressed.  Do not have throw rugs and other things on the floor that can make you trip. What can I do in the kitchen?  Clean up any spills right away.  Avoid walking on wet floors.  Keep items that you use a lot in easy-to-reach places.  If you need to reach something above you, use a strong step stool that has a grab bar.  Keep electrical cords out of the way.  Do not use floor polish or wax that makes floors slippery. If you must use wax, use non-skid floor wax.  Do not have throw rugs and other  things on the floor that can make you trip. What can I do with my stairs?  Do not leave any items on the stairs.  Make sure that there are handrails on both sides of the stairs and use them. Fix handrails that are broken or loose. Make sure that handrails are as long as the stairways.  Check any carpeting to make sure that it is firmly attached to the stairs. Fix any carpet that is loose or worn.  Avoid having throw rugs at the top or bottom of the stairs. If you do have throw rugs, attach them to the floor with carpet tape.  Make sure that you have a light switch at the top of the stairs and the bottom of the stairs. If you do not have them, ask someone to add them for you. What else can I do to help prevent falls?  Wear shoes that:  Do not have high heels.  Have rubber bottoms.  Are comfortable and fit you well.  Are closed at the toe. Do not wear sandals.  If you use a stepladder:  Make sure that it is fully opened. Do not climb a closed  stepladder.  Make sure that both sides of the stepladder are locked into place.  Ask someone to hold it for you, if possible.  Clearly mark and make sure that you can see:  Any grab bars or handrails.  First and last steps.  Where the edge of each step is.  Use tools that help you move around (mobility aids) if they are needed. These include:  Canes.  Walkers.  Scooters.  Crutches.  Turn on the lights when you go into a dark area. Replace any light bulbs as soon as they burn out.  Set up your furniture so you have a clear path. Avoid moving your furniture around.  If any of your floors are uneven, fix them.  If there are any pets around you, be aware of where they are.  Review your medicines with your doctor. Some medicines can make you feel dizzy. This can increase your chance of falling. Ask your doctor what other things that you can do to help prevent falls. This information is not intended to replace advice given to you by your health care provider. Make sure you discuss any questions you have with your health care provider. Document Released: 03/01/2009 Document Revised: 10/11/2015 Document Reviewed: 06/09/2014 Elsevier Interactive Patient Education  2017 [employer].

## 2019-07-18 NOTE — Assessment & Plan Note (Signed)
Rechecking levels today. Await results. Treat as needed.  

## 2019-07-18 NOTE — Assessment & Plan Note (Signed)
Under good control on current regimen. Continue current regimen. Continue to monitor. Call with any concerns. Refills given. Labs drawn today.   

## 2019-07-18 NOTE — Assessment & Plan Note (Signed)
Will keep BP and cholesterol under good control. Offered referral to cardiology- will hold at this time. Continue to monitor.

## 2019-07-18 NOTE — Progress Notes (Signed)
Subjective:   Zachary Canedy. is a 69 y.o. male who presents for Medicare Annual/Subsequent preventive examination.   Review of Systems:   Cardiac Risk Factors include: advanced age (>59men, >55 women);male gender;hypertension     Objective:    Vitals: BP (!) 149/89 (BP Location: Left Arm, Patient Position: Sitting, Cuff Size: Normal)   Pulse (!) 58   Temp 97.8 F (36.6 C) (Temporal)   Ht 6\' 3"  (1.905 m)   Wt 276 lb (125.2 kg)   SpO2 98%   BMI 34.50 kg/m   Body mass index is 34.5 kg/m.  Advanced Directives 07/18/2019 11/09/2018 07/12/2018  Does Patient Have a Medical Advance Directive? Yes Yes Yes  Type of Advance Directive Living will;Healthcare Power of Blue;Living will;Out of facility DNR (pink MOST or yellow form) Living will;Healthcare Power of [employer] of [place] in Chart? No - copy requested No - copy requested No - copy requested    Tobacco Social History   Tobacco Use  Smoking Status Former Smoker  . Packs/day: 0.00  . Years: 0.00  . Pack years: 0.00  . Quit date: 2000  . Years since quitting: 21.1  Smokeless Tobacco Never Used  Tobacco Comment   social smoker when younger      Counseling given: Not Answered Comment: social smoker when younger    Clinical Intake:  Pre-visit preparation completed: Yes  Pain : No/denies pain     Nutritional Risks: None Diabetes: No  How often do you need to have someone help you when you read instructions, pamphlets, or other written materials from your doctor or pharmacy?: 1 - Never  Interpreter Needed?: No  Information entered by :: Mizael Sagar,LPN  Past Medical History:  Diagnosis Date  . Hypertension   . Nephrolithiasis    Past Surgical History:  Procedure Laterality Date  . CHOLECYSTECTOMY  06/07/1998  . COLONOSCOPY    . COLONOSCOPY WITH PROPOFOL N/A 11/09/2018   Procedure: COLONOSCOPY WITH PROPOFOL;  Surgeon: [name], MD;  Location:  [facility] ENDOSCOPY;  Service: Gastroenterology;  Laterality: N/A;  . SKIN LESION EXCISION     dermatlogy - precancerous lesion 1 year ago on back    Family History  Problem Relation Age of Onset  . Lung cancer Mother   . Hypertension Father   . Diabetes Father   . Prostate cancer Neg Hx   . Bladder Cancer Neg Hx   . Kidney cancer Neg Hx    Social History   Socioeconomic History  . Marital status: Married    Spouse name: Not on file  . Number of children: Not on file  . Years of education: Not on file  . Highest education level: Master's degree (e.g., MA, MS, MEng, MEd, MSW, MBA)  Occupational History  . Occupation: working fulltime   Tobacco Use  . Smoking status: Former Smoker    Packs/day: 0.00    Years: 0.00    Pack years: 0.00    Quit date: 2000    Years since quitting: 21.1  . Smokeless tobacco: Never Used  . Tobacco comment: social smoker when younger   Substance and Sexual Activity  . Alcohol use: Yes    Alcohol/week: 2.0 - 3.0 standard drinks    Types: 2 - 3 Shots of liquor per week  . Drug use: No  . Sexual activity: Yes    Birth control/protection: Surgical  Other Topics Concern  . Not on file  Social History  Theatre stage manager, local friends.    Social Determinants of Health   Financial Resource Strain:   . Difficulty of Paying Living Expenses: Not on file  Food Insecurity:   . Worried About Charity fundraiser in the Last Year: Not on file  . Ran Out of Food in the Last Year: Not on file  Transportation Needs:   . Lack of Transportation (Medical): Not on file  . Lack of Transportation (Non-Medical): Not on file  Physical Activity:   . Days of Exercise per Week: Not on file  . Minutes of Exercise per Session: Not on file  Stress:   . Feeling of Stress : Not on file  Social Connections:   . Frequency of Communication with Friends and Family: Not on file  . Frequency of Social Gatherings with Friends and Family: Not on file  . Attends  Religious Services: Not on file  . Active Member of Clubs or Organizations: Not on file  . Attends Archivist Meetings: Not on file  . Marital Status: Not on file    Outpatient Encounter Medications as of 07/18/2019  Medication Sig  . amLODipine (NORVASC) 5 MG tablet Take 1 tablet (5 mg total) by mouth daily.  . Ascorbic Acid (VITAMIN C) 1000 MG tablet Take 1,000 mg by mouth daily.  . Cholecalciferol (VITAMIN D3) 3000 units TABS Take 1,000 Units by mouth.  . Cyanocobalamin (VITAMIN B 12 PO) Take 3,000 mcg by mouth daily.  . Glucosamine-Chondroit-Vit C-Mn (GLUCOSAMINE 1500 COMPLEX PO) Take 1,500 mg by mouth 2 (two) times daily.  . Methylsulfonylmethane (MSM) 1500 MG TABS Take 1,500 mg by mouth 2 (two) times daily.  . Omega-3 Fatty Acids (FISH OIL) 1200 MG CAPS Take 1,200 mg by mouth 2 (two) times daily.  . Zinc Sulfate (ZINC 15 PO) Take 15 mg by mouth.  . [DISCONTINUED] Multiple Vitamin (MULTIVITAMIN) tablet Take 1 tablet by mouth daily.   No facility-administered encounter medications on file as of 07/18/2019.    Activities of Daily Living In your present state of health, do you have any difficulty performing the following activities: 07/18/2019  Hearing? N  Comment no hearing aids, tinnitus.  Vision? N  Comment eyeglasses, goes to [facility]  Difficulty concentrating or making decisions? Y  Walking or climbing stairs? N  Dressing or bathing? N  Doing errands, shopping? N  Preparing Food and eating ? N  Using the Toilet? N  In the past six months, have you accidently leaked urine? N  Do you have problems with loss of bowel control? N  Managing your Medications? N  Managing your Finances? N  Housekeeping or managing your Housekeeping? N  Some recent data might be hidden    Patient Care Team: Valerie Roys, DO as PCP - General (Family Medicine) Laneta Simmers as Physician Assistant (Urology) Ralene Bathe, MD (Dermatology)   Assessment:   This is a  routine wellness examination for [place].  Exercise Activities and Dietary recommendations Current Exercise Habits: The patient does not participate in regular exercise at present, Exercise limited by: None identified  Goals Addressed            This Visit's Progress   . Exercise 3x per week (30 min per time)       Golf more, and use workout machines at home couple times a week.        Fall Risk: Fall Risk  07/18/2019 12/16/2018 07/12/2018 06/19/2017 06/02/2017  Falls in  the past year? 0 0 1 No No  Comment - Emmi Telephone Survey: data to providers prior to load - - -  Number falls in past yr: 0 - 0 - -  Comment - - November 2019, leg weakened going up the stairs. - -  Injury with Fall? 0 - 0 - -    FALL RISK PREVENTION PERTAINING TO THE HOME:  Any stairs in or around the home? Yes  If so, are there any without handrails? No   Home free of loose throw rugs in walkways, pet beds, electrical cords, etc? Yes  Adequate lighting in your home to reduce risk of falls? Yes   ASSISTIVE DEVICES UTILIZED TO PREVENT FALLS:  Life alert? No  Use of a cane, walker or w/c? No  Grab bars in the bathroom? No  Shower chair or bench in shower? No  Elevated toilet seat or a handicapped toilet? No   TIMED UP AND GO:  Was the test performed? Yes .  Length of time to ambulate 10 feet: 8 sec.   GAIT:  Appearance of gait: Gait steady and fast without the use of an assistive device.  Education: Fall risk prevention has been discussed.  Intervention(s) required? No  DME/home health order needed?  No   Depression Screen PHQ 2/9 Scores 07/18/2019 07/12/2018 06/19/2017 06/02/2017  PHQ - 2 Score 0 0 0 0    Cognitive Function     6CIT Screen 07/18/2019 07/12/2018 06/19/2017  What Year? 0 points 0 points 0 points  What month? 0 points 0 points 0 points  What time? 0 points 0 points 0 points  Count back from 20 0 points 0 points 0 points  Months in reverse 0 points 0 points 0 points  Repeat phrase 4  points 0 points 4 points  Total Score 4 0 4    Immunization History  Administered Date(s) Administered  . Influenza, High Dose Seasonal PF 05/06/2017, 04/07/2018, 03/15/2019  . Influenza-Unspecified 05/06/2017, 03/15/2019  . PFIZER SARS-COV-2 Vaccination 06/11/2019, 07/07/2019  . Pneumococcal Conjugate-13 06/19/2014  . Pneumococcal Polysaccharide-23 06/20/2015  . Tdap 06/19/2014  . Zoster 06/19/2014    Qualifies for Shingles Vaccine? Completed Shingrix in [place].   Tdap: up to date   Flu Vaccine: up to date  Pneumococcal Vaccine: up to date   Covid-19 Vaccine:  Completed series.   Screening Tests Health Maintenance  Topic Date Due  . COLONOSCOPY  11/09/2023  . TETANUS/TDAP  06/19/2024  . INFLUENZA VACCINE  Completed  . Hepatitis C Screening  Completed  . PNA vac Low Risk Adult  Completed   Cancer Screenings:  Colorectal Screening: Completed 11/09/2018. Repeat every 5 years  Lung Cancer Screening: (Low Dose CT Chest recommended if Age 73-80 years, 30 pack-year currently smoking OR have quit w/in 15years.) does not qualify.     Additional Screening:  Hepatitis C Screening: does qualify; Completed 06/02/2017  Vision Screening: Recommended annual ophthalmology exams for early detection of glaucoma and other disorders of the eye. Is the patient up to date with their annual eye exam?  Yes  Who is the provider or what is the name of the office in which the pt attends annual eye exams? [facility]    Dental Screening: Recommended annual dental exams for proper oral hygiene  Community Resource Referral:  CRR required this visit?  No        Plan:  I have personally reviewed and addressed the Medicare Annual Wellness questionnaire and have noted the following in  the patient's chart:  A. Medical and social history B. Use of alcohol, tobacco or illicit drugs  C. Current medications and supplements D. Functional ability and status E.  Nutritional status F.    Physical activity G. Advance directives H. List of other physicians I.  Hospitalizations, surgeries, and ER visits in previous 12 months J.  [address] such as hearing and vision if needed, cognitive and depression L. Referrals and appointments   In addition, I have reviewed and discussed with patient certain preventive protocols, quality metrics, and best practice recommendations. A written personalized care plan for preventive services as well as general preventive health recommendations were provided to patient.   Signed,   Bevelyn Ngo, LPN  [id number] Nurse Health Advisor   Nurse Notes:  Been out of BP meds for a week.

## 2019-07-19 LAB — CBC WITH DIFFERENTIAL/PLATELET
Basophils Absolute: 0.1 10*3/uL (ref 0.0–0.2)
Basos: 1 %
EOS (ABSOLUTE): 0.2 10*3/uL (ref 0.0–0.4)
Eos: 5 %
Hematocrit: 45 % (ref 37.5–51.0)
Hemoglobin: 15.3 g/dL (ref 13.0–17.7)
Immature Grans (Abs): 0 10*3/uL (ref 0.0–0.1)
Immature Granulocytes: 0 %
Lymphocytes Absolute: 1.2 10*3/uL (ref 0.7–3.1)
Lymphs: 27 %
MCH: 30.3 pg (ref 26.6–33.0)
MCHC: 34 g/dL (ref 31.5–35.7)
MCV: 89 fL (ref 79–97)
Monocytes Absolute: 0.4 10*3/uL (ref 0.1–0.9)
Monocytes: 9 %
Neutrophils Absolute: 2.6 10*3/uL (ref 1.4–7.0)
Neutrophils: 58 %
Platelets: 174 10*3/uL (ref 150–450)
RBC: 5.05 x10E6/uL (ref 4.14–5.80)
RDW: 13.6 % (ref 11.6–15.4)
WBC: 4.6 10*3/uL (ref 3.4–10.8)

## 2019-07-19 LAB — COMPREHENSIVE METABOLIC PANEL
ALT: 39 IU/L (ref 0–44)
AST: 25 IU/L (ref 0–40)
Albumin/Globulin Ratio: 2 (ref 1.2–2.2)
Albumin: 4.6 g/dL (ref 3.8–4.8)
Alkaline Phosphatase: 90 IU/L (ref 39–117)
BUN/Creatinine Ratio: 17 (ref 10–24)
BUN: 23 mg/dL (ref 8–27)
Bilirubin Total: 1.5 mg/dL — ABNORMAL HIGH (ref 0.0–1.2)
CO2: 19 mmol/L — ABNORMAL LOW (ref 20–29)
Calcium: 9.3 mg/dL (ref 8.6–10.2)
Chloride: 107 mmol/L — ABNORMAL HIGH (ref 96–106)
Creatinine, Ser: 1.35 mg/dL — ABNORMAL HIGH (ref 0.76–1.27)
GFR calc Af Amer: 61 mL/min/{1.73_m2} (ref >59)
GFR calc non Af Amer: 53 mL/min/{1.73_m2} — ABNORMAL LOW (ref >59)
Globulin, Total: 2.3 g/dL (ref 1.5–4.5)
Glucose: 113 mg/dL — ABNORMAL HIGH (ref 65–99)
Potassium: 4.2 mmol/L (ref 3.5–5.2)
Sodium: 141 mmol/L (ref 134–144)
Total Protein: 6.9 g/dL (ref 6.0–8.5)

## 2019-07-19 LAB — LIPID PANEL W/O CHOL/HDL RATIO
Cholesterol, Total: 233 mg/dL — ABNORMAL HIGH (ref 100–199)
HDL: 66 mg/dL
LDL Chol Calc (NIH): 149 mg/dL — ABNORMAL HIGH (ref 0–99)
Triglycerides: 104 mg/dL (ref 0–149)
VLDL Cholesterol Cal: 18 mg/dL (ref 5–40)

## 2019-07-19 LAB — PSA: Prostate Specific Ag, Serum: 2.6 ng/mL (ref 0.0–4.0)

## 2019-07-19 LAB — TSH: TSH: 1.53 u[IU]/mL (ref 0.450–4.500)

## 2019-07-21 ENCOUNTER — Other Ambulatory Visit: Payer: Self-pay | Admitting: Family Medicine

## 2019-07-21 DIAGNOSIS — N289 Disorder of kidney and ureter, unspecified: Secondary | ICD-10-CM

## 2019-07-26 ENCOUNTER — Other Ambulatory Visit: Payer: Self-pay | Admitting: Radiology

## 2019-07-26 DIAGNOSIS — N200 Calculus of kidney: Secondary | ICD-10-CM

## 2019-08-04 ENCOUNTER — Other Ambulatory Visit: Payer: Self-pay | Admitting: Family Medicine

## 2019-08-04 ENCOUNTER — Other Ambulatory Visit: Payer: Self-pay

## 2019-08-04 MED ORDER — AMLODIPINE BESYLATE 5 MG PO TABS: 5.0000 mg | ORAL_TABLET | Freq: Every day | ORAL | 1 refills | Status: DC

## 2019-08-04 NOTE — Telephone Encounter (Signed)
Has not had follow up yet. Just started on that medicine. 90 day supply not appropriate,

## 2019-08-04 NOTE — Telephone Encounter (Signed)
New Rx request for [facility] and 90 day supply request for Amlodipine 5mg . LOV 07/18/2019

## 2019-08-04 NOTE — Telephone Encounter (Signed)
Called and spoke with patient. Message relayed. Patient stated that he has been taking the amlodipine for years now. He states he prefers a 90 days supply. Please advise

## 2019-08-05 ENCOUNTER — Telehealth: Payer: Self-pay | Admitting: Family Medicine

## 2019-08-05 DIAGNOSIS — R918 Other nonspecific abnormal finding of lung field: Secondary | ICD-10-CM

## 2019-08-05 NOTE — Telephone Encounter (Signed)
Please let him know that he's due for follow up on the nodules in his lungs in early April- I've put in the order for his CT scan and they will be calling him

## 2019-08-05 NOTE — Telephone Encounter (Signed)
Patient notified

## 2019-08-05 NOTE — Telephone Encounter (Signed)
-----   Message from Valerie Roys, DO sent at 08/05/2018  1:26 PM EDT ----- Needs recheck on CT chest on 08/21/19

## 2019-08-08 ENCOUNTER — Other Ambulatory Visit: Payer: Self-pay

## 2019-08-08 ENCOUNTER — Other Ambulatory Visit: Payer: Medicare Other

## 2019-08-08 DIAGNOSIS — N289 Disorder of kidney and ureter, unspecified: Secondary | ICD-10-CM | POA: Diagnosis not present

## 2019-08-09 LAB — BASIC METABOLIC PANEL
BUN/Creatinine Ratio: 16 (ref 10–24)
BUN: 18 mg/dL (ref 8–27)
CO2: 24 mmol/L (ref 20–29)
Calcium: 9.2 mg/dL (ref 8.6–10.2)
Chloride: 105 mmol/L (ref 96–106)
Creatinine, Ser: 1.11 mg/dL (ref 0.76–1.27)
GFR calc Af Amer: 77 mL/min/{1.73_m2} (ref >59)
GFR calc non Af Amer: 66 mL/min/{1.73_m2} (ref >59)
Glucose: 101 mg/dL — ABNORMAL HIGH (ref 65–99)
Potassium: 3.9 mmol/L (ref 3.5–5.2)
Sodium: 143 mmol/L (ref 134–144)

## 2019-08-16 ENCOUNTER — Ambulatory Visit: Payer: Medicare Other

## 2019-08-16 ENCOUNTER — Ambulatory Visit: Payer: Medicare Other | Admitting: Urology

## 2019-08-22 ENCOUNTER — Ambulatory Visit
Admission: RE | Admit: 2019-08-22 | Discharge: 2019-08-22 | Disposition: A | Discharge status: Home / Self Care | Payer: Medicare Other | Source: Ambulatory Visit | Attending: Family Medicine | Admitting: Family Medicine

## 2019-08-22 ENCOUNTER — Ambulatory Visit
Admission: RE | Admit: 2019-08-22 | Discharge: 2019-08-22 | Disposition: A | Discharge status: Home / Self Care | Payer: Medicare Other | Source: Ambulatory Visit | Attending: Urology | Admitting: Urology

## 2019-08-22 ENCOUNTER — Other Ambulatory Visit: Payer: Self-pay

## 2019-08-22 DIAGNOSIS — N200 Calculus of kidney: Secondary | ICD-10-CM

## 2019-08-22 DIAGNOSIS — R918 Other nonspecific abnormal finding of lung field: Secondary | ICD-10-CM | POA: Insufficient documentation

## 2019-08-22 DIAGNOSIS — N202 Calculus of kidney with calculus of ureter: Secondary | ICD-10-CM | POA: Diagnosis not present

## 2019-08-23 ENCOUNTER — Ambulatory Visit (INDEPENDENT_AMBULATORY_CARE_PROVIDER_SITE_OTHER): Payer: Medicare Other | Admitting: Urology

## 2019-08-23 ENCOUNTER — Other Ambulatory Visit: Payer: Self-pay | Admitting: Family Medicine

## 2019-08-23 ENCOUNTER — Encounter: Payer: Self-pay | Admitting: Urology

## 2019-08-23 VITALS — BP 125/81 | HR 59 | Ht 75.0 in | Wt 270.0 lb

## 2019-08-23 DIAGNOSIS — I2510 Atherosclerotic heart disease of native coronary artery without angina pectoris: Secondary | ICD-10-CM | POA: Diagnosis not present

## 2019-08-23 DIAGNOSIS — N200 Calculus of kidney: Secondary | ICD-10-CM

## 2019-08-23 NOTE — Progress Notes (Signed)
08/23/2019 9:45 AM   [name] 1950-06-24 IT:[id number]  Referring provider: Valerie Roys, DO [place],  [place] [address]  Chief Complaint  Patient presents with  . Nephrolithiasis    HPI: Patient is a 69 year old male with nephrolithiasis who presents today as a yearly follow up.    CT Renal stone study on 08/20/2017 noted mild right hydronephrosis and hydroureter, secondary to a 5 mm stone at the right UVJ.   Additional stones present within the right left kidneys.  His UA was positive for TNTC RBC's.  WBC count was 7.9.  Serum creatinine was 1.43.  He subsequently passed that stone.    KUB 08/26/2017 the right UVJ stone is no longer visible.   Very small punctate stone is located in the lower pole of each kidney.    RUS on 09/08/2017 revealed interval clearing of previously noted right-sided hydronephrosis.    KUB on 08/03/2018 revealed punctate right midpole nephrolithiasis.  KUB 08/22/2019 a 2-3 mm calculus projects over the expected vicinity of the mid right ureter and peers to have migrated distally compared to prior radiographs. Tiny additional bilateral renal calculi.  UA today is yellow clear, trace ketone, specific gravity 1.030, pH 5.5, 0-5 WBCs, 0-10 epithelial cells and some hyaline casts present.   PMH: Past Medical History:  Diagnosis Date  . Hypertension   . Mixed hyperlipidemia 07/18/2019  . Nephrolithiasis     Surgical History: Past Surgical History:  Procedure Laterality Date  . CHOLECYSTECTOMY  06/07/1998  . COLONOSCOPY    . COLONOSCOPY WITH PROPOFOL N/A 11/09/2018   Procedure: COLONOSCOPY WITH PROPOFOL;  Surgeon: [name], MD;  Location: [facility] ENDOSCOPY;  Service: Gastroenterology;  Laterality: N/A;  . SKIN LESION EXCISION     dermatlogy - precancerous lesion 1 year ago on back     Home Medications:  Allergies as of 08/23/2019   No Known Allergies     Medication List       Accurate as of August 23, 2019 11:59 PM. If you have  any questions, ask your nurse or doctor.        amLODipine 5 MG tablet Commonly known as: NORVASC Take 1 tablet (5 mg total) by mouth daily.   Fish Oil 1200 MG Caps Take 1,200 mg by mouth 2 (two) times daily.   GLUCOSAMINE 1500 COMPLEX PO Take 1,500 mg by mouth 2 (two) times daily.   MSM 1500 MG Tabs Take 1,500 mg by mouth 2 (two) times daily.   VITAMIN B 12 PO Take 3,000 mcg by mouth daily.   vitamin C 1000 MG tablet Take 1,000 mg by mouth daily.   Vitamin D3 75 MCG (3000 UT) Tabs Take 1,000 Units by mouth.   ZINC 15 PO Take 15 mg by mouth.       Allergies: No Known Allergies  Family History: Family History  Problem Relation Age of Onset  . Lung cancer Mother   . Hypertension Father   . Diabetes Father   . Prostate cancer Neg Hx   . Bladder Cancer Neg Hx   . Kidney cancer Neg Hx     Social History:  reports that he quit smoking about 21 years ago. He smoked 0.00 packs per day for 0.00 years. He has never used smokeless tobacco. He reports current alcohol use of about 2.0 - 3.0 standard drinks of alcohol per week. He reports that he does not use drugs.  ROS: For pertinent review of systems please  refer to history of present illness  Physical Exam: BP 125/81   Pulse (!) 59   Ht 6\' 3"  (1.905 m)   Wt 270 lb (122.5 kg)   BMI 33.75 kg/m   Constitutional:  Well nourished. Alert and oriented, No acute distress. HEENT: [place] AT, mask in place.  Trachea midline Cardiovascular: No clubbing, cyanosis, or edema. Respiratory: Normal respiratory effort, no increased work of breathing. GI: Abdomen is soft, non tender, non distended, no abdominal masses. Liver and spleen not palpable.  No hernias appreciated.  Stool sample for occult testing is not indicated.   GU: No CVA tenderness.  No bladder fullness or masses.  Patient with uncircumcised phallus. Foreskin easily retracted  Urethral meatus is patent.  No penile discharge. No penile lesions or rashes. Scrotum without  lesions, cysts, rashes and/or edema.  Testicles are located scrotally bilaterally. No masses are appreciated in the testicles. Left and right epididymis are normal. Rectal: Patient with  normal sphincter tone. Anus and perineum without scarring or rashes. No rectal masses are appreciated. Prostate is approximately 60 + grams, could only palpate the apex and midportion of the gland, no nodules are appreciated. Seminal vesicles could not be palpated Skin: No rashes, bruises or suspicious lesions. Lymph: No inguinal adenopathy. Neurologic: Grossly intact, no focal deficits, moving all 4 extremities. Psychiatric: Normal mood and affect.  Laboratory Data: Component     Latest Ref Rng & Units 06/02/2017 07/12/2018 07/18/2019  Prostate Specific Ag, Serum     0.0 - 4.0 ng/mL 2.7 1.8 2.6    Lab Results  Component Value Date   WBC 4.6 07/18/2019   HGB 15.3 07/18/2019   HCT 45.0 07/18/2019   MCV 89 07/18/2019   PLT 174 07/18/2019    Lab Results  Component Value Date   CREATININE 1.11 08/08/2019   Lab Results  Component Value Date   TSH 1.530 07/18/2019      Component Value Date/Time   CHOL 233 (H) 07/18/2019 1434   HDL 66 07/18/2019 1434   LDLCALC 149 (H) 07/18/2019 1434   Lab Results  Component Value Date   AST 25 07/18/2019   Lab Results  Component Value Date   ALT 39 07/18/2019    Urinalysis Component     Latest Ref Rng & Units 08/23/2019  Specific Gravity, UA     1.005 - 1.030 >1.030 (H)  pH, UA     5.0 - 7.5 5.5  Color, UA     Yellow Yellow  Appearance Ur     Clear Clear  Leukocytes,UA     Negative Negative  Protein,UA     Negative/Trace Negative  Glucose, UA     Negative Negative  Ketones, UA     Negative Trace (A)  RBC, UA     Negative Negative  Bilirubin, UA     Negative Negative  Urobilinogen, Ur     0.2 - 1.0 mg/dL 0.2  Nitrite, UA     Negative Negative  Microscopic Examination      See below:   Component     Latest Ref Rng & Units 08/23/2019  WBC,  UA     0 - 5 /hpf 0-5  RBC     0 - 2 /hpf None seen  Epithelial Cells (non renal)     0 - 10 /hpf 0-10  Casts     None seen /lpf Present (A)  Cast Type     N/A Hyaline casts  Mucus, UA  Not Estab. Present (A)  Bacteria, UA     None seen/Few Few   I have reviewed the labs.   Pertinent Imaging: CLINICAL DATA:  Follow-up right-sided kidney stones  EXAM: ABDOMEN - 1 VIEW  COMPARISON:  08/03/2018  FINDINGS: The bowel gas pattern is normal. A 2-3 mm calculus projects over the expected vicinity of the mid right ureter and peers to have migrated distally compared to prior radiographs, at which time it projected over the right kidney. Tiny additional bilateral renal calculi. No radio-opaque calculi or other significant radiographic abnormality are seen.  IMPRESSION: A 2-3 mm calculus projects over the expected vicinity of the mid right ureter and peers to have migrated distally compared to prior radiographs. Tiny additional bilateral renal calculi.   Electronically Signed   By: Eddie Candle M.D.   On: 08/22/2019 16:13 I have independently reviewed the films and appreciate the new calculus.  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   CLINICAL DATA:  Kidney stone follow-up.  EXAM: CT ABDOMEN AND PELVIS WITHOUT CONTRAST  TECHNIQUE: Multidetector CT imaging of the abdomen and pelvis was performed following the standard protocol without IV contrast.  COMPARISON:  08/20/2017  FINDINGS: Lower chest: Several tiny nodules in the lung bases bilaterally are stable in the 2 year interval, consistent with benign etiology.  Hepatobiliary: No focal abnormality in the liver on this study without intravenous contrast. Gallbladder surgically absent. No intrahepatic or extrahepatic biliary dilation.  Pancreas: No focal mass lesion. No dilatation of the main duct. No intraparenchymal cyst. No peripancreatic edema.  Spleen: No  splenomegaly. No focal mass lesion.  Adrenals/Urinary Tract: No adrenal nodule or mass.  A single 1-2 mm nonobstructing stone is identified in the lower pole right kidney. 4 tiny nonobstructing stones are seen in the left kidney, best appreciated on coronal imaging (images 104, 89, 88, and 85/series 4).  No ureteral or bladder stones. No secondary changes in either kidney or ureter.  Stomach/Bowel: Stomach is unremarkable. No gastric wall thickening. No evidence of outlet obstruction. Duodenum is normally positioned as is the ligament of Treitz. No small bowel wall thickening. No small bowel dilatation. The terminal ileum is normal. The appendix is normal. No gross colonic mass. No colonic wall thickening. Diverticular changes are noted in the left colon without evidence of diverticulitis.  Vascular/Lymphatic: There is abdominal aortic atherosclerosis without aneurysm. There is no gastrohepatic or hepatoduodenal ligament lymphadenopathy. No retroperitoneal or mesenteric lymphadenopathy. No pelvic sidewall lymphadenopathy.  Reproductive: The prostate gland and seminal vesicles are unremarkable.  Other: No intraperitoneal free fluid.  Musculoskeletal: Small left groin hernia contains only fat. No worrisome lytic or sclerotic osseous abnormality.  IMPRESSION: 1. Tiny bilateral nonobstructing renal stones. No ureteral or bladder stones. No secondary changes in either kidney or ureter. 2. Tiny basilar nodules in both lower lobes, stable in the 2 year interval since prior study consistent with benign etiology. 3.  Aortic Atherosclerois (ICD10-170.0) 4. Small fat containing left groin hernia.   Electronically Signed   By: Misty Stanley M.D.   On: 09/06/2019 14:27   I have independently reviewed the films and the calcifications are likely Randall plaques and should not cause issues.     Assessment & Plan:    1. 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  These notes generated with voice recognition software. I apologize for typographical errors.  Zara Council, PA-C  [facility] [address] [place], [place] [address] [phone]

## 2019-08-24 LAB — URINALYSIS, COMPLETE
Bilirubin, UA: NEGATIVE
Glucose, UA: NEGATIVE
Leukocytes,UA: NEGATIVE
Nitrite, UA: NEGATIVE
Protein,UA: NEGATIVE
RBC, UA: NEGATIVE
Specific Gravity, UA: >1.03 — ABNORMAL HIGH (ref 1.005–1.030)
Urobilinogen, Ur: 0.2 mg/dL (ref 0.2–1.0)
pH, UA: 5.5 (ref 5.0–7.5)

## 2019-08-24 LAB — MICROSCOPIC EXAMINATION: RBC, Urine: NONE SEEN /hpf (ref 0–2)

## 2019-09-06 ENCOUNTER — Ambulatory Visit
Admission: RE | Admit: 2019-09-06 | Discharge: 2019-09-06 | Disposition: A | Discharge status: Home / Self Care | Payer: Medicare Other | Source: Ambulatory Visit | Attending: Urology | Admitting: Urology

## 2019-09-06 ENCOUNTER — Other Ambulatory Visit: Payer: Self-pay

## 2019-09-06 DIAGNOSIS — N200 Calculus of kidney: Secondary | ICD-10-CM | POA: Diagnosis not present

## 2019-09-13 IMAGING — CR ABDOMEN - 1 VIEW
1 series · 2 of 2 positions shown · non-contrast
Comparison: CT 08/20/2017

CLINICAL DATA: Bilateral nephrolithiasis

EXAM:
ABDOMEN - 1 VIEW

[Series 1: dg abd 1 view · 0.14mm/px · 2 of 2 slices shown]
[im 1/2]
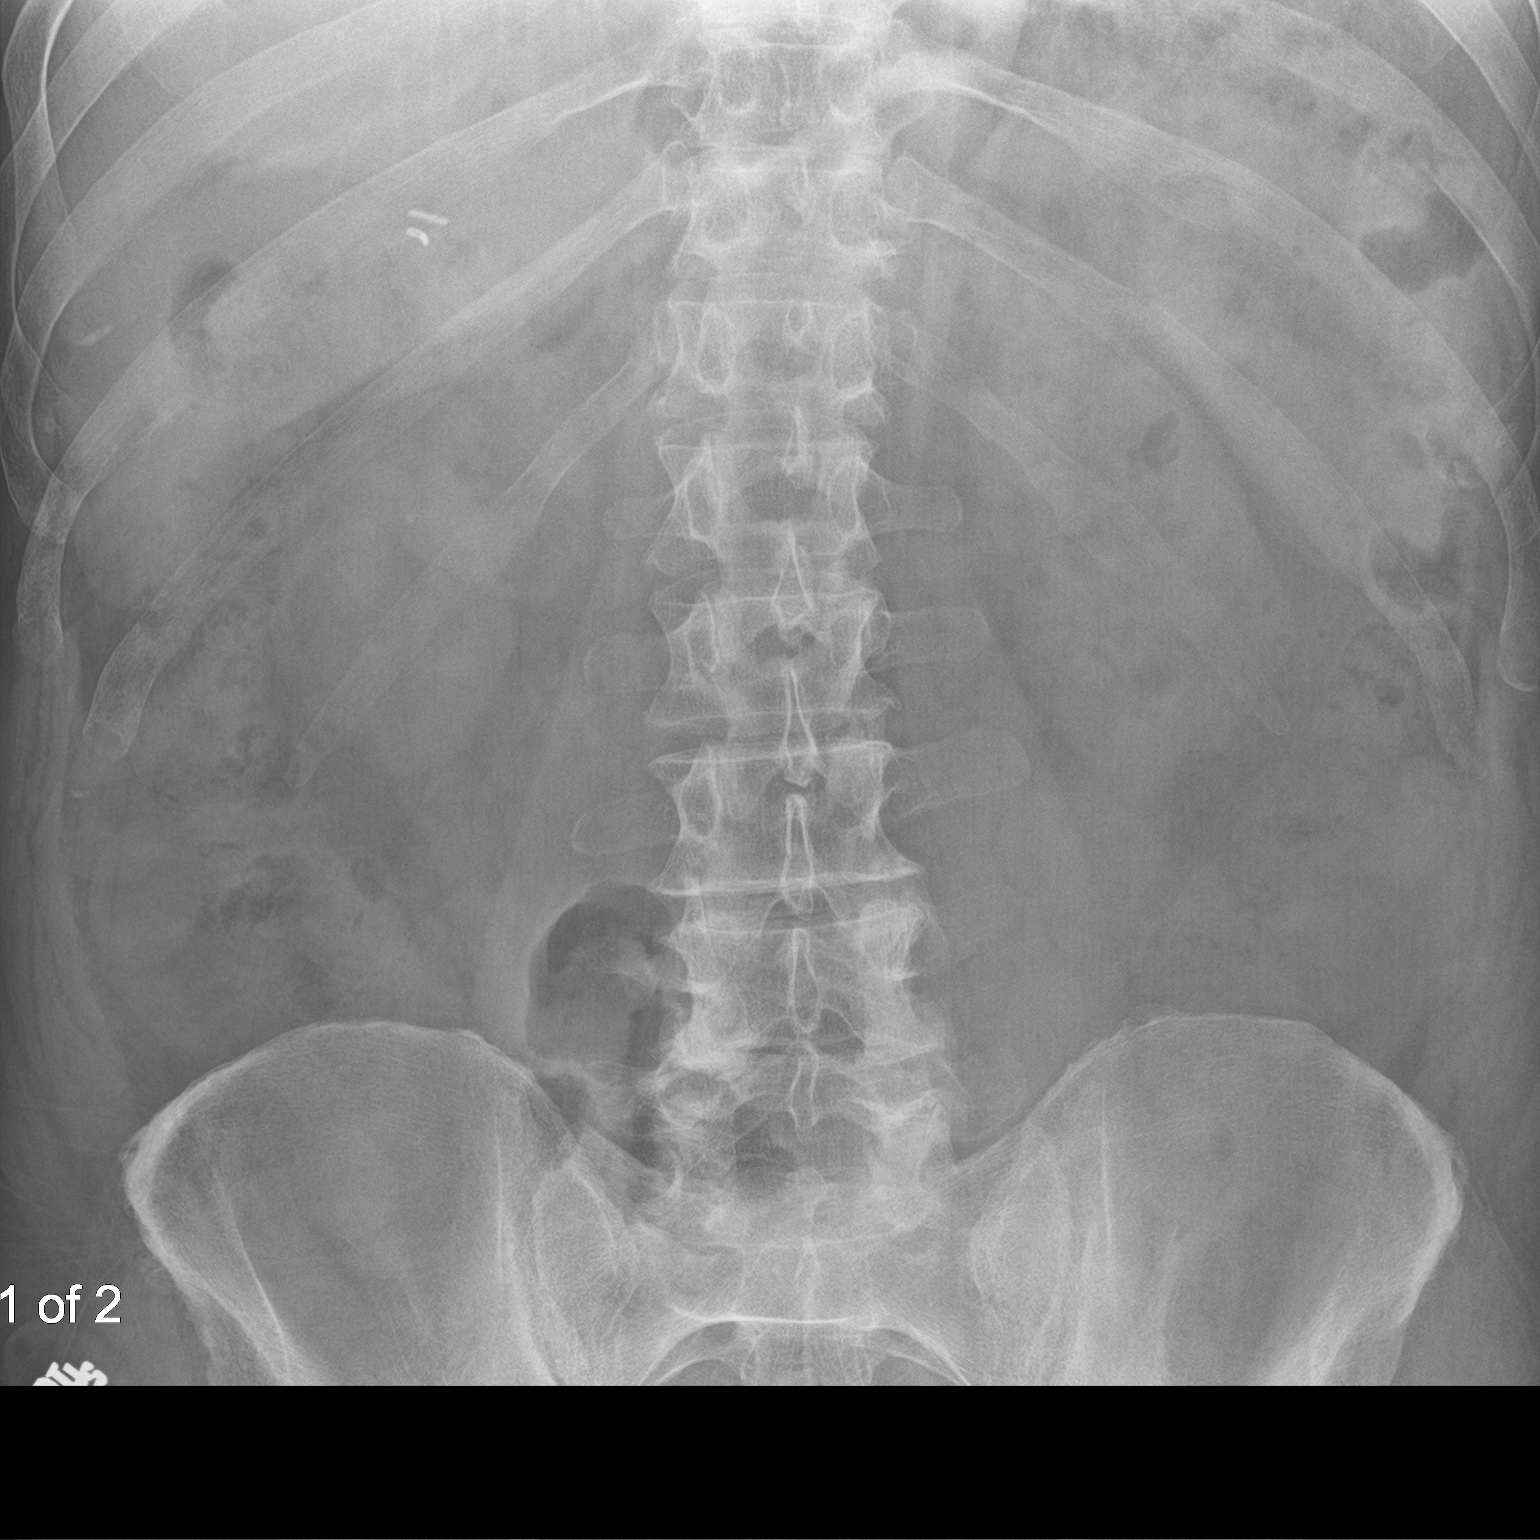
[im 2/2]
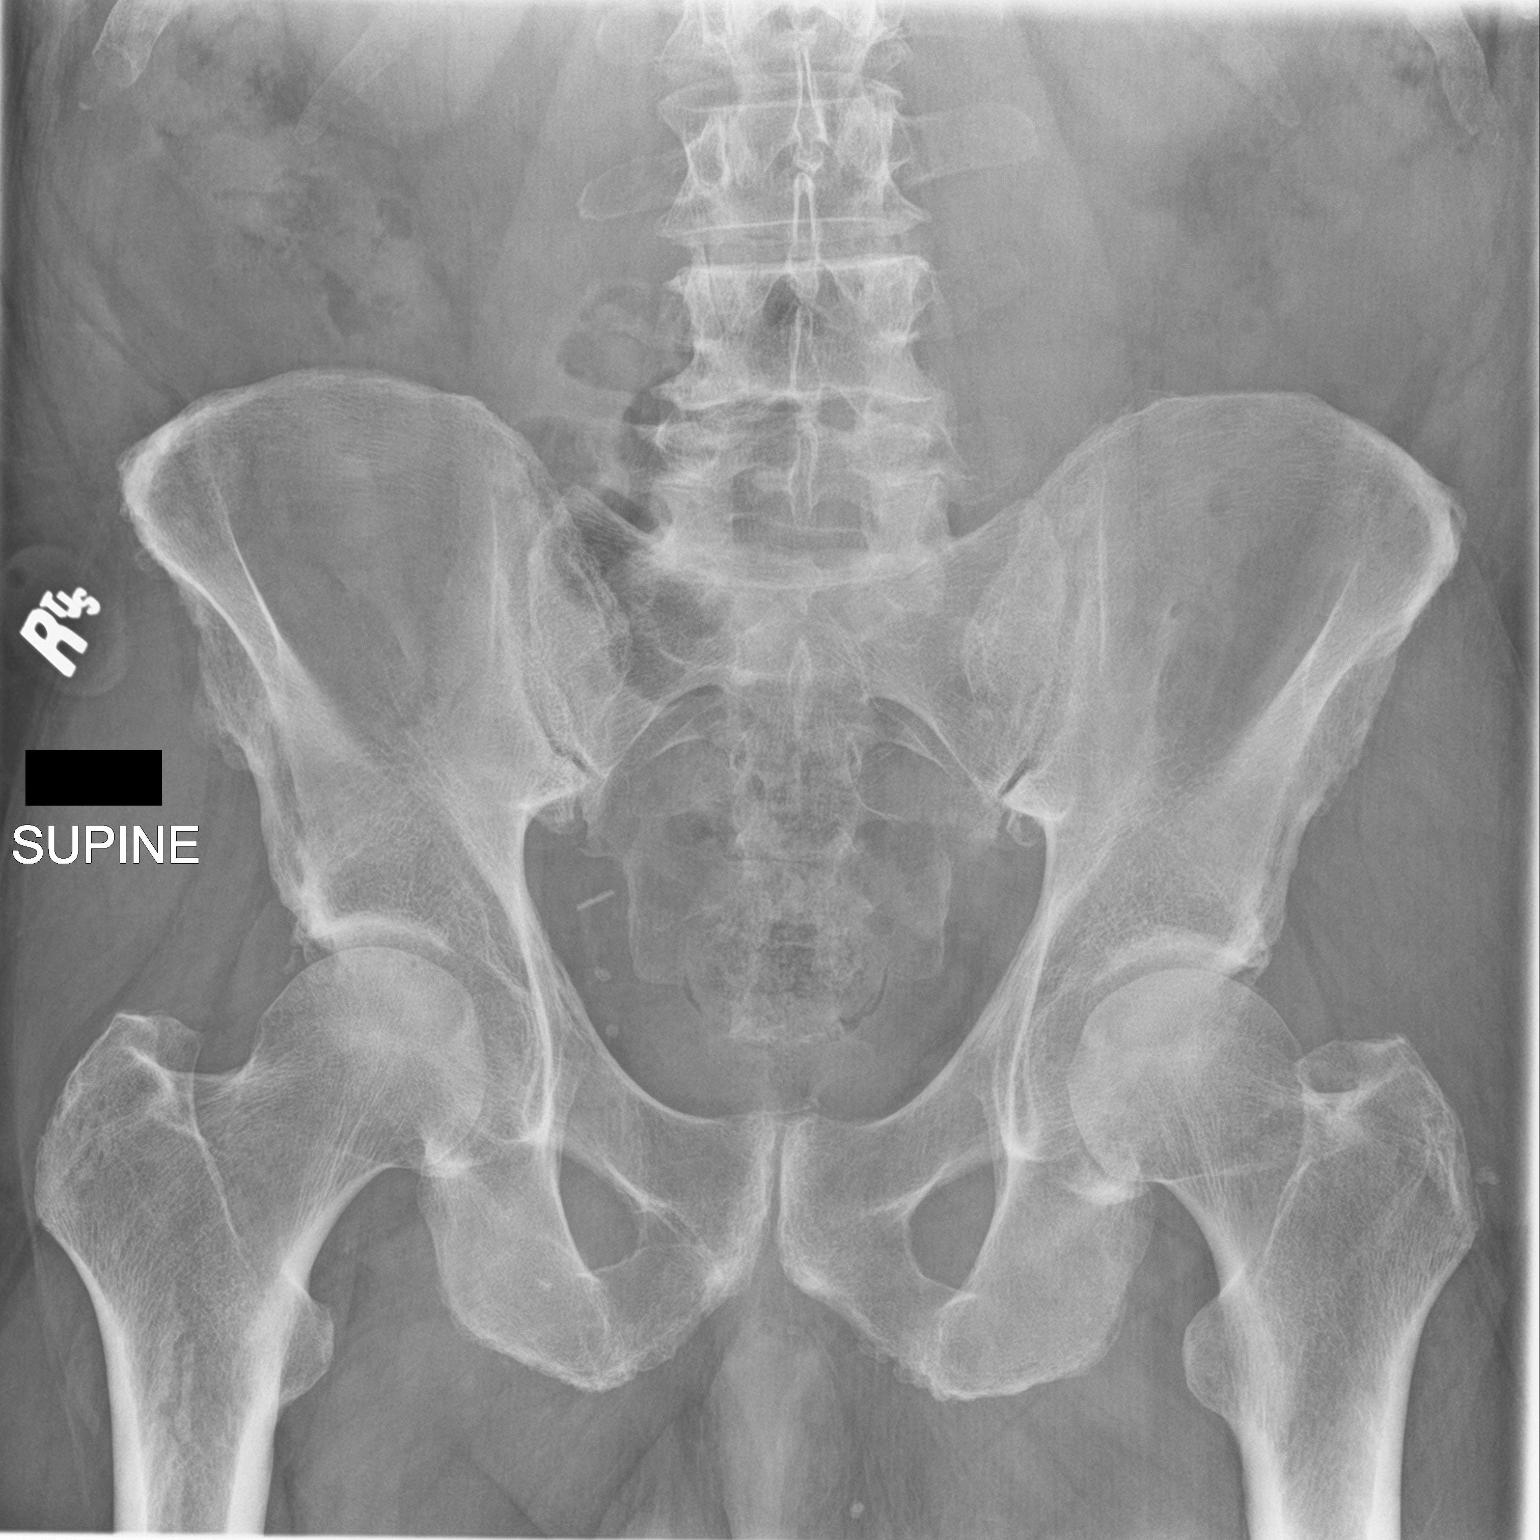

[2 of 2 positions shown; findings below may reference images not displayed]

FINDINGS: Punctate stone projects over the midpole of the right kidney. No
definite visible stones within the left kidney or expected course of
the ureters. Nonobstructive bowel gas pattern. Prior
cholecystectomy.
IMPRESSION: Punctate right midpole nephrolithiasis.

## 2020-01-06 IMAGING — US US RENAL
1 series · 14 of 25 positions shown · non-contrast
Comparison: 08/27/2017 plain film exam.  08/20/2017 CT.

CLINICAL DATA: 67-year-old male with right ureteral stone.
Subsequent encounter.

EXAM:
RENAL / URINARY TRACT ULTRASOUND COMPLETE

[Series 1: us renal · 0.26mm/px · 14 of 41 slices shown]
[im 1/41]
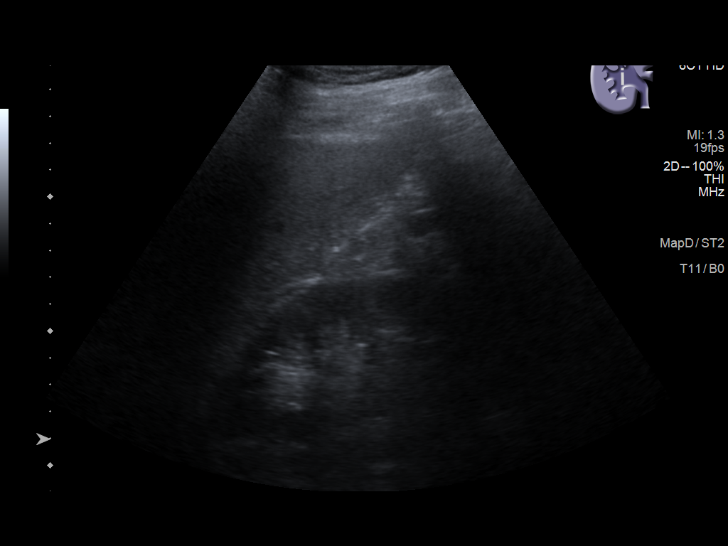
[im 4/41]
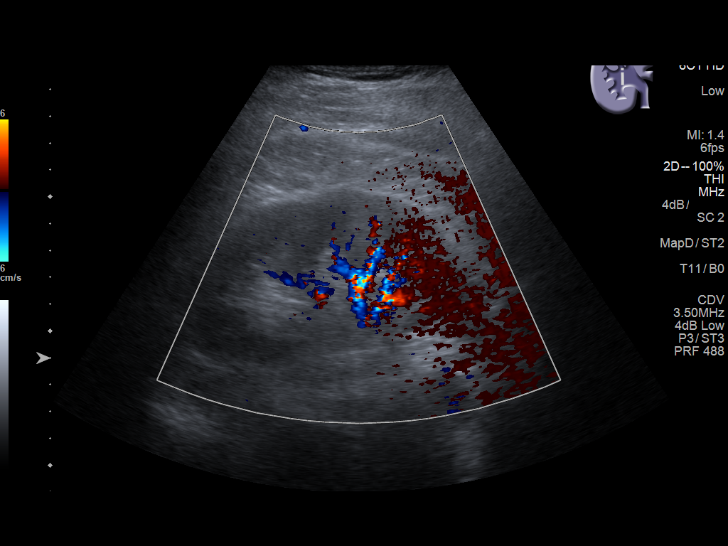
[im 7/41]
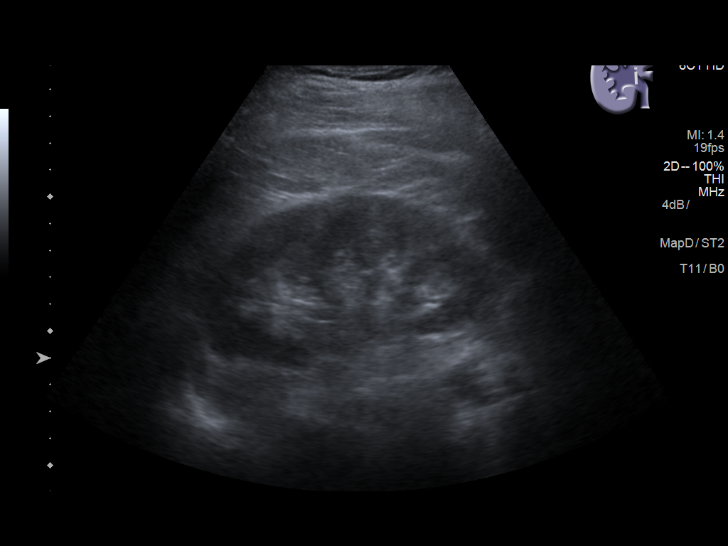
[im 11/41]
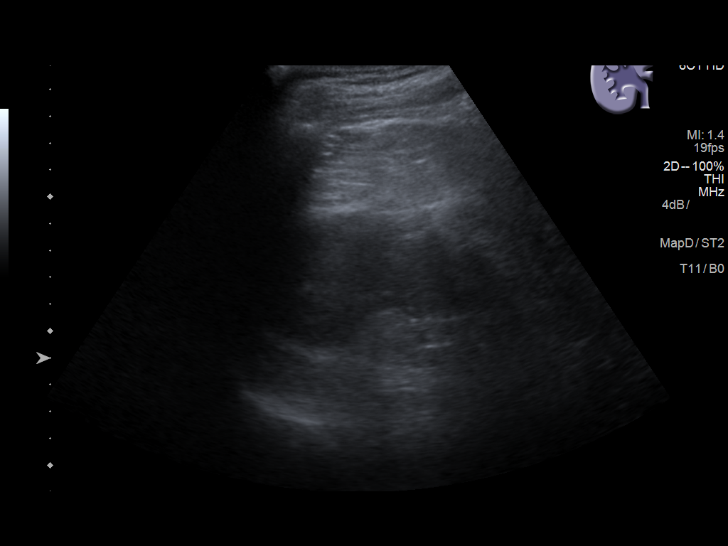
[im 14/41]
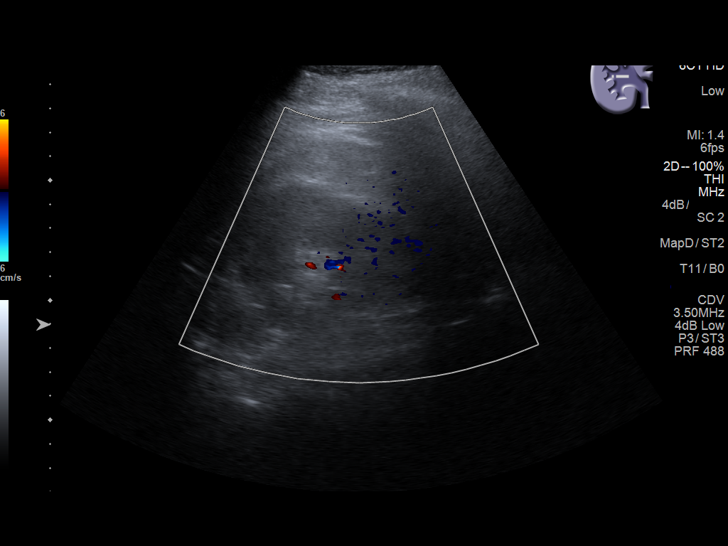
[im 16/41]
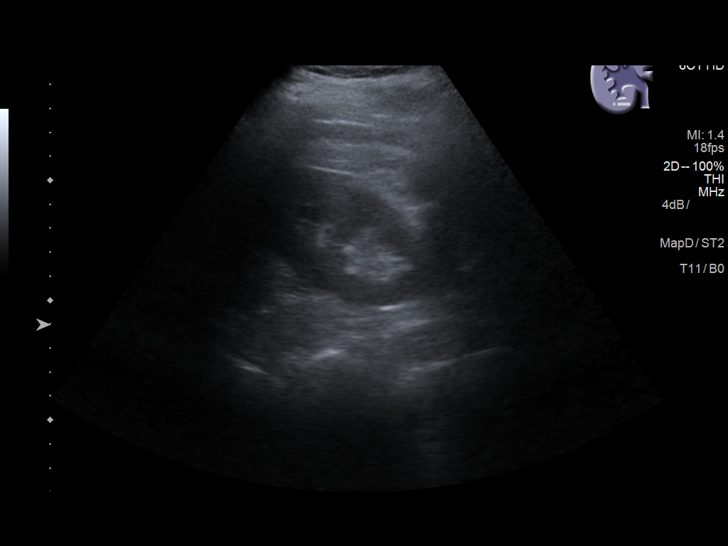
[im 19/41]
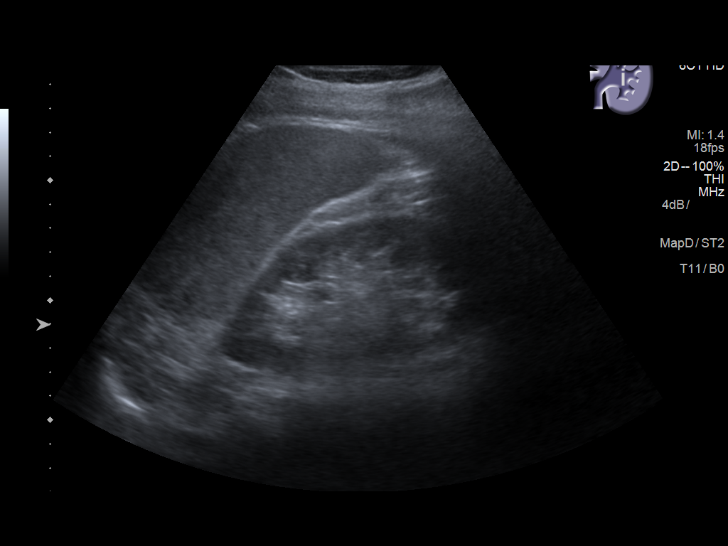
[im 22/41]
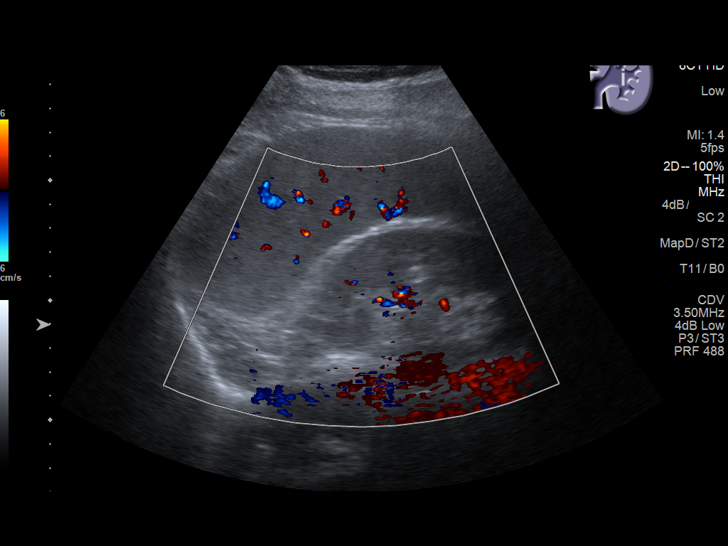
[im 26/41]
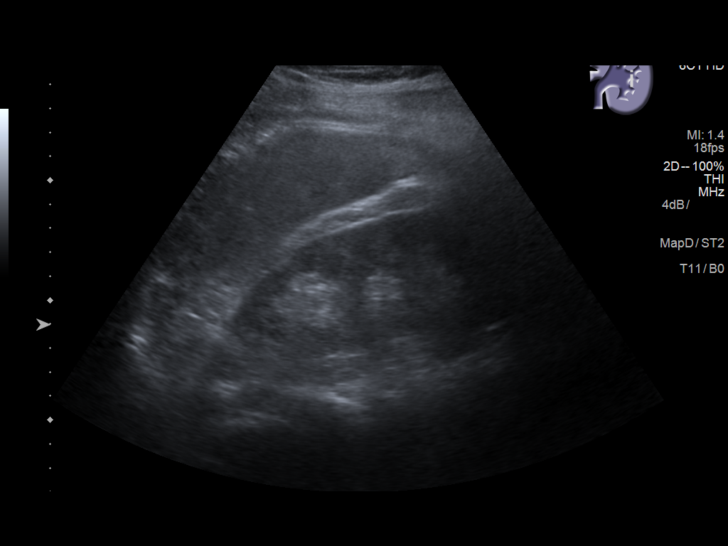
[im 27/41]
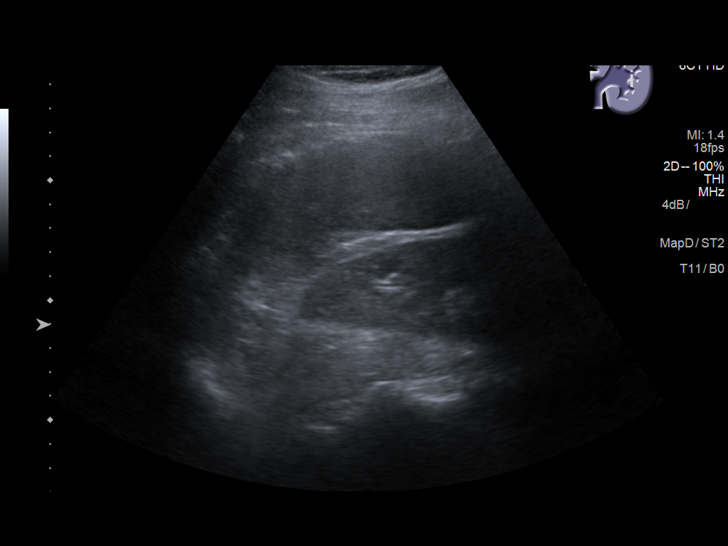
[im 31/41]
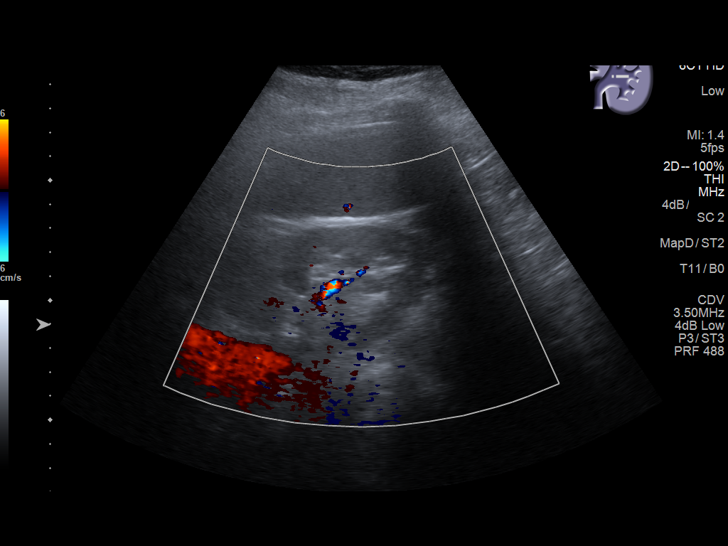
[im 34/41]
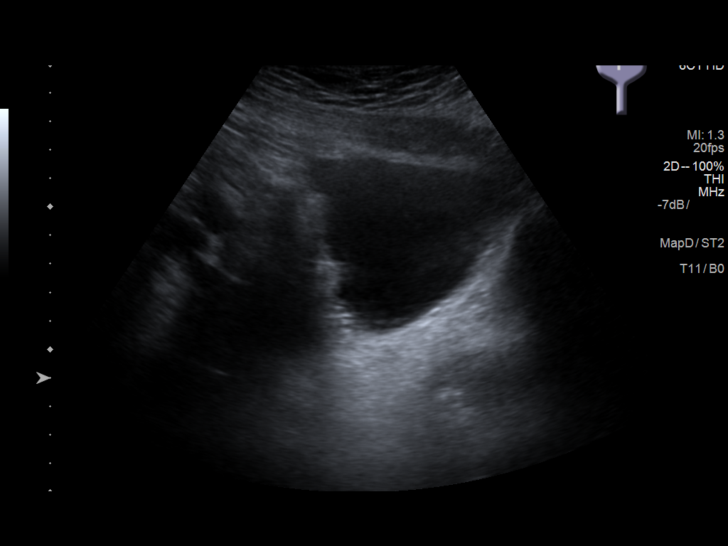
[im 37/41]
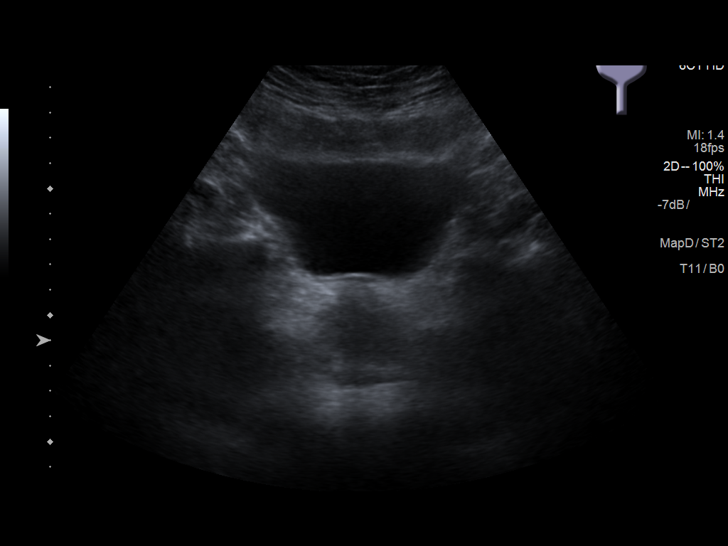
[im 41/41]
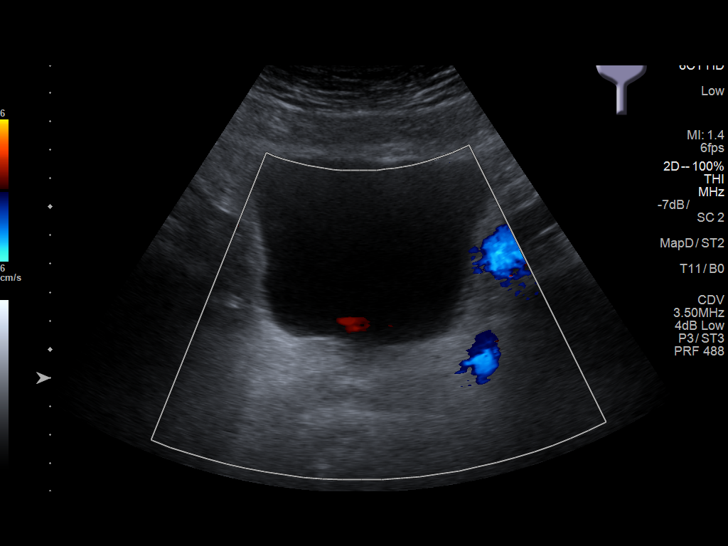

[14 of 25 positions shown; findings below may reference images not displayed]

FINDINGS: Right Kidney:

Length: 11.4 cm. Echogenicity within normal limits. No mass or
hydronephrosis visualized.

Left Kidney:

Length: 12.2 cm. Echogenicity within normal limits. No mass or
hydronephrosis visualized.

Bladder:

Appears normal for degree of bladder distention. Both ureteral jets
noted.
IMPRESSION: Interval clearing of previously noted right-sided hydronephrosis.

## 2020-01-30 ENCOUNTER — Ambulatory Visit: Payer: Medicare Other | Admitting: Family Medicine

## 2020-02-13 ENCOUNTER — Other Ambulatory Visit: Payer: Self-pay

## 2020-02-13 ENCOUNTER — Encounter: Payer: Self-pay | Admitting: Family Medicine

## 2020-02-13 ENCOUNTER — Ambulatory Visit (INDEPENDENT_AMBULATORY_CARE_PROVIDER_SITE_OTHER): Payer: Medicare Other | Admitting: Family Medicine

## 2020-02-13 VITALS — BP 134/72 | HR 56 | Temp 98.1°F | Wt 271.0 lb

## 2020-02-13 DIAGNOSIS — I2510 Atherosclerotic heart disease of native coronary artery without angina pectoris: Secondary | ICD-10-CM | POA: Diagnosis not present

## 2020-02-13 DIAGNOSIS — I10 Essential (primary) hypertension: Secondary | ICD-10-CM

## 2020-02-13 DIAGNOSIS — Z23 Encounter for immunization: Secondary | ICD-10-CM

## 2020-02-13 DIAGNOSIS — E782 Mixed hyperlipidemia: Secondary | ICD-10-CM

## 2020-02-13 MED ORDER — AMLODIPINE BESYLATE 5 MG PO TABS: 5.0000 mg | ORAL_TABLET | Freq: Every day | ORAL | 1 refills | Status: DC

## 2020-02-13 NOTE — Assessment & Plan Note (Addendum)
Rechecking levels today. Await results. Treat as needed.  

## 2020-02-13 NOTE — Assessment & Plan Note (Signed)
Under good control on current regimen. Continue current regimen. Continue to monitor. Call with any concerns. Refills given. Labs drawn today.   

## 2020-02-13 NOTE — Progress Notes (Signed)
BP 134/72   Pulse (!) 56   Temp 98.1 F (36.7 C) (Oral)   Wt 271 lb (122.9 kg)   SpO2 95%   BMI 33.87 kg/m    Subjective:    Patient ID: Zachary Rogers., male    DOB: Oct 23, 1950, 69 y.o.   MRN: [id number]  HPI: Zachary Rogers is a 69 y.o. male  Chief Complaint  Patient presents with  . Hypertension  . Hyperlipidemia   HYPERTENSION / HYPERLIPIDEMIA Satisfied with current treatment? yes Duration of hypertension: chronic BP monitoring frequency: not checking BP medication side effects: no Past BP meds: amlodipine Duration of hyperlipidemia: chronic Cholesterol medication side effects: not on anything Cholesterol supplements: fish oil Past cholesterol medications: none Medication compliance: excellent compliance Aspirin: yes Recent stressors: no Recurrent headaches: no Visual changes: no Palpitations: no Dyspnea: no Chest pain: no Lower extremity edema: no Dizzy/lightheaded: no  Relevant past medical, surgical, family and social history reviewed and updated as indicated. Interim medical history since our last visit reviewed. Allergies and medications reviewed and updated.  Review of Systems  Constitutional: Negative.   Respiratory: Negative.   Cardiovascular: Negative.   Gastrointestinal: Positive for diarrhea. Negative for abdominal distention, abdominal pain, anal bleeding, blood in stool, constipation, nausea, rectal pain and vomiting.  Musculoskeletal: Negative.   Psychiatric/Behavioral: Negative.     Per HPI unless specifically indicated above     Objective:    BP 134/72   Pulse (!) 56   Temp 98.1 F (36.7 C) (Oral)   Wt 271 lb (122.9 kg)   SpO2 95%   BMI 33.87 kg/m   Wt Readings from Last 3 Encounters:  02/13/20 271 lb (122.9 kg)  08/23/19 270 lb (122.5 kg)  07/18/19 276 lb (125.2 kg)    Physical Exam Vitals and nursing note reviewed.  Constitutional:      General: He is not in acute distress.    Appearance: Normal appearance. He is  not ill-appearing, toxic-appearing or diaphoretic.  HENT:     Head: Normocephalic and atraumatic.     Right Ear: External ear normal.     Left Ear: External ear normal.     Nose: Nose normal.     Mouth/Throat:     Mouth: Mucous membranes are moist.     Pharynx: Oropharynx is clear.  Eyes:     General: No scleral icterus.       Right eye: No discharge.        Left eye: No discharge.     Extraocular Movements: Extraocular movements intact.     Conjunctiva/sclera: Conjunctivae normal.     Pupils: Pupils are equal, round, and reactive to light.  Cardiovascular:     Rate and Rhythm: Normal rate and regular rhythm.     Pulses: Normal pulses.     Heart sounds: Normal heart sounds. No murmur heard.  No friction rub. No gallop.   Pulmonary:     Effort: Pulmonary effort is normal. No respiratory distress.     Breath sounds: Normal breath sounds. No stridor. No wheezing, rhonchi or rales.  Chest:     Chest wall: No tenderness.  Musculoskeletal:        General: Normal range of motion.     Cervical back: Normal range of motion and neck supple.  Skin:    General: Skin is warm and dry.     Capillary Refill: Capillary refill takes less than 2 seconds.     Coloration: Skin is not jaundiced or  pale.     Findings: No bruising, erythema, lesion or rash.  Neurological:     General: No focal deficit present.     Mental Status: He is alert and oriented to person, place, and time. Mental status is at baseline.  Psychiatric:        Mood and Affect: Mood normal.        Behavior: Behavior normal.        Thought Content: Thought content normal.        Judgment: Judgment normal.     Results for orders placed or performed in visit on 08/23/19  Microscopic Examination   URINE  Result Value Ref Range   WBC, UA 0-5 0 - 5 /hpf   RBC None seen 0 - 2 /hpf   Epithelial Cells (non renal) 0-10 0 - 10 /hpf   Casts Present (A) None seen /lpf   Cast Type Hyaline casts N/A   Mucus, UA Present (A) Not  Estab.   Bacteria, UA Few None seen/Few  Urinalysis, Complete  Result Value Ref Range   Specific Gravity, UA >1.030 (H) 1.005 - 1.030   pH, UA 5.5 5.0 - 7.5   Color, UA Yellow Yellow   Appearance Ur Clear Clear   Leukocytes,UA Negative Negative   Protein,UA Negative Negative/Trace   Glucose, UA Negative Negative   Ketones, UA Trace (A) Negative   RBC, UA Negative Negative   Bilirubin, UA Negative Negative   Urobilinogen, Ur 0.2 0.2 - 1.0 mg/dL   Nitrite, UA Negative Negative   Microscopic Examination See below:       Assessment & Plan:   Problem List Items Addressed This Visit      Cardiovascular and Mediastinum   Hypertension - Primary    Under good control on current regimen. Continue current regimen. Continue to monitor. Call with any concerns. Refills given. Labs drawn today.        Relevant Medications   amLODipine (NORVASC) 5 MG tablet   Other Relevant Orders   Comprehensive metabolic panel     Other   Mixed hyperlipidemia    Rechecking levels today. Await results. Treat as needed.       Relevant Medications   amLODipine (NORVASC) 5 MG tablet   Other Relevant Orders   Comprehensive metabolic panel   Lipid Panel w/o Chol/HDL Ratio    Other Visit Diagnoses    Flu vaccine need       Flu shot given today.   Relevant Orders   Flu Vaccine QUAD High Dose(Fluad) (Completed)       Follow up plan: Return in about 6 months (around 08/12/2020).

## 2020-02-14 LAB — LIPID PANEL W/O CHOL/HDL RATIO
Cholesterol, Total: 246 mg/dL — ABNORMAL HIGH (ref 100–199)
HDL: 62 mg/dL (ref >39)
LDL Chol Calc (NIH): 157 mg/dL — ABNORMAL HIGH (ref 0–99)
Triglycerides: 149 mg/dL (ref 0–149)
VLDL Cholesterol Cal: 27 mg/dL (ref 5–40)

## 2020-02-14 LAB — COMPREHENSIVE METABOLIC PANEL
ALT: 39 IU/L (ref 0–44)
AST: 25 IU/L (ref 0–40)
Albumin/Globulin Ratio: 2 (ref 1.2–2.2)
Albumin: 4.7 g/dL (ref 3.7–4.7)
Alkaline Phosphatase: 86 IU/L (ref 44–121)
BUN/Creatinine Ratio: 13 (ref 10–24)
BUN: 16 mg/dL (ref 8–27)
Bilirubin Total: 1.7 mg/dL — ABNORMAL HIGH (ref 0.0–1.2)
CO2: 22 mmol/L (ref 20–29)
Calcium: 9.3 mg/dL (ref 8.6–10.2)
Chloride: 104 mmol/L (ref 96–106)
Creatinine, Ser: 1.24 mg/dL (ref 0.76–1.27)
GFR calc Af Amer: 67 mL/min/{1.73_m2} (ref >59)
GFR calc non Af Amer: 58 mL/min/{1.73_m2} — ABNORMAL LOW (ref >59)
Globulin, Total: 2.4 g/dL (ref 1.5–4.5)
Glucose: 99 mg/dL (ref 65–99)
Potassium: 3.9 mmol/L (ref 3.5–5.2)
Sodium: 140 mmol/L (ref 134–144)
Total Protein: 7.1 g/dL (ref 6.0–8.5)

## 2020-02-27 DIAGNOSIS — Z23 Encounter for immunization: Secondary | ICD-10-CM | POA: Diagnosis not present

## 2020-07-06 DIAGNOSIS — Z20822 Contact with and (suspected) exposure to covid-19: Secondary | ICD-10-CM | POA: Diagnosis not present

## 2020-07-18 ENCOUNTER — Telehealth: Payer: Self-pay | Admitting: Family Medicine

## 2020-07-18 NOTE — Telephone Encounter (Signed)
Copied from [place] [phone]. Topic: Medicare AWV >> Jul 18, 2020 10:47 AM Cher Nakai R wrote: Reason for CRM:   Left message to notify that AWVS scheduled 07/23/20 will be completed by phone not in the office -srs

## 2020-07-23 ENCOUNTER — Ambulatory Visit (INDEPENDENT_AMBULATORY_CARE_PROVIDER_SITE_OTHER): Payer: Medicare Other

## 2020-07-23 VITALS — Ht 75.0 in | Wt 257.0 lb

## 2020-07-23 DIAGNOSIS — Z0000 Encounter for general adult medical examination without abnormal findings: Secondary | ICD-10-CM

## 2020-07-23 NOTE — Progress Notes (Signed)
I connected with Zachary Rogers today by telephone and verified that I am speaking with the correct person using two identifiers. Location patient: home Location provider: work Persons participating in the virtual visit: [name], [name].   I discussed the limitations, risks, security and privacy concerns of performing an evaluation and management service by telephone and the availability of in person appointments. I also discussed with the patient that there may be a patient responsible charge related to this service. The patient expressed understanding and verbally consented to this telephonic visit.    Interactive audio and video telecommunications were attempted between this provider and patient, however failed, due to patient having technical difficulties OR patient did not have access to video capability.  We continued and completed visit with audio only.     Vital signs may be patient reported or missing.  Subjective:   Zachary Rogers. is a 70 y.o. male who presents for Medicare Annual/Subsequent preventive examination.  Review of Systems     Cardiac Risk Factors include: advanced age (>62men, >65 women);dyslipidemia;hypertension;male gender;obesity (BMI >30kg/m2)     Objective:    Today's Vitals   07/23/20 0856  Weight: 257 lb (116.6 kg)  Height: 6\' 3"  (1.905 m)   Body mass index is 32.12 kg/m.  Advanced Directives 07/23/2020 07/18/2019 11/09/2018 07/12/2018  Does Patient Have a Medical Advance Directive? Yes Yes Yes Yes  Type of Paramedic of [place];Living will Living will;Healthcare Power of [place];Living will;Out of facility DNR (pink MOST or yellow form) Living will;Healthcare Power of [employer] of [place] in Chart? No - copy requested No - copy requested No - copy requested No - copy requested    Current Medications (verified) Outpatient Encounter Medications as of  07/23/2020  Medication Sig  . amLODipine (NORVASC) 5 MG tablet Take 1 tablet (5 mg total) by mouth daily.  . Ascorbic Acid (VITAMIN C) 1000 MG tablet Take 1,000 mg by mouth daily.  . Cholecalciferol (VITAMIN D3) 3000 units TABS Take 1,000 Units by mouth.  . Cyanocobalamin (VITAMIN B 12 PO) Take 3,000 mcg by mouth daily.  . Glucosamine-Chondroit-Vit C-Mn (GLUCOSAMINE 1500 COMPLEX PO) Take 1,500 mg by mouth 2 (two) times daily.  . Methylsulfonylmethane (MSM) 1500 MG TABS Take 1,500 mg by mouth 2 (two) times daily.  . Omega-3 Fatty Acids (FISH OIL) 1200 MG CAPS Take 1,200 mg by mouth 2 (two) times daily.  . Zinc Sulfate (ZINC 15 PO) Take 15 mg by mouth.   No facility-administered encounter medications on file as of 07/23/2020.    Allergies (verified) Patient has no known allergies.   History: Past Medical History:  Diagnosis Date  . Hypertension   . Mixed hyperlipidemia 07/18/2019  . Nephrolithiasis    Past Surgical History:  Procedure Laterality Date  . CHOLECYSTECTOMY  06/07/1998  . COLONOSCOPY    . COLONOSCOPY WITH PROPOFOL N/A 11/09/2018   Procedure: COLONOSCOPY WITH PROPOFOL;  Surgeon: [name], MD;  Location: [facility] ENDOSCOPY;  Service: Gastroenterology;  Laterality: N/A;  . SKIN LESION EXCISION     dermatlogy - precancerous lesion 1 year ago on back    Family History  Problem Relation Age of Onset  . Lung cancer Mother   . Hypertension Father   . Diabetes Father   . Prostate cancer Neg Hx   . Bladder Cancer Neg Hx   . Kidney cancer Neg Hx    Social History   Socioeconomic  History  . Marital status: Married    Spouse name: Not on file  . Number of children: Not on file  . Years of education: Not on file  . Highest education level: Master's degree (e.g., MA, MS, MEng, MEd, MSW, MBA)  Occupational History  . Occupation: working fulltime   Tobacco Use  . Smoking status: Former Smoker    Packs/day: 0.00    Years: 0.00    Pack years: 0.00    Quit date: 2000    Years  since quitting: 22.1  . Smokeless tobacco: Never Used  . Tobacco comment: social smoker when younger   Vaping Use  . Vaping Use: Never used  Substance and Sexual Activity  . Alcohol use: Yes    Alcohol/week: 2.0 - 3.0 standard drinks    Types: 2 - 3 Shots of liquor per week  . Drug use: No  . Sexual activity: Yes    Birth control/protection: Surgical  Other Topics Concern  . Not on file  Social History Theatre stage manager, local friends.    Social Determinants of Health   Financial Resource Strain: Low Risk   . Difficulty of Paying Living Expenses: Not hard at all  Food Insecurity: No Food Insecurity  . Worried About Charity fundraiser in the Last Year: Never true  . Ran Out of Food in the Last Year: Never true  Transportation Needs: No Transportation Needs  . Lack of Transportation (Medical): No  . Lack of Transportation (Non-Medical): No  Physical Activity: Sufficiently Active  . Days of Exercise per Week: 3 days  . Minutes of Exercise per Session: 60 min  Stress: No Stress Concern Present  . Feeling of Stress : Not at all  Social Connections: Not on file    Tobacco Counseling Counseling given: Not Answered Comment: social smoker when younger    Clinical Intake:  Pre-visit preparation completed: Yes  Pain : No/denies pain     Nutritional Status: BMI > 30  Obese Nutritional Risks: Nausea/ vomitting/ diarrhea (diarrhea due to diet, resolving) Diabetes: No  How often do you need to have someone help you when you read instructions, pamphlets, or other written materials from your doctor or pharmacy?: 1 - Never What is the last grade level you completed in school?: masters degree  Diabetic? no  Interpreter Needed?: No  Information entered by :: NAllen LPN   Activities of Daily Living In your present state of health, do you have any difficulty performing the following activities: 07/23/2020  Hearing? N  Comment has tinnitus  Vision? N   Difficulty concentrating or making decisions? N  Walking or climbing stairs? N  Dressing or bathing? N  Doing errands, shopping? N  Preparing Food and eating ? N  Using the Toilet? N  In the past six months, have you accidently leaked urine? N  Do you have problems with loss of bowel control? N  Managing your Medications? N  Managing your Finances? N  Housekeeping or managing your Housekeeping? N  Some recent data might be hidden    Patient Care Team: Valerie Roys, DO as PCP - General (Family Medicine) Laneta Simmers as Physician Assistant (Urology) Ralene Bathe, MD (Dermatology)  Indicate any recent Medical Services you may have received from other than [facility] providers in the past year (date may be approximate).     Assessment:   This is a routine wellness examination for [place].  Hearing/Vision screen  Hearing Screening  125Hz  250Hz  500Hz  1000Hz  2000Hz  3000Hz  4000Hz  6000Hz  8000Hz   Right ear:           Left ear:           Vision Screening Comments: Regular eye exams,   Dietary issues and exercise activities discussed: Current Exercise Habits: Home exercise routine, Type of exercise: walking, Time (Minutes): 60, Frequency (Times/Week): 3, Weekly Exercise (Minutes/Week): 180  Goals    .  Exercise 3x per week (30 min per time) (pt-stated)      Walk every day for approx 11min.     Marland Kitchen  Exercise 3x per week (30 min per time)      Golf more, and use workout machines at home couple times a week.     .  Patient Stated      07/23/2020, wants to weigh between 240-250 pounds      Depression Screen PHQ 2/9 Scores 07/23/2020 07/18/2019 07/12/2018 06/19/2017 06/02/2017 06/09/2016  PHQ - 2 Score 0 0 0 0 0 0    Fall Risk Fall Risk  07/23/2020 07/18/2019 12/16/2018 07/12/2018 06/19/2017  Falls in the past year? 0 0 0 1 No  Comment - - Emmi Telephone Survey: data to providers prior to load - -  Number falls in past yr: - 0 - 0 -  Comment - - - November 2019, leg weakened going  up the stairs. -  Injury with Fall? - 0 - 0 -  Risk for fall due to : Medication side effect - - - -  Follow up Falls evaluation completed;Education provided;Falls prevention discussed - - - -    FALL RISK PREVENTION PERTAINING TO THE HOME:  Any stairs in or around the home? Yes  If so, are there any without handrails? No  Home free of loose throw rugs in walkways, pet beds, electrical cords, etc? Yes  Adequate lighting in your home to reduce risk of falls? Yes   ASSISTIVE DEVICES UTILIZED TO PREVENT FALLS:  Life alert? No  Use of a cane, walker or w/c? No  Grab bars in the bathroom? No  Shower chair or bench in shower? No  Elevated toilet seat or a handicapped toilet? Yes   TIMED UP AND GO:  Was the test performed? No .    Cognitive Function:     6CIT Screen 07/23/2020 07/18/2019 07/12/2018 06/19/2017  What Year? 0 points 0 points 0 points 0 points  What month? 0 points 0 points 0 points 0 points  What time? 0 points 0 points 0 points 0 points  Count back from 20 0 points 0 points 0 points 0 points  Months in reverse 0 points 0 points 0 points 0 points  Repeat phrase 2 points 4 points 0 points 4 points  Total Score 2 4 0 4    Immunizations Immunization History  Administered Date(s) Administered  . Fluad Quad(high Dose 65+) 02/13/2020  . Influenza, High Dose Seasonal PF 05/06/2017, 04/07/2018, 03/15/2019  . Influenza-Unspecified 05/06/2017, 03/15/2019  . PFIZER(Purple Top)SARS-COV-2 Vaccination 06/11/2019, 07/07/2019, 02/27/2020  . Pneumococcal Conjugate-13 06/19/2014  . Pneumococcal Polysaccharide-23 06/20/2015  . Tdap 06/19/2014  . Zoster 06/19/2014    TDAP status: Up to date  Flu Vaccine status: Up to date  Pneumococcal vaccine status: Up to date  Covid-19 vaccine status: Completed vaccines  Qualifies for Shingles Vaccine? Yes   Zostavax completed Yes   Shingrix Completed?: No.    Education has been provided regarding the importance of this vaccine. Patient  has been advised to call  insurance company to determine out of pocket expense if they have not yet received this vaccine. Advised may also receive vaccine at local pharmacy or Health Dept. Verbalized acceptance and understanding.  Screening Tests Health Maintenance  Topic Date Due  . COLONOSCOPY (Pts 45-54yrs Insurance coverage will need to be confirmed)  11/09/2023  . TETANUS/TDAP  06/19/2024  . INFLUENZA VACCINE  Completed  . COVID-19 Vaccine  Completed  . Hepatitis C Screening  Completed  . PNA vac Low Risk Adult  Completed  . HPV VACCINES  Aged Out    Health Maintenance  There are no preventive care reminders to display for this patient.  Colorectal cancer screening: Type of screening: Colonoscopy. Completed 11/09/2018. Repeat every 5 years  Lung Cancer Screening: (Low Dose CT Chest recommended if Age 32-80 years, 30 pack-year currently smoking OR have quit w/in 15years.) does not qualify.   Lung Cancer Screening Referral: no  Additional Screening:  Hepatitis C Screening: does qualify; Completed 06/02/2017  Vision Screening: Recommended annual ophthalmology exams for early detection of glaucoma and other disorders of the eye. Is the patient up to date with their annual eye exam?  No  Who is the provider or what is the name of the office in which the patient attends annual eye exams? Didn't remember name If pt is not established with a provider, would they like to be referred to a provider to establish care? No .   Dental Screening: Recommended annual dental exams for proper oral hygiene  Community Resource Referral / Chronic Care Management: CRR required this visit?  No   CCM required this visit?  No      Plan:     I have personally reviewed and noted the following in the patient's chart:   . Medical and social history . Use of alcohol, tobacco or illicit drugs  . Current medications and supplements . Functional ability and status . Nutritional status . Physical  activity . Advanced directives . List of other physicians . Hospitalizations, surgeries, and ER visits in previous 12 months . Vitals . Screenings to include cognitive, depression, and falls . Referrals and appointments  In addition, I have reviewed and discussed with patient certain preventive protocols, quality metrics, and best practice recommendations. A written personalized care plan for preventive services as well as general preventive health recommendations were provided to patient.     Kellie Simmering, LPN   08/24/1857   Nurse Notes:

## 2020-07-23 NOTE — Patient Instructions (Signed)
[name] , Thank you for taking time to come for your Medicare Wellness Visit. I appreciate your ongoing commitment to your health goals. Please review the following plan we discussed and let me know if I can assist you in the future.   Screening recommendations/referrals: Colonoscopy: completed 11/09/2018, due 11/09/2023 Recommended yearly ophthalmology/optometry visit for glaucoma screening and checkup Recommended yearly dental visit for hygiene and checkup  Vaccinations: Influenza vaccine: completed 02/13/2020, due 12/17/2020 Pneumococcal vaccine: completed 06/20/2015 Tdap vaccine: completed 06/19/2014, due 06/19/2024 Shingles vaccine: discussed   Covid-19:  02/27/2020, 07/07/2019, 06/11/2019  Advanced directives: Please bring a copy of your POA (Power of Attorney) and/or Living Will to your next appointment.   Conditions/risks identified: none  Next appointment: Follow up in one year for your annual wellness visit.   Preventive Care 72 Years and Older, Male Preventive care refers to lifestyle choices and visits with your health care provider that can promote health and wellness. What does preventive care include?  A yearly physical exam. This is also called an annual well check.  Dental exams once or twice a year.  Routine eye exams. Ask your health care provider how often you should have your eyes checked.  Personal lifestyle choices, including:  Daily care of your teeth and gums.  Regular physical activity.  Eating a healthy diet.  Avoiding tobacco and drug use.  Limiting alcohol use.  Practicing safe sex.  Taking low doses of aspirin every day.  Taking vitamin and mineral supplements as recommended by your health care provider. What happens during an annual well check? The services and screenings done by your health care provider during your annual well check will depend on your age, overall health, lifestyle risk factors, and family history of disease. Counseling  Your  health care provider may ask you questions about your:  Alcohol use.  Tobacco use.  Drug use.  Emotional well-being.  Home and relationship well-being.  Sexual activity.  Eating habits.  History of falls.  Memory and ability to understand (cognition).  Work and work Statistician. Screening  You may have the following tests or measurements:  Height, weight, and BMI.  Blood pressure.  Lipid and cholesterol levels. These may be checked every 5 years, or more frequently if you are over 72 years old.  Skin check.  Lung cancer screening. You may have this screening every year starting at age 72 if you have a 30-pack-year history of smoking and currently smoke or have quit within the past 15 years.  Fecal occult blood test (FOBT) of the stool. You may have this test every year starting at age 72.  Flexible sigmoidoscopy or colonoscopy. You may have a sigmoidoscopy every 5 years or a colonoscopy every 10 years starting at age 72.  Prostate cancer screening. Recommendations will vary depending on your family history and other risks.  Hepatitis C blood test.  Hepatitis B blood test.  Sexually transmitted disease (STD) testing.  Diabetes screening. This is done by checking your blood sugar (glucose) after you have not eaten for a while (fasting). You may have this done every 1-3 years.  Abdominal aortic aneurysm (AAA) screening. You may need this if you are a current or former smoker.  Osteoporosis. You may be screened starting at age 24 if you are at high risk. Talk with your health care provider about your test results, treatment options, and if necessary, the need for more tests. Vaccines  Your health care provider may recommend certain vaccines, such as:  Influenza  vaccine. This is recommended every year.  Tetanus, diphtheria, and acellular pertussis (Tdap, Td) vaccine. You may need a Td booster every 10 years.  Zoster vaccine. You may need this after age  72.  Pneumococcal 13-valent conjugate (PCV13) vaccine. One dose is recommended after age 72.  Pneumococcal polysaccharide (PPSV23) vaccine. One dose is recommended after age 72. Talk to your health care provider about which screenings and vaccines you need and how often you need them. This information is not intended to replace advice given to you by your health care provider. Make sure you discuss any questions you have with your health care provider. Document Released: 06/01/2015 Document Revised: 01/23/2016 Document Reviewed: 03/06/2015 Elsevier Interactive Patient Education  2017 [place] Prevention in the Home Falls can cause injuries. They can happen to people of all ages. There are many things you can do to make your home safe and to help prevent falls. What can I do on the outside of my home?  Regularly fix the edges of walkways and driveways and fix any cracks.  Remove anything that might make you trip as you walk through a door, such as a raised step or threshold.  Trim any bushes or trees on the path to your home.  Use bright outdoor lighting.  Clear any walking paths of anything that might make someone trip, such as rocks or tools.  Regularly check to see if handrails are loose or broken. Make sure that both sides of any steps have handrails.  Any raised decks and porches should have guardrails on the edges.  Have any leaves, snow, or ice cleared regularly.  Use sand or salt on walking paths during winter.  Clean up any spills in your garage right away. This includes oil or grease spills. What can I do in the bathroom?  Use night lights.  Install grab bars by the toilet and in the tub and shower. Do not use towel bars as grab bars.  Use non-skid mats or decals in the tub or shower.  If you need to sit down in the shower, use a plastic, non-slip stool.  Keep the floor dry. Clean up any water that spills on the floor as soon as it happens.  Remove  soap buildup in the tub or shower regularly.  Attach bath mats securely with double-sided non-slip rug tape.  Do not have throw rugs and other things on the floor that can make you trip. What can I do in the bedroom?  Use night lights.  Make sure that you have a light by your bed that is easy to reach.  Do not use any sheets or blankets that are too big for your bed. They should not hang down onto the floor.  Have a firm chair that has side arms. You can use this for support while you get dressed.  Do not have throw rugs and other things on the floor that can make you trip. What can I do in the kitchen?  Clean up any spills right away.  Avoid walking on wet floors.  Keep items that you use a lot in easy-to-reach places.  If you need to reach something above you, use a strong step stool that has a grab bar.  Keep electrical cords out of the way.  Do not use floor polish or wax that makes floors slippery. If you must use wax, use non-skid floor wax.  Do not have throw rugs and other things on the floor that can  make you trip. What can I do with my stairs?  Do not leave any items on the stairs.  Make sure that there are handrails on both sides of the stairs and use them. Fix handrails that are broken or loose. Make sure that handrails are as long as the stairways.  Check any carpeting to make sure that it is firmly attached to the stairs. Fix any carpet that is loose or worn.  Avoid having throw rugs at the top or bottom of the stairs. If you do have throw rugs, attach them to the floor with carpet tape.  Make sure that you have a light switch at the top of the stairs and the bottom of the stairs. If you do not have them, ask someone to add them for you. What else can I do to help prevent falls?  Wear shoes that:  Do not have high heels.  Have rubber bottoms.  Are comfortable and fit you well.  Are closed at the toe. Do not wear sandals.  If you use a  stepladder:  Make sure that it is fully opened. Do not climb a closed stepladder.  Make sure that both sides of the stepladder are locked into place.  Ask someone to hold it for you, if possible.  Clearly mark and make sure that you can see:  Any grab bars or handrails.  First and last steps.  Where the edge of each step is.  Use tools that help you move around (mobility aids) if they are needed. These include:  Canes.  Walkers.  Scooters.  Crutches.  Turn on the lights when you go into a dark area. Replace any light bulbs as soon as they burn out.  Set up your furniture so you have a clear path. Avoid moving your furniture around.  If any of your floors are uneven, fix them.  If there are any pets around you, be aware of where they are.  Review your medicines with your doctor. Some medicines can make you feel dizzy. This can increase your chance of falling. Ask your doctor what other things that you can do to help prevent falls. This information is not intended to replace advice given to you by your health care provider. Make sure you discuss any questions you have with your health care provider. Document Released: 03/01/2009 Document Revised: 10/11/2015 Document Reviewed: 06/09/2014 Elsevier Interactive Patient Education  2017 [employer].

## 2020-08-16 ENCOUNTER — Encounter: Payer: Self-pay | Admitting: Family Medicine

## 2020-08-16 ENCOUNTER — Ambulatory Visit (INDEPENDENT_AMBULATORY_CARE_PROVIDER_SITE_OTHER): Payer: Medicare Other | Admitting: Family Medicine

## 2020-08-16 ENCOUNTER — Other Ambulatory Visit: Payer: Self-pay

## 2020-08-16 VITALS — BP 131/72 | HR 69 | Temp 98.0°F | Wt 258.0 lb

## 2020-08-16 DIAGNOSIS — I10 Essential (primary) hypertension: Secondary | ICD-10-CM

## 2020-08-16 DIAGNOSIS — I700 Atherosclerosis of aorta: Secondary | ICD-10-CM | POA: Diagnosis not present

## 2020-08-16 DIAGNOSIS — Z1283 Encounter for screening for malignant neoplasm of skin: Secondary | ICD-10-CM | POA: Diagnosis not present

## 2020-08-16 DIAGNOSIS — E782 Mixed hyperlipidemia: Secondary | ICD-10-CM | POA: Diagnosis not present

## 2020-08-16 MED ORDER — AMLODIPINE BESYLATE 5 MG PO TABS: 5.0000 mg | ORAL_TABLET | Freq: Every day | ORAL | 1 refills | Status: DC

## 2020-08-16 NOTE — Assessment & Plan Note (Signed)
Will keep BP and cholesterol under good control. Continue to monitor.  

## 2020-08-16 NOTE — Assessment & Plan Note (Signed)
Rechecking labs today. Await results. Treat as needed.  °

## 2020-08-16 NOTE — Progress Notes (Signed)
BP 131/72   Pulse 69   Temp 98 F (36.7 C)   Wt 258 lb (117 kg)   SpO2 97%   BMI 32.25 kg/m    Subjective:    Patient ID: Zachary Rogers., male    DOB: 1950-12-30, 70 y.o.   MRN: [id number]  HPI: Zachary Rogers is a 70 y.o. male  Chief Complaint  Patient presents with  . Hypertension  . Hyperlipidemia   HYPERTENSION / HYPERLIPIDEMIA Satisfied with current treatment? yes Duration of hypertension: chronic BP monitoring frequency: not checking BP medication side effects: no Past BP meds: amlodipine Duration of hyperlipidemia: chronic Cholesterol medication side effects: not on anything Cholesterol supplements: fish oil Past cholesterol medications: none Medication compliance: excellent compliance Aspirin: no Recent stressors: no Recurrent headaches: no Visual changes: no Palpitations: no Dyspnea: no Chest pain: no Lower extremity edema: no Dizzy/lightheaded: no  Relevant past medical, surgical, family and social history reviewed and updated as indicated. Interim medical history since our last visit reviewed. Allergies and medications reviewed and updated.  Review of Systems  Constitutional: Negative.   Respiratory: Negative.   Cardiovascular: Negative.   Gastrointestinal: Positive for diarrhea. Negative for abdominal distention, abdominal pain, anal bleeding, blood in stool, constipation, nausea, rectal pain and vomiting.  Musculoskeletal: Negative.   Skin: Negative.   Psychiatric/Behavioral: Negative.     Per HPI unless specifically indicated above     Objective:    BP 131/72   Pulse 69   Temp 98 F (36.7 C)   Wt 258 lb (117 kg)   SpO2 97%   BMI 32.25 kg/m   Wt Readings from Last 3 Encounters:  08/16/20 258 lb (117 kg)  07/23/20 257 lb (116.6 kg)  02/13/20 271 lb (122.9 kg)    Physical Exam Vitals and nursing note reviewed.  Constitutional:      General: He is not in acute distress.    Appearance: Normal appearance. He is not  ill-appearing, toxic-appearing or diaphoretic.  HENT:     Head: Normocephalic and atraumatic.     Right Ear: External ear normal.     Left Ear: External ear normal.     Nose: Nose normal.     Mouth/Throat:     Mouth: Mucous membranes are moist.     Pharynx: Oropharynx is clear.  Eyes:     General: No scleral icterus.       Right eye: No discharge.        Left eye: No discharge.     Extraocular Movements: Extraocular movements intact.     Conjunctiva/sclera: Conjunctivae normal.     Pupils: Pupils are equal, round, and reactive to light.  Cardiovascular:     Rate and Rhythm: Normal rate and regular rhythm.     Pulses: Normal pulses.     Heart sounds: Normal heart sounds. No murmur heard. No friction rub. No gallop.   Pulmonary:     Effort: Pulmonary effort is normal. No respiratory distress.     Breath sounds: Normal breath sounds. No stridor. No wheezing, rhonchi or rales.  Chest:     Chest wall: No tenderness.  Musculoskeletal:        General: Normal range of motion.     Cervical back: Normal range of motion and neck supple.  Skin:    General: Skin is warm and dry.     Capillary Refill: Capillary refill takes less than 2 seconds.     Coloration: Skin is not jaundiced or pale.  Findings: No bruising, erythema, lesion or rash.  Neurological:     General: No focal deficit present.     Mental Status: He is alert and oriented to person, place, and time. Mental status is at baseline.  Psychiatric:        Mood and Affect: Mood normal.        Behavior: Behavior normal.        Thought Content: Thought content normal.        Judgment: Judgment normal.     Results for orders placed or performed in visit on 02/13/20  Comprehensive metabolic panel  Result Value Ref Range   Glucose 99 65 - 99 mg/dL   BUN 16 8 - 27 mg/dL   Creatinine, Ser 1.24 0.76 - 1.27 mg/dL   GFR calc non Af Amer 58 (L) >59 mL/min/1.73   GFR calc Af Amer 67 >59 mL/min/1.73   BUN/Creatinine Ratio 13 10 -  24   Sodium 140 134 - 144 mmol/L   Potassium 3.9 3.5 - 5.2 mmol/L   Chloride 104 96 - 106 mmol/L   CO2 22 20 - 29 mmol/L   Calcium 9.3 8.6 - 10.2 mg/dL   Total Protein 7.1 6.0 - 8.5 g/dL   Albumin 4.7 3.7 - 4.7 g/dL   Globulin, Total 2.4 1.5 - 4.5 g/dL   Albumin/Globulin Ratio 2.0 1.2 - 2.2   Bilirubin Total 1.7 (H) 0.0 - 1.2 mg/dL   Alkaline Phosphatase 86 44 - 121 IU/L   AST 25 0 - 40 IU/L   ALT 39 0 - 44 IU/L  Lipid Panel w/o Chol/HDL Ratio  Result Value Ref Range   Cholesterol, Total 246 (H) 100 - 199 mg/dL   Triglycerides 149 0 - 149 mg/dL   HDL 62 >39 mg/dL   VLDL Cholesterol Cal 27 5 - 40 mg/dL   LDL Chol Calc (NIH) 157 (H) 0 - 99 mg/dL      Assessment & Plan:   Problem List Items Addressed This Visit      Cardiovascular and Mediastinum   Hypertension    Under good control on current regimen. Continue current regimen. Continue to monitor. Call with any concerns. Refills given. Labs drawn today.        Relevant Medications   amLODipine (NORVASC) 5 MG tablet   Aortic atherosclerosis (HCC)    Will keep BP and cholesterol under good control. Continue to monitor.       Relevant Medications   amLODipine (NORVASC) 5 MG tablet     Other   Mixed hyperlipidemia - Primary    Rechecking labs today. Await results. Treat as needed.       Relevant Medications   amLODipine (NORVASC) 5 MG tablet   Other Relevant Orders   Comprehensive metabolic panel   Lipid Panel w/o Chol/HDL Ratio    Other Visit Diagnoses    Essential hypertension       Relevant Medications   amLODipine (NORVASC) 5 MG tablet   Other Relevant Orders   Comprehensive metabolic panel   Screening for skin cancer       Referral to dermatology placed today   Relevant Orders   Ambulatory referral to Dermatology       Follow up plan: Return in about 6 months (around 02/15/2021).

## 2020-08-16 NOTE — Assessment & Plan Note (Signed)
Under good control on current regimen. Continue current regimen. Continue to monitor. Call with any concerns. Refills given. Labs drawn today.   

## 2020-08-17 LAB — COMPREHENSIVE METABOLIC PANEL
ALT: 34 IU/L (ref 0–44)
AST: 19 IU/L (ref 0–40)
Albumin/Globulin Ratio: 1.8 (ref 1.2–2.2)
Albumin: 4.7 g/dL (ref 3.7–4.7)
Alkaline Phosphatase: 73 IU/L (ref 44–121)
BUN/Creatinine Ratio: 12 (ref 10–24)
BUN: 15 mg/dL (ref 8–27)
Bilirubin Total: 1.7 mg/dL — ABNORMAL HIGH (ref 0.0–1.2)
CO2: 19 mmol/L — ABNORMAL LOW (ref 20–29)
Calcium: 9.5 mg/dL (ref 8.6–10.2)
Chloride: 104 mmol/L (ref 96–106)
Creatinine, Ser: 1.22 mg/dL (ref 0.76–1.27)
Globulin, Total: 2.6 g/dL (ref 1.5–4.5)
Glucose: 93 mg/dL (ref 65–99)
Potassium: 3.9 mmol/L (ref 3.5–5.2)
Sodium: 142 mmol/L (ref 134–144)
Total Protein: 7.3 g/dL (ref 6.0–8.5)
eGFR: 63 mL/min/{1.73_m2} (ref >59)

## 2020-08-17 LAB — LIPID PANEL W/O CHOL/HDL RATIO
Cholesterol, Total: 254 mg/dL — ABNORMAL HIGH (ref 100–199)
HDL: 66 mg/dL (ref >39)
LDL Chol Calc (NIH): 170 mg/dL — ABNORMAL HIGH (ref 0–99)
Triglycerides: 105 mg/dL (ref 0–149)
VLDL Cholesterol Cal: 18 mg/dL (ref 5–40)

## 2020-08-23 ENCOUNTER — Ambulatory Visit: Payer: Self-pay | Admitting: Urology

## 2020-08-29 ENCOUNTER — Ambulatory Visit: Payer: Medicare Other | Admitting: Urology

## 2020-09-04 ENCOUNTER — Encounter: Payer: Self-pay | Admitting: Urology

## 2020-09-04 DIAGNOSIS — M7710 Lateral epicondylitis, unspecified elbow: Secondary | ICD-10-CM | POA: Insufficient documentation

## 2020-09-04 DIAGNOSIS — M25529 Pain in unspecified elbow: Secondary | ICD-10-CM | POA: Insufficient documentation

## 2020-09-04 DIAGNOSIS — D239 Other benign neoplasm of skin, unspecified: Secondary | ICD-10-CM | POA: Insufficient documentation

## 2020-09-04 HISTORY — DX: Lateral epicondylitis, unspecified elbow: M77.10

## 2020-09-04 NOTE — Progress Notes (Signed)
09/05/2020 4:54 PM   Zachary Rogers. 23-May-1950 [id number]  Referring provider: Valerie Roys, DO [place],  [place] [address]  Chief Complaint  Patient presents with  . Nephrolithiasis   Urological history: 1. Nephrolithiasis -spontaneous passage of a 4 mm right UVJ stone in 2019 -KUB   2. BPH  -PSA 2.6 in 07/2019   HPI: Zachary Rogers. is a 70 year old male who presents today for yearly follow up.  KUB small bilateral stones.  He has no urinary complaints.    Patient denies any modifying or aggravating factors.  Patient denies any gross hematuria, dysuria or suprapubic/flank pain.  Patient denies any fevers, chills, nausea or vomiting.   UA negative for micro heme.    PMH: Past Medical History:  Diagnosis Date  . Hypertension   . Lateral epicondylitis (tennis elbow) 09/04/2020  . Mixed hyperlipidemia 07/18/2019  . Nephrolithiasis     Surgical History: Past Surgical History:  Procedure Laterality Date  . CHOLECYSTECTOMY  06/07/1998  . COLONOSCOPY    . COLONOSCOPY WITH PROPOFOL N/A 11/09/2018   Procedure: COLONOSCOPY WITH PROPOFOL;  Surgeon: [name], MD;  Location: [facility] END[facility]COPY;  Service: Gastroenterology;  Laterality: N/A;  . SKIN LESION EXCISION     dermatlogy - precancerous lesion 1 year ago on back     Home Medications:  Allergies as of 09/05/2020   No Known Allergies     Medication List       Accurate as of September 05, 2020 11:59 PM. If you have any questions, ask your nurse or doctor.        amLODipine 5 MG tablet Commonly known as: NORVASC Take 1 tablet (5 mg total) by mouth daily.   CO Q 10 PO Take by mouth.   Fish Oil 1200 MG Caps Take 1,200 mg by mouth 2 (two) times daily.   GLUCOSAMINE 1500 COMPLEX PO Take 1,500 mg by mouth 2 (two) times daily.   MSM 1500 MG Tabs Take 1,500 mg by mouth 2 (two) times daily.   VITAMIN B 12 PO Take 3,000 mcg by mouth daily.   vitamin C 1000 MG tablet Take 1,000 mg by mouth  daily.   Vitamin D3 75 MCG (3000 UT) Tabs Take 1,000 Units by mouth.   ZINC 15 PO Take 15 mg by mouth.       Allergies: No Known Allergies  Family History: Family History  Problem Relation Age of Onset  . Lung cancer Mother   . Hypertension Father   . Diabetes Father   . Prostate cancer Neg Hx   . Bladder Cancer Neg Hx   . Kidney cancer Neg Hx     Social History:  reports that he quit smoking about 22 years ago. He smoked 0.00 packs per day for 0.00 years. He has never used smokeless tobacco. He reports current alcohol use of about 2.0 - 3.0 standard drinks of alcohol per week. He reports that he does not use drugs.  ROS: For pertinent review of systems please refer to history of present illness  Physical Exam: BP 125/79   Pulse 64   Ht 6\' 3"  (1.905 m)   Wt 260 lb (117.9 kg)   BMI 32.50 kg/m   Constitutional:  Well nourished. Alert and oriented, No acute distress. HEENT: [place] AT, mask in place.  Trachea midline Cardiovascular: No clubbing, cyanosis, or edema. Respiratory: Normal respiratory effort, no increased work of breathing. GU: No CVA tenderness.  No bladder fullness or masses.  Patient with uncircumcised phallus.  Foreskin easily retracted  Urethral meatus is patent.  No penile discharge. No penile lesions or rashes. Scrotum without lesions, cysts, rashes and/or edema.  Testicles are located scrotally bilaterally. No masses are appreciated in the testicles. Left and right epididymis are normal. Rectal: Patient with  normal sphincter tone. Anus and perineum without scarring or rashes. No rectal masses are appreciated. Prostate is approximately 60 grams, could only palpate the apex, no nodules are appreciated. Seminal vesicles could not be palpated.  Lymph: No inguinal adenopathy. Neurologic: Grossly intact, no focal deficits, moving all 4 extremities. Psychiatric: Normal mood and affect.  Laboratory Data: Component     Latest Ref Rng & Units 06/02/2017 07/12/2018  07/18/2019  Prostate Specific Ag, Serum     0.0 - 4.0 ng/mL 2.7 1.8 2.6    Lab Results  Component Value Date   CREATININE 1.22 08/16/2020   Lab Results  Component Value Date   TSH 1.530 07/18/2019      Component Value Date/Time   CHOL 254 (H) 08/16/2020 0935   HDL 66 08/16/2020 0935   LDLCALC 170 (H) 08/16/2020 0935   Lab Results  Component Value Date   AST 19 08/16/2020   Lab Results  Component Value Date   ALT 34 08/16/2020    Urinalysis Component     Latest Ref Rng & Units 09/05/2020  Specific Gravity, UA     1.005 - 1.030 1.020  pH, UA     5.0 - 7.5 5.5  Color, UA     Yellow Yellow  Appearance Ur     Clear Clear  Leukocytes,UA     Negative Negative  Protein,UA     Negative/Trace Negative  Glucose, UA     Negative Negative  Ketones, UA     Negative Negative  RBC, UA     Negative Negative  Bilirubin, UA     Negative Negative  Urobilinogen, Ur     0.2 - 1.0 mg/dL 0.2  Nitrite, UA     Negative Negative  Microscopic Examination      See below:   Component     Latest Ref Rng & Units 09/05/2020  WBC, UA     0 - 5 /hpf 0-5  RBC     0 - 2 /hpf 0-2  Epithelial Cells (non renal)     0 - 10 /hpf 0-10  Bacteria, UA     None seen/Few None seen  I have reviewed the labs.   Pertinent Imaging: CLINICAL DATA:  Right kidney stone.  EXAM: ABDOMEN - 1 VIEW  COMPARISON:  CT 09/06/2019  FINDINGS: Probable 5 mm stone projecting over the lower right renal shadow. There may be an additional punctate stone projecting over the right mid kidney. Small left renal stone is also suspected. No visualized ureteral or bladder calculi. Calcifications in the pelvis are consistent with phleboliths when compared with prior CT. Cholecystectomy clips in the right upper quadrant. Normal bowel gas pattern with small volume of colonic stool.  IMPRESSION: Probable bilateral renal stones, largest on the right, measuring 5 mm projecting over the lower  pole.   Electronically Signed   By: Keith Rake M.D.   On: 09/05/2020 15:59 I have independently reviewed the films.  See HPI.     Assessment & Plan:    1. Bilateral nephrolithiasis -small punctate renal stones -no intervention warranted at this time  2. BPH  -PSA pending  Return in about  1 year (around 09/05/2021) for KUB, PSA, UA and exam .  These notes generated with voice recognition software. I apologize for typographical errors.  Zara Council, PA-C  [facility] [address] [place], [place] [address] [phone]

## 2020-09-05 ENCOUNTER — Encounter: Payer: Self-pay | Admitting: Urology

## 2020-09-05 ENCOUNTER — Ambulatory Visit (INDEPENDENT_AMBULATORY_CARE_PROVIDER_SITE_OTHER): Payer: Medicare Other | Admitting: Urology

## 2020-09-05 ENCOUNTER — Ambulatory Visit
Admission: RE | Admit: 2020-09-05 | Discharge: 2020-09-05 | Disposition: A | Discharge status: Home / Self Care | Payer: Medicare Other | Source: Ambulatory Visit | Attending: Urology | Admitting: Urology

## 2020-09-05 ENCOUNTER — Other Ambulatory Visit: Payer: Self-pay

## 2020-09-05 VITALS — BP 125/79 | HR 64 | Ht 75.0 in | Wt 260.0 lb

## 2020-09-05 DIAGNOSIS — N200 Calculus of kidney: Secondary | ICD-10-CM

## 2020-09-05 DIAGNOSIS — N400 Enlarged prostate without lower urinary tract symptoms: Secondary | ICD-10-CM

## 2020-09-05 DIAGNOSIS — Z87442 Personal history of urinary calculi: Secondary | ICD-10-CM | POA: Diagnosis not present

## 2020-09-05 DIAGNOSIS — Z9049 Acquired absence of other specified parts of digestive tract: Secondary | ICD-10-CM | POA: Diagnosis not present

## 2020-09-06 LAB — URINALYSIS, COMPLETE
Bilirubin, UA: NEGATIVE
Glucose, UA: NEGATIVE
Ketones, UA: NEGATIVE
Leukocytes,UA: NEGATIVE
Nitrite, UA: NEGATIVE
Protein,UA: NEGATIVE
RBC, UA: NEGATIVE
Specific Gravity, UA: 1.02 (ref 1.005–1.030)
Urobilinogen, Ur: 0.2 mg/dL (ref 0.2–1.0)
pH, UA: 5.5 (ref 5.0–7.5)

## 2020-09-06 LAB — MICROSCOPIC EXAMINATION: Bacteria, UA: NONE SEEN

## 2020-09-06 LAB — PSA: Prostate Specific Ag, Serum: 1.5 ng/mL (ref 0.0–4.0)

## 2020-09-25 ENCOUNTER — Encounter: Payer: Self-pay | Admitting: Family Medicine

## 2020-09-25 ENCOUNTER — Telehealth: Payer: Self-pay

## 2020-09-25 NOTE — Telephone Encounter (Signed)
Copied from [place] [id number]. Topic: General - Other >> Sep 25, 2020  2:18 PM Zachary Rogers E wrote: Reason for CRM: Pt needs a letter about his high HTN and other things needed for this letter/ please contact pt asap  to find out information needed the letter/ he can send office a PDF of the form that has questions that need to be answered.  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

## 2020-09-25 NOTE — Telephone Encounter (Signed)
Would patient need appointment for this? Patient attached form in a MyChart message.

## 2020-09-28 NOTE — Telephone Encounter (Signed)
Form filled out and patient alerted by [employer]

## 2020-10-22 ENCOUNTER — Encounter: Payer: Self-pay | Admitting: Family Medicine

## 2020-10-22 ENCOUNTER — Ambulatory Visit (INDEPENDENT_AMBULATORY_CARE_PROVIDER_SITE_OTHER): Payer: Medicare Other | Admitting: Family Medicine

## 2020-10-22 ENCOUNTER — Other Ambulatory Visit: Payer: Self-pay

## 2020-10-22 ENCOUNTER — Telehealth: Payer: Self-pay | Admitting: Family Medicine

## 2020-10-22 VITALS — BP 120/77 | HR 64 | Temp 98.2°F

## 2020-10-22 DIAGNOSIS — E782 Mixed hyperlipidemia: Secondary | ICD-10-CM | POA: Diagnosis not present

## 2020-10-22 DIAGNOSIS — Z8673 Personal history of transient ischemic attack (TIA), and cerebral infarction without residual deficits: Secondary | ICD-10-CM

## 2020-10-22 MED ORDER — ATORVASTATIN CALCIUM 80 MG PO TABS: 80.0000 mg | ORAL_TABLET | Freq: Every day | ORAL | 1 refills | Status: DC

## 2020-10-22 NOTE — Progress Notes (Addendum)
BP 120/77   Pulse 64   Temp 98.2 F (36.8 C)   SpO2 97%    Subjective:    Patient ID: Zachary Rogers., male    DOB: Apr 11, 1951, 70 y.o.   MRN: [id number]  HPI: Zachary Rogers is a 70 y.o. male  Chief Complaint  Patient presents with  . Hospitalization Follow-up    Patient states he lost control of his right arm and right leg while he was in [place]. Patient states he was told he had several mini strokes. Patient was prescribed three medications and has been taking them for a week    [name] was on a [place] Cruise last week when he started feeling unwell. He was sitting on the toilet filling a covid spit test very early in the AM when he had trouble controlling his R hand. He stood up and fell over because he was having issues with his R leg. He notes that he was having more issues with dexterity rather than with strength. He then went to bed for about an hour and a half thinking about what to do. He then woke up his wife and and then went to the hospital. Had decreased control of his R leg. Symptoms lasted for about a day, then seemed to get better. MRI at the hospital showed a "minor ischemic stroke." He was discharged and came home early to be evaluated.   Transition of Care Hospital Follow up.   Hospital/Facility: [facility] , [place] D/C Physician: Modena Slater D/C Date: 10/16/20  Records Requested: 10/22/20  Records Received: TBD- full records not available at time of visit.  Records Reviewed: TBD  Diagnoses on Discharge: ischemic stroke  Date of interactive Contact within 48 hours of discharge: not done   Date of 7 day or 14 day face-to-face visit: 10/22/20   within 7 days  Outpatient Encounter Medications as of 10/22/2020  Medication Sig  . amLODipine (NORVASC) 5 MG tablet Take 1 tablet (5 mg total) by mouth daily.  . Ascorbic Acid (VITAMIN C) 1000 MG tablet Take 1,000 mg by mouth daily.  Marland Kitchen atorvastatin (LIPITOR) 80 MG tablet Take 1 tablet (80 mg  total) by mouth daily.  . Cholecalciferol (VITAMIN D3) 3000 units TABS Take 1,000 Units by mouth.  . clopidogrel (PLAVIX) 75 MG tablet Take 1 tablet (75 mg total) by mouth daily.  . Coenzyme Q10 (CO Q 10 PO) Take by mouth.  . Cyanocobalamin (VITAMIN B 12 PO) Take 3,000 mcg by mouth daily.  . Glucosamine-Chondroit-Vit C-Mn (GLUCOSAMINE 1500 COMPLEX PO) Take 1,500 mg by mouth 2 (two) times daily.  . Methylsulfonylmethane (MSM) 1500 MG TABS Take 1,500 mg by mouth 2 (two) times daily.  . Omega-3 Fatty Acids (FISH OIL) 1200 MG CAPS Take 1,200 mg by mouth 2 (two) times daily.  Marland Kitchen UNABLE TO FIND Take 75 mg by mouth daily. Med Name: Teodoro Spray (Acetylsalcylic Acid)  . Zinc Sulfate (ZINC 15 PO) Take 15 mg by mouth.   No facility-administered encounter medications on file as of 10/22/2020.    Diagnostic Tests Reviewed: CT scan did not show bleeding MRI showed small ischemic lesions  2020-10-16, 15:36 MR brain     Previous examination DT brain native and DT angio throat and brain vessels 2020-10-15 for comparison.  Scattered small infarctions in the corona radiata next to the left lateral ventricle, gyrus precentralis left side and dorsoparietal subcortical on the same side. No bleeding. Older small infarction right cerebral hemisphere. White matter  changes periventricularly compatible with small vessel disease. Normal-sized ventricles with and convexity furrows for age.  Summary: Scattered infarcts on the left side supratentorially as above  2020-10-16 Zachary Rogers  2020-10-15, 14:35 DT brain without contrast  No bleeding. No signs of fresh ischemia. No clues to expansiveness.  2020-10-15, 14:35 DT angio - the vessels of the throat and brain  No stenoses, occlusions or signs of dissection.  2020-10-15 Zachary Rogers  Disposition: Home  Consults: None  Discharge Instructions:  Follow up here ASAP  Disease/illness Education: Discussed today with patient and his wife  Home Health/Community  Services Discussions/Referrals:  N/A  Establishment or re-establishment of referral orders for community resources: N/A  Discussion with other health care providers: N/A  Assessment and Support of treatment regimen adherence: Discussed today with patient and his wife  Appointments Coordinated with: patient and his wife  Education for self-management, independent living, and ADLs: Discussed today with patient and his wife  Since getting back from [place], he has been feeling back to normal. He's feeling a little tired, but nothing else. No weakness. No trouble speaking. No issues with dexterity. He is more worried that something else could happen and why this happened. He knows that he had an EKG and an MRI at the hospital. They did not mention any irregular rhythms. He does not think he had a carotid US. He is otherwise doing OK. No other concerns.   Relevant past medical, surgical, family and social history reviewed and updated as indicated. Interim medical history since our last visit reviewed. Allergies and medications reviewed and updated.  Review of Systems  Constitutional: Positive for fatigue. Negative for activity change, appetite change, chills, diaphoresis, fever and unexpected weight change.  HENT: Negative.   Respiratory: Negative.   Cardiovascular: Negative.   Gastrointestinal: Negative.   Musculoskeletal: Negative.   Neurological: Negative.   Psychiatric/Behavioral: Negative.     Per HPI unless specifically indicated above     Objective:    BP 120/77   Pulse 64   Temp 98.2 F (36.8 C)   SpO2 97%   Wt Readings from Last 3 Encounters:  09/05/20 260 lb (117.9 kg)  08/16/20 258 lb (117 kg)  07/23/20 257 lb (116.6 kg)    Physical Exam Vitals and nursing note reviewed.  Constitutional:      General: He is not in acute distress.    Appearance: Normal appearance. He is not ill-appearing, toxic-appearing or diaphoretic.  HENT:     Head: Normocephalic and  atraumatic.     Right Ear: External ear normal.     Left Ear: External ear normal.     Nose: Nose normal.     Mouth/Throat:     Mouth: Mucous membranes are moist.     Pharynx: Oropharynx is clear.  Eyes:     General: No scleral icterus.       Right eye: No discharge.        Left eye: No discharge.     Extraocular Movements: Extraocular movements intact.     Conjunctiva/sclera: Conjunctivae normal.     Pupils: Pupils are equal, round, and reactive to light.  Cardiovascular:     Rate and Rhythm: Normal rate and regular rhythm.     Pulses: Normal pulses.     Heart sounds: Normal heart sounds. No murmur heard. No friction rub. No gallop.   Pulmonary:     Effort: Pulmonary effort is normal. No respiratory distress.     Breath sounds: Normal breath sounds. No  stridor. No wheezing, rhonchi or rales.  Chest:     Chest wall: No tenderness.  Musculoskeletal:        General: Normal range of motion.     Cervical back: Normal range of motion and neck supple.  Skin:    General: Skin is warm and dry.     Capillary Refill: Capillary refill takes less than 2 seconds.     Coloration: Skin is not jaundiced or pale.     Findings: No bruising, erythema, lesion or rash.  Neurological:     General: No focal deficit present.     Mental Status: He is alert and oriented to person, place, and time. Mental status is at baseline.  Psychiatric:        Mood and Affect: Mood normal.        Behavior: Behavior normal.        Thought Content: Thought content normal.        Judgment: Judgment normal.     Results for orders placed or performed in visit on 09/05/20  Microscopic Examination   Urine  Result Value Ref Range   WBC, UA 0-5 0 - 5 /hpf   RBC 0-2 0 - 2 /hpf   Epithelial Cells (non renal) 0-10 0 - 10 /hpf   Bacteria, UA None seen None seen/Few  Urinalysis, Complete  Result Value Ref Range   Specific Gravity, UA 1.020 1.005 - 1.030   pH, UA 5.5 5.0 - 7.5   Color, UA Yellow Yellow    Appearance Ur Clear Clear   Leukocytes,UA Negative Negative   Protein,UA Negative Negative/Trace   Glucose, UA Negative Negative   Ketones, UA Negative Negative   RBC, UA Negative Negative   Bilirubin, UA Negative Negative   Urobilinogen, Ur 0.2 0.2 - 1.0 mg/dL   Nitrite, UA Negative Negative   Microscopic Examination See below:   PSA  Result Value Ref Range   Prostate Specific Ag, Serum 1.5 0.0 - 4.0 ng/mL      Assessment & Plan:   Problem List Items Addressed This Visit      Other   Mixed hyperlipidemia    Due for recheck on his cholesterol in a couple of weeks. Has plenty of atorvastatin at home now. Tolerating medicine well. Continue to monitor.       Relevant Medications   atorvastatin (LIPITOR) 80 MG tablet   History of stroke - Primary    Started on plavix for 3 weeks, a salycilate (Trombyl 75mg  daily) and atorvastatin 80mg  daily in [place]. Will check CBC and BMP today. No residual symptoms, so will hold on PT or OT. We are attempting to access his records from the thumb drive from [place]. If he has not had carotid US- will order. Continue current medication for now. Will get him into cardiology for ECHO/Holter ASAP- referral generated. Will get him into neurology for evaluation ASAP- referral generated. Continue to monitor. Call with any concerns.       Relevant Orders   CBC with Differential/Platelet   Basic metabolic panel   Ambulatory referral to Cardiology   Ambulatory referral to Neurology       Follow up plan: Return in about 2 weeks (around 11/05/2020).  >30 minutes spent with patient and his wife today.

## 2020-10-22 NOTE — Patient Instructions (Signed)
Mediterranean Diet A Mediterranean diet refers to food and lifestyle choices that are based on the traditions of countries located on the [place]. This way of eating has been shown to help prevent certain conditions and improve outcomes for people who have chronic diseases, like kidney disease and heart disease. What are tips for following this plan? Lifestyle  Cook and eat meals together with your family, when possible.  Drink enough fluid to keep your urine clear or pale yellow.  Be physically active every day. This includes: ? Aerobic exercise like running or swimming. ? Leisure activities like gardening, walking, or housework.  Get 7-8 hours of sleep each night.  If recommended by your health care provider, drink red wine in moderation. This means 1 glass a day for nonpregnant women and 2 glasses a day for men. A glass of wine equals 5 oz (150 mL). Reading food labels  Check the serving size of packaged foods. For foods such as rice and pasta, the serving size refers to the amount of cooked product, not dry.  Check the total fat in packaged foods. Avoid foods that have saturated fat or trans fats.  Check the ingredients list for added sugars, such as corn syrup.   Shopping  At the grocery store, buy most of your food from the areas near the walls of the store. This includes: ? Fresh fruits and vegetables (produce). ? Grains, beans, nuts, and seeds. Some of these may be available in unpackaged forms or large amounts (in bulk). ? Fresh seafood. ? Poultry and eggs. ? Low-fat dairy products.  Buy whole ingredients instead of prepackaged foods.  Buy fresh fruits and vegetables in-season from local farmers markets.  Buy frozen fruits and vegetables in resealable bags.  If you do not have access to quality fresh seafood, buy precooked frozen shrimp or canned fish, such as tuna, salmon, or sardines.  Buy small amounts of raw or cooked vegetables, salads, or olives from  the deli or salad bar at your store.  Stock your pantry so you always have certain foods on hand, such as olive oil, canned tuna, canned tomatoes, rice, pasta, and beans. Cooking  Cook foods with extra-virgin olive oil instead of using butter or other vegetable oils.  Have meat as a side dish, and have vegetables or grains as your main dish. This means having meat in small portions or adding small amounts of meat to foods like pasta or stew.  Use beans or vegetables instead of meat in common dishes like chili or lasagna.  Experiment with different cooking methods. Try roasting or broiling vegetables instead of steaming or sauteing them.  Add frozen vegetables to soups, stews, pasta, or rice.  Add nuts or seeds for added healthy fat at each meal. You can add these to yogurt, salads, or vegetable dishes.  Marinate fish or vegetables using olive oil, lemon juice, garlic, and fresh herbs. Meal planning  Plan to eat 1 vegetarian meal one day each week. Try to work up to 2 vegetarian meals, if possible.  Eat seafood 2 or more times a week.  Have healthy snacks readily available, such as: ? Vegetable sticks with hummus. ? Greek yogurt. ? Fruit and nut trail mix.  Eat balanced meals throughout the week. This includes: ? Fruit: 2-3 servings a day ? Vegetables: 4-5 servings a day ? Low-fat dairy: 2 servings a day ? Fish, poultry, or lean meat: 1 serving a day ? Beans and legumes: 2 or more servings a week ?   Nuts and seeds: 1-2 servings a day ? Whole grains: 6-8 servings a day ? Extra-virgin olive oil: 3-4 servings a day  Limit red meat and sweets to only a few servings a month   What are my food choices?  Mediterranean diet ? Recommended  Grains: Whole-grain pasta. Brown rice. Bulgar wheat. Polenta. Couscous. Whole-wheat bread. [place].  Vegetables: Artichokes. Beets. Broccoli. Cabbage. Carrots. Eggplant. Green beans. Chard. Kale. Spinach. Onions. Leeks. Peas. Squash.  Tomatoes. Peppers. Radishes.  Fruits: Apples. Apricots. Avocado. Berries. Bananas. Cherries. Dates. Figs. Grapes. Lemons. Melon. Oranges. Peaches. Plums. Pomegranate.  Meats and other protein foods: Beans. Almonds. Sunflower seeds. Pine nuts. Peanuts. [place]. Salmon. Scallops. Shrimp. [place]. Tilapia. Clams. Oysters. Eggs.  Dairy: Low-fat milk. Cheese. Greek yogurt.  Beverages: Water. Red wine. Herbal tea.  Fats and oils: Extra virgin olive oil. Avocado oil. Grape seed oil.  Sweets and desserts: [place] yogurt with honey. Baked apples. Poached pears. Trail mix.  Seasoning and other foods: Basil. Cilantro. Coriander. Cumin. Mint. Parsley. Sage. Rosemary. Tarragon. Garlic. Oregano. Thyme. Pepper. Balsalmic vinegar. Tahini. Hummus. Tomato sauce. Olives. Mushrooms. ? Limit these  Grains: Prepackaged pasta or rice dishes. Prepackaged cereal with added sugar.  Vegetables: Deep fried potatoes (french fries).  Fruits: Fruit canned in syrup.  Meats and other protein foods: Beef. Pork. Lamb. Poultry with skin. Hot dogs. [place] Salines.  Dairy: Ice cream. Sour cream. Whole milk.  Beverages: Juice. Sugar-sweetened soft drinks. Beer. Liquor and spirits.  Fats and oils: Butter. Canola oil. Vegetable oil. Beef fat (tallow). Lard.  Sweets and desserts: Cookies. Cakes. Pies. Candy.  Seasoning and other foods: Mayonnaise. Premade sauces and marinades. The items listed may not be a complete list. Talk with your dietitian about what dietary choices are right for you. Summary  The Mediterranean diet includes both food and lifestyle choices.  Eat a variety of fresh fruits and vegetables, beans, nuts, seeds, and whole grains.  Limit the amount of red meat and sweets that you eat.  Talk with your health care provider about whether it is safe for you to drink red wine in moderation. This means 1 glass a day for nonpregnant women and 2 glasses a day for men. A glass of wine equals 5 oz (150 mL). This information  is not intended to replace advice given to you by your health care provider. Make sure you discuss any questions you have with your health care provider. Document Revised: 01/03/2016 Document Reviewed: 12/27/2015 Elsevier Patient Education  [place].  Exercise After Stroke Physical activity and exercise are important for stroke recovery. The best type of exercise for you will depend on how your stroke has affected you. Loss of physical ability after a stroke is called functional limitation. This can range from no functional limitation to severe functional limitation. Work with your stroke care providers to find a safe exercise program that fits your needs and ability. This will depend on your strengths, limitations, fitness, and interests. Questions to ask your health care provider Before you begin any exercise, ask your health care providers:  What exercises are safe for you.  How many times you should repeat each exercise.  How often you should do each exercise. What are the benefits of exercise after a stroke? Exercise after a stroke has many benefits. It can:  Improve your heart and lung function.  Reduce fat in your blood (cholesterol).  Lower your blood pressure.  Improve your strength, endurance, balance, and coordination.  Give you confidence in your ability to  be independent.  Reduce your risk for depression and anxiety.  Reduce the risk of another stroke.  Improve your quality of life. What exercises can improve my strength and balance? Strengthening exercises improve muscle strength and endurance. As your muscles become stronger, you may be able to move better and become more active and independent. You can use weights and elastic bands to do these exercises.  To strengthen your shoulder blade muscles: Lie on your back with your arms at your sides. Raise one arm toward the ceiling, keeping your elbow stiff. Try to raise your shoulder blade off the floor and  hold that position for about 5 seconds.  To strengthen your shoulder and arm muscles: Lie on your back and hold one end of an elastic exercise band in each hand at your waist. The band will provide light resistance. Move one hand to your hip area, and raise your other arm sideways and upward until your hand is above your head. Keep your elbow as straight as you can.  To strengthen your upper arm muscles: Lie on your back with your arms at your sides. Place a rolled towel under one elbow. Lift up your hand toward your shoulder. Keep your elbow flat against the towel.  To improve hip balance and control: Lie flat on your back. Keep one leg flat on the floor, and bend the other leg at the knee. Lift the foot of the bent leg off the ground and cross the foot over to rest on the outside of the straight leg. Cross the foot back and forth several times.  To improve hip strength and movement: Lie on your back with your knees bent and your feet flat on the floor. Lift your hips off the floor as high as you can. Hold the position for about 5 seconds. Lower your hips back to the floor.  To improve hip and knee control: Lie on your back with your knees bent and your feet flat on the floor. Slide your leg out straight by sliding the tip of your heel along the floor. Keeping your heel on the floor, slide your leg back into the bent position.  To improve knee control for walking: Lie on your side with your knees slightly bent. Straighten the leg that is not on the floor. After the leg is straight, bend it at the knee toward your buttocks as far back as you can.  To improve balance while walking: Get down on the floor on your hands and knees. Distribute your weight evenly on both arms and legs. Rock back in a diagonal direction, shifting your weight back toward your right foot. Then rock forward toward your left hand. Switch sides and repeat.  To improve balance and knee control: Stand up straight and use the edge  of a table or chair for support. Take the weight off one leg by bending the knee. With your weight on the other leg, bend and straighten the knee. Repeat several times, bending and straightening your knee with most of your weight on your leg.  To improve balance and hip strength: Stand facing a chair or table for support with your legs slightly apart. Shift your weight to one leg while you lift the other leg sideways off the floor, keeping your knee as straight as possible. Shift your weight to the other side and lift the other leg off the floor. How much aerobic exercise should I get? Aerobic exercise is activity that increases your heart rate and breathing  rate, which in turn pumps more blood and oxygen throughout your body. Aerobic exercise can:  Increase your energy and stamina.  Strengthen your heart and lungs.  Lower your blood pressure. For most people, the recommendation is 30-45 minutes of aerobic activity on most days of the week. If you are recovering from a stroke:  Ask your stroke care provider how much aerobic activity is safe for you.  Start any exercise slowly and gradually increase your activity over time. Examples of aerobic exercise  Walking indoors or outdoors.  Walking on a treadmill.  Biking.  Riding a stationary bike.  Swimming.  Exercising or walking in a pool.  Doing housework or yard work.  Dancing.  Taking a yoga or tai chi class.      Follow these instructions at home:  Practice your exercises with your stroke care provider before starting them.  When you start exercising, have someone with you who can help if you get tired, lose your balance, or have any trouble.  Wear loose, comfortable clothing and supportive shoes with non-skid soles.  Drink plenty of water while you exercise.  Stop exercising if you feel weak, have trouble breathing, or have pain. Check with your stroke care provider before starting to exercise again.  Do not exercise  if you feel tired or unwell.  Do not exercise in very cold or warm temperatures. Summary  Physical activity and exercise are important for stroke recovery.  Exercise after a stroke has many benefits that include reducing the risk of another stroke, improving strength and balance, and improving your quality of life.  Work with your stroke care provider to find a safe exercise program that will fit your needs and abilities. This information is not intended to replace advice given to you by your health care provider. Make sure you discuss any questions you have with your health care provider. Document Revised: 12/29/2017 Document Reviewed: 12/29/2017 Elsevier Patient Education  [place].  Ischemic Stroke  An ischemic stroke (cerebrovascular accident, or CVA) is the sudden death of brain tissue that occurs when an area of the brain does not get enough blood flow. This condition is a medical emergency that must be treated right away. An ischemic stroke can cause permanent loss of brain function. Losing brain function can cause problems with how different parts of the body work. What are the causes? This condition is caused by a decrease of blood flow to an area of the brain, which may be the result of:  A small blood clot (embolus) or a buildup of plaque in the blood vessels, called atherosclerosis, that blocks blood flow in the brain.  An abnormal heart rhythm called atrial fibrillation, which sends a small blood clot to the brain.  A blocked or damaged artery in the head or neck.  Certain infections.  Inflammation of the arteries in the brain (vasculitis). Sometimes, the cause of ischemic stroke is not known. What increases the risk? The following factors may make you more likely to develop this condition: Factors that you can change  High blood pressure (hypertension) or certain other medical conditions, such as: ? Heart disease. ? Diabetes mellitus. ? High  cholesterol. ? Obesity. ? Sleep apnea. ? Migraine headache.  Smoking cigarettes or using other tobacco products.  Physical inactivity.  Heavy alcohol use.  Use of illegal drugs, especially cocaine and methamphetamine.  Taking birth control pills, especially if you also use tobacco. Factors that you cannot change  Being older than age 38.  History of blood clots, stroke, or mini-stroke (transient ischemic attack, TIA).  History of high blood pressure when pregnant (preeclampsia), in women.  Family history of stroke.  Sickle cell disease.  Blood clotting disorders (hypercoagulable state). What are the signs or symptoms? Symptoms of this condition usually develop suddenly, or you may notice them after waking from sleep. These sudden symptoms may include:  Weakness or numbness of your face, arm, or leg, especially on one side of your body.  Loss of balance or coordination.  Slurred speech, or aphasia. Aphasia is trouble speaking or trouble understanding speech or both.  Vision changes in one or both eyes. You may have double vision, blurred vision, or loss of vision.  Dizziness or confusion.  Nausea and vomiting.  Severe headache with no known cause. If possible, write down the exact time that you last felt like your normal self and what time your symptoms started. Tell your health care provider. If symptoms come and go, they could be signs of a TIA (transient ischemic attack). Get help right away, even if you feel better. How is this diagnosed? This condition may be diagnosed based on:  Your symptoms, your medical history, and a physical exam.  CT scan of the brain.  MRI.  Imaging tests that scan blood flow (circulation) in the brain. These may be CT angiogram, MRI angiogram, or cerebral angiogram. You may need to see a health care provider who specializes in stroke care. A stroke specialist can be seen in person or through communication using telephone or  television technology (telemedicine). You may also have other tests, including:  Electrocardiogram (ECG).  Continuous heart monitoring.  Transthoracic echocardiogram (TTE).  Transesophageal echocardiogram (TEE).  Carotid ultrasound.  Blood tests.  Sleep study to check for sleep apnea. How is this treated? Treatment for this condition depends on the duration, severity, and cause of your symptoms and on the area of the brain affected. It is very important to get treatment at the first sign of stroke symptoms. Some treatments work better if they are done within 3-6 hours of the start of stroke symptoms. These initial treatments may include:  Thrombolytic medicine that is injected to dissolve the blood clot.  Treatments given directly to the affected artery to remove or dissolve the blood clot.  Medicines to control blood pressure.  Anticoagulant or antiplatelet medicines to thin the blood. Other treatments may include:  Oxygen.  IV fluids.  Procedures to increase blood flow. Medicines and diet changes may be used to help manage risk factors for stroke, such as diabetes, high cholesterol, and high blood pressure. After a stroke, you may work with physical, speech, mental health, or occupational therapists to help you recover. Follow these instructions at home: Medicines  Take over-the-counter and prescription medicines only as told by your health care provider.  If you were told to take a medicine to thin your blood, take your medicine exactly as told, at the same time every day. This includes aspirin or an anticoagulant. ? Taking too much blood-thinning medicine can cause bleeding. ? If you do not take enough blood-thinning medicine, you will not be protected enough against another stroke and other problems.  Understand the side effects of taking anticoagulant medicine. When taking this type of medicine, make sure you: ? Hold pressure over any cuts for longer than  usual. ? Tell your dentist and other health care providers that you are taking anticoagulants before you have any procedures that may cause bleeding. ? Avoid activities that  could cause injury or bruising. ? Wear a medical alert bracelet or carry a card that lists what medicines you take. Eating and drinking  Follow instructions from your health care provider about diet.  Eat healthy foods.  If your stroke affected your ability to swallow, you may need to take steps to avoid choking. These may include taking small bites when eating and eating foods that are soft or pureed. Safety  Follow instructions from your health care team about physical activity.  Use a walker or cane as told by your health care provider.  Take steps to create a safe home environment to lower your risk of falls. These steps may include: ? Having your home looked at by specialists. ? Installing grab bars in the bedroom and bathroom. ? Using safety equipment, such as raised toilets and a seat in the shower. General instructions  Do not use any products that contain nicotine or tobacco, such as cigarettes, e-cigarettes, and chewing tobacco. If you need help quitting, ask your health care provider.  If you drink alcohol: ? Limit how much you use to:  0-1 drink a day for women.  0-2 drinks a day for men. ? Be aware of how much alcohol is in your drink. In the [place] one drink equals one 12 oz bottle of beer (355 mL), one 5 oz glass of wine (148 mL), or one 1 oz glass of hard liquor (44 mL).  If you need help to stop using drugs or alcohol, ask your health care provider about a referral to a program or specialist.  Maintain an active and healthy lifestyle. Get regular exercise as told.  Wear a medical bracelet as told by your health care provider.  Keep all follow-up visits as told by your health care provider, including visits with all specialists on your health care team. This is important. How is this  prevented? You can lower your risk of another stroke by managing high blood pressure, high cholesterol, diabetes, heart disease, sleep apnea, and obesity. Your risk can also be lowered by quitting smoking, limiting alcohol, and staying physically active. Your health care provider will continue to help you with ways to prevent short-term and long-term problems caused by stroke. Get help right away if: You have any symptoms of a stroke. "BE FAST" is an easy way to remember the main warning signs of a stroke:  B - Balance. Signs are dizziness, sudden trouble walking, or loss of balance.  E - Eyes. Signs are trouble seeing or a sudden change in vision.  F - Face. Signs are sudden weakness or numbness of the face, or the face or eyelid drooping on one side.  A - Arms. Signs are weakness or numbness in an arm. This happens suddenly and usually on one side of the body.  S - Speech. Signs are sudden trouble speaking, slurred speech, or trouble understanding what people say.  T - Time. Time to call emergency services. Write down what time symptoms started. You have other signs of a stroke, such as:  A sudden, severe headache with no known cause.  Nausea or vomiting.  Seizure. These symptoms may represent a serious problem that is an emergency. Do not wait to see if the symptoms will go away. Get medical help right away. Call your local emergency services (911 in the [place] Do not drive yourself to the hospital.   Summary  An ischemic stroke (cerebrovascular accident, or CVA) is the sudden death of brain tissue that occurs  when an area of the brain does not get enough blood flow.  Symptoms of this condition usually develop suddenly, or you may notice them after waking from sleep.  It is very important to get treatment at the first sign of stroke symptoms. Stroke is a medical emergency that must be treated right away. This information is not intended to replace advice given to you by your health  care provider. Make sure you discuss any questions you have with your health care provider. Document Revised: 05/02/2019 Document Reviewed: 05/02/2019 Elsevier Patient Education  2021 [place] After a Stroke Caregivers provide essential physical and emotional support to people who have had a stroke. If you are supporting someone who has had a stroke, you play an important role in coordinating schedules, helping your loved one to communicate, and helping with the overall rehabilitation plan. What do I need to know about my loved one's recovery? Recovering from a stroke can take weeks, months, or years. Some people may be able to return to a normal lifestyle, and other people may have permanent problems with movement (mobility), thinking, behavior, or communication. Understanding your loved one's condition can help you manage the role of caregiver. Your loved one's health care team may rely on you to provide information such as medical history and current medicines. How can I support my friend or family member? Planning for discharge from the hospital Ask to meet with a social worker or a stroke care coordinator, if one is available. This person can help you to plan for discharge. Before you leave the hospital, make sure you understand:  The recovery process after a stroke.  Physical, emotional, behavioral, and other changes that may affect your loved one after a stroke.  The treatments for stroke, including the medicines that your loved one has to take.  How to lower the risk of another stroke.  Diet and exercise changes for your loved one.  Whether you will need extra help at home.  Whether your loved one will need help using the bathroom, bathing, eating, or doing other activities.  Changes you have to make at home to make it safe for your loved one.  Whether you need to get special devices or equipment for your loved one. General help  Help your loved one  establish a daily routine. This may include setting reminders or having a shared day planner or calendar.  Encourage rest. Your loved one may need frequent breaks during social situations or other activities.  Be patient. Your loved one may take longer to complete tasks and to process information.  When giving instructions, give only one instruction at a time or give step-by-step lists. Multitasking can be difficult after a stroke.  Offer assistance with household chores or other daily tasks. Make "freezer meals" that can be reheated.  Do not set expectations about your loved one's recovery. Medical visits  Gently remind your loved one of tasks and medical visits if he or she is forgetful.  Provide transportation to and from appointments.  Attend rehabilitation appointments with your loved one. By being involved in the rehabilitation plan, you can encourage your loved one and help with exercises and therapy activities at home. Preventing falls  Follow instructions to prevent falls in your loved one's home. These may include: ? Installing grab bars in bathrooms and handrails in stairways. ? Using night-lights in the bedroom, bathroom, or hallways. ? Removing rugs and mats or making them stick to the floor. ? Keeping  walkways clear by removing cords and clutter from the floor.   Managing finances  Help to manage your loved one's finances. Talk with a Education officer, museum or legal professional if: ? Your loved one is unable to return to work and is in need of financial help. ? You need help paying medical bills. ? You need to establish guardianship over finances. ? You need help with estate planning. How should I care for myself? Helping a loved one recover from a stroke can be rewarding, and it can also be challenging and stressful at times. Make sure to care for your own well-being during this time. Lifestyle  Rest. Try to get 7-9 hours of uninterrupted sleep each night.  Eat a balanced  diet that includes fresh fruits and vegetables, whole grains, lean proteins, and low-fat dairy.  Exercise for 30 or more minutes on 5 or more days each week.  Find ways to manage stress. These may include: ? Deep breathing, yoga, or meditation. ? Spending time outdoors. ? Journaling. Finding support  Ask for help. Take a break if you are the primary caregiver to your loved one.  Spend time with supportive people.  Join a support group with other caregivers or family members of people who have had a stroke.  If you experience new or worsening depression or anxiety, seek counseling from a mental health professional.   Where to find more information You may find more information about supporting someone who has had a stroke from:  National Stroke Association: FetchFilms.dk  American Stroke Association: www.strokeassociation.org/STROKEORG Summary  Caregivers provide essential physical and emotional support to people who have had a stroke.  Before you leave the hospital, make sure you understand how to care for someone who has had a stroke.  It is normal to have many different emotions while caring for someone who has had a stroke. Make sure to care for your own well-being during this time. This information is not intended to replace advice given to you by your health care provider. Make sure you discuss any questions you have with your health care provider. Document Revised: 08/26/2018 Document Reviewed: 08/22/2016 Elsevier Patient Education  [place].  Preventing Cerebrovascular Disease Cerebrovascular disease affects the arteries that supply the brain. Any condition that blocks or disrupts blood flow to the brain can cause cerebrovascular disease. When brain cells lose blood supply, they start to die within minutes, which results in a stroke. Stroke is the main danger of cerebrovascular disease. Stroke is a leading cause of death and  disability. Many conditions can cause cerebrovascular disease and stroke. Making diet and lifestyle changes to prevent these conditions lowers your risk of cerebrovascular disease. How can making changes to prevent cerebrovascular disease affect me? Making these changes lowers your risk of many conditions that can cause cerebrovascular disease and stroke, including:  Atherosclerosis. This is narrowing and hardening of an artery, which happens when fat, cholesterol, calcium, or other substances (plaque) build up inside an artery. Plaque reduces blood flow through the artery.  High blood pressure. This increases the risk of building up plaque in your arteries or bleeding inside the brain, which can lead to stroke. By making these changes, you can also improve your overall health and quality of life. What can increase my risk of developing cerebrovascular disease? Certain factors make you more likely to develop cerebrovascular disease. Some of these factors are things that you cannot control, including:  Being over 99 years of age.  Being male. Strokes happen  more often in women.  Family history. Having a parent or sibling who had a stroke before age 39 increases your risk of a stroke.  Having certain health conditions, such as: ? Diabetes. ? High blood pressure. ? Heart disease. ? Atherosclerosis. ? High cholesterol. ? Sleep apnea. ? Sickle cell disease.  Having a personal history of transient ischemic attack or a prior stroke. Other risk factors are things that you can control, including:  Being overweight.  Using tobacco products.  Not being physically active.  Eating a high-fat diet.  Alcohol or drug abuse. Talk with your health care provider about your risk for cerebrovascular disease. Work with your health care provider to manage any diseases that you have that may contribute to cerebrovascular disease. Your health care provider may prescribe medicines to help prevent  major causes of cerebrovascular disease. What actions can I take to prevent this? General instructions  If directed by your health care provider, check your blood pressure regularly. Track your blood pressure and report the readings to your health care provider.  If you are overweight, ask your health care provider to recommend a weight-loss plan for you. Losing 5-10 lb (2.2-4.5 kg) can reduce your risk of diabetes, atherosclerosis, and high blood pressure. Nutrition  Eat more fruits, vegetables, and whole grains.  Reduce how much saturated fat you eat. To do this, eat less red meat and fewer full-fat dairy products.  Eat healthy proteins instead of red meat. Healthy proteins include: ? Fish. Eat fish that contains heart-healthy omega-3 fatty acids. Try to eat these twice a week. Examples include salmon, albacore tuna, mackerel, and herring. ? Chicken and [place]. ? Nuts and seeds. ? Low-fat or nonfat yogurt.  Avoid precooked or cured meat, such as bacon, sausages, and meat loaves.  Avoid foods that contain: ? A lot of sugar, such as sweets and drinks with added sugar. ? A lot of salt (sodium). Avoid adding extra salt to your food, as told by your health care provider. ? Trans fats (trans-fatty acids), such as margarine and fats in baked goods. Trans fats may be listed as "partially hydrogenated oils" on food labels.  Check food labels to see how much sodium, sugar, and trans fats are in foods.  Use vegetable oils that contain low amounts of saturated fat, such as olive oil or canola oil.   Lifestyle  Exercise for 30?60 minutes on most days, or as much as told by your health care provider. Do moderate-intensity exercise, such as brisk walking, bicycling, and water aerobics. Ask your health care provider which activities are safe for you.  Do not use any products that contain nicotine or tobacco, such as cigarettes, e-cigarettes, and chewing tobacco. If you need help quitting, ask your  health care provider.  Do not drink alcohol if: ? Your health care provider tells you not to drink. ? You are pregnant, may be pregnant, or are planning to become pregnant.  If you drink alcohol: ? Limit how much you use to:  0-1 drink a day for women.  0-2 drinks a day for men. ? Be aware of how much alcohol is in your drink. In the [place] one drink equals one 12 oz bottle of beer (355 mL), one 5 oz glass of wine (148 mL), or one 1 oz glass of hard liquor (44 mL). Where to find more information Learn more about preventing cerebrovascular disease from:  [place] [place]: https://wilson-eaton.com/  Centers for Disease Control and Prevention: [place].si  American Stroke Association: www.stroke.org Contact a health care provider if: You develop any of the following symptoms:  Headaches that keep coming back (chronic headaches).  Nausea.  Vision problems.  Increased sensitivity to noise or light.  Depression or mood swings.  Anxiety or irritability.  Memory problems.  Difficulty concentrating or paying attention.  Sleep problems.  Feeling tired all the time. Get help right away if you:  Have a partial or total loss of consciousness.  Are taking blood thinners and you fall or you experience a minor injury to the head.  Have a bleeding disorder and you fall or you experience minor trauma to the head.  Have any symptoms of a stroke. "BE FAST" is an easy way to remember the main warning signs of a stroke: ? B - Balance. Signs are dizziness, sudden trouble walking, or loss of balance. ? E - Eyes. Signs are trouble seeing or a sudden change in vision. ? F - Face. Signs are sudden weakness or numbness of the face, or the face or eyelid drooping on one side. ? A - Arms. Signs are weakness or numbness in an arm. This happens suddenly and usually on one side of the body. ? S - Speech. Signs are sudden trouble speaking, slurred speech, or trouble  understanding what people say. ? T - Time. Time to call emergency services. Write down what time symptoms started.  Have other signs of a stroke, such as: ? A sudden, severe headache with no known cause. ? Nausea or vomiting. ? Seizure. These symptoms may represent a serious problem that is an emergency. Do not wait to see if the symptoms will go away. Get medical help right away. Call your local emergency services (911 in the [place] Do not drive yourself to the hospital. Summary  Cerebrovascular disease is a condition that affects the arteries that supply the brain. Stroke is the main danger of cerebrovascular disease.  Common causes of cerebrovascular disease include atherosclerosis and high blood pressure.  Making diet and lifestyle changes can reduce your risk of cerebrovascular disease.  Work with your health care provider to manage any diseases that you have that may contribute to cerebrovascular disease. This information is not intended to replace advice given to you by your health care provider. Make sure you discuss any questions you have with your health care provider. Document Revised: 04/19/2019 Document Reviewed: 04/19/2019 Elsevier Patient Education  [place] After an Ischemic Stroke  After an ischemic stroke, one or more types of medicine may be prescribed. Some of these medicines help to keep your blood from clotting and may prevent strokes from happening in the future. Other medicines may be used to help you manage health problems that can increase your risk for another stroke. It is important to take all over-the-counter and prescription medicines only as told by your health care provider. Tell a health care provider about:  Any allergies you have.  All medicines you are taking, including vitamins, herbs, eye drops, creams, and over-the-counter medicines.  Any problems you or family members have had with anesthetic medicines.  Any blood  disorders you have.  Any surgeries you have had.  Any medical conditions you have.  Whether you are pregnant or may be pregnant. What types of medicines might be prescribed? Blood thinning medicines Blood thinning medicines include antiplatelet and anticoagulant medicines.  Antiplatelet medicines ? When a person starts to bleed, blood platelets release a chemical (thromboxane) that tells blood to clot. Antiplatelet  medicines help to limit this chemical. This makes it harder for your blood to clot and can lower your risk of another stroke. The most common antiplatelet medicine is aspirin. ? You may need to take these medicines long-term to help prevent another stroke. ? You may need to take more than one antiplatelet medicine or use these medicines in certain situations. ? People with certain conditions, such as gastrointestinal disease, bleeding disorders, or a history of kidney or liver disease, may not be able to take antiplatelet medicines.  Anticoagulant medicines ? Anticoagulant medicines keep the liver from making certain blood-clotting proteins. This makes it hard for your blood to clot and can lower your risk of another stroke. ? These medicines are used to treat strokes that were caused by blood clots that formed in the heart. They are also used to treat people with an irregular heartbeat (atrial fibrillation, or AFib) who have had a stroke. ? Your blood may need to be monitored more frequently to make sure you are on the right dose of anticoagulant medicine. It is very important to keep these appointments with your health care provider. These medicines can increase the risk of bleeding.  You may need to take certain precautions, such as: ? Using a soft toothbrush. ? Being careful when handling knives or razors. ? Avoiding activities that could cause injury or bruising, and following instructions about how to prevent falls. ? Avoiding contact sports or other activities that may  cause trauma or injury. ? Telling your dental care provider and other health care providers that you are taking anticoagulants or antiplatelets before you have any procedures that may cause bleeding. ? Holding pressure over any cuts for longer than usual. ? Wearing a medical alert bracelet that says you are taking an anticoagulant or antiplatelet.  Talk with your health care provider before you take any medicines that contain NSAIDs, such as ibuprofen or naproxen. These medicines increase your risk for dangerous bleeding.  Take your medicine exactly as told, at the same time every day. Cholesterol medicines  High cholesterol levels can lead to a buildup of plaque in the arteries. This can be a risk factor for stroke. Medicines called statins can reduce the risk of another stroke in people who have had a stroke.  Other cholesterol medicines can be used with statins to lower cholesterol even more, or they can be used if statins are not well tolerated.  Lifestyle changes, such as exercising more and eating a healthy diet, are also recommended to help lower cholesterol. Blood pressure medicines  Keeping your blood pressure below 130/80 mmHg will lower the chances of another stroke. Managing your blood pressure is one of the most important ways to lower the chance of another stroke.  Any blood pressure medicine can be used to lower your blood pressure into the normal range. But diuretics, ACE inhibitors, and angiotensin receptor blockers (ARBs) are preferred in most people.  Lifestyle changes, such as diet and exercise, are also recommended to help lower your blood pressure.  Ask your health care provider how often you should take your blood pressure. Write down your numbers and take them to your appointments to make sure your blood pressure is in the desired range. Ask what your numbers should be and when to contact your health care provider if the numbers are not in range. Diabetes  medicines  Diabetes is a risk factor for a stroke.  Medicines and lifestyle changes, such as increasing physical activity and eating healthier foods,  lower your blood sugar (glucose) if your levels are too high.  Ask your health care provider if you should check your blood glucose at home and how often. Write down your numbers and take them to your appointments to make sure your blood glucose is in the desired range. Ask what your numbers should be and when to contact your health care provider if the numbers are out of range. Follow these instructions at home:  Certain vitamins and foods can affect how these medicines work. Talk with your health care provider about which foods, vitamins, and other medicines you should avoid.  Take your medicine exactly as told, at the same time every day.  Do not take over-the-counter medicines without talking to your health care provider.  It is important to follow directions as told on checking your blood glucose and your blood pressure at home. Be sure to write down your numbers.  Certain anticoagulant medicines require regular blood tests to check that you are taking the right dosage. It is very important to keep these appointments. Get help right away if:  You have any signs of unusual bleeding, such as bleeding from your gums, blood in your urine or stool (feces), nosebleeds, or vomiting blood.  You fall and hit your head while taking blood-thinning medicines.  You have an allergic reaction to any medicines you are taking. These symptoms may represent a serious problem that is an emergency. Do not wait to see if the symptoms will go away. Get medical help right away. Call your local emergency services (911 in the [place] Do not drive yourself to the hospital. Summary  After a stroke, you may be given medicines that make it hard for your blood to clot. This can help to prevent future strokes.  You may be given other medicines to help manage health  conditions that can increase your risk of another stroke.  Medicines that are commonly prescribed after a stroke include blood thinners, cholesterol medicines, blood pressure medicines, and diabetes medicines.  It is important to take all over-the-counter and prescription medicines only as told by your health care provider.  Talk with your health care provider about how to prevent falls and injuries, and what foods and vitamins to avoid while taking these medicines. This information is not intended to replace advice given to you by your health care provider. Make sure you discuss any questions you have with your health care provider. Document Revised: 01/24/2020 Document Reviewed: 01/24/2020 Elsevier Patient Education  2021 [employer].

## 2020-10-22 NOTE — Assessment & Plan Note (Addendum)
Started on plavix for 3 weeks, a salycilate (Trombyl 75mg  daily) and atorvastatin 80mg  daily in [place]. Will check CBC and BMP today. No residual symptoms, so will hold on PT or OT. We are attempting to access his records from the thumb drive from [place]. If he has not had carotid US- will order. Continue current medication for now. Will get him into cardiology for ECHO/Holter ASAP- referral generated. Will get him into neurology for evaluation ASAP- referral generated. Continue to monitor. Call with any concerns.   It appears that he had a CT angio of the vessels of his throat and brain which looked normal. Will hold on carotid US at this time.

## 2020-10-22 NOTE — Progress Notes (Unsigned)
2020-10-16, 15:36 MR brain     Previous examination DT brain native and DT angio throat and brain vessels 2020-10-15 for comparison.  Scattered small infarctions in the corona radiata next to the left lateral ventricle, gyrus precentralis left side and dorsoparietal subcortical on the same side. No bleeding. Older small infarction right cerebral hemisphere. White matter changes periventricularly compatible with small vessel disease. Normal-sized ventricles with and convexity furrows for age.  Summary: Scattered infarcts on the left side supratentorially as above  2020-10-16 Niel Hummer, [place]

## 2020-10-22 NOTE — Telephone Encounter (Signed)
error 

## 2020-10-22 NOTE — Assessment & Plan Note (Signed)
Due for recheck on his cholesterol in a couple of weeks. Has plenty of atorvastatin at home now. Tolerating medicine well. Continue to monitor.

## 2020-10-23 LAB — BASIC METABOLIC PANEL
BUN/Creatinine Ratio: 11 (ref 10–24)
BUN: 15 mg/dL (ref 8–27)
CO2: 22 mmol/L (ref 20–29)
Calcium: 9.4 mg/dL (ref 8.6–10.2)
Chloride: 105 mmol/L (ref 96–106)
Creatinine, Ser: 1.31 mg/dL — ABNORMAL HIGH (ref 0.76–1.27)
Glucose: 97 mg/dL (ref 65–99)
Potassium: 4 mmol/L (ref 3.5–5.2)
Sodium: 141 mmol/L (ref 134–144)
eGFR: 58 mL/min/{1.73_m2} — ABNORMAL LOW (ref >59)

## 2020-10-23 LAB — CBC WITH DIFFERENTIAL/PLATELET
Basophils Absolute: 0.1 10*3/uL (ref 0.0–0.2)
Basos: 1 %
EOS (ABSOLUTE): 0.3 10*3/uL (ref 0.0–0.4)
Eos: 5 %
Hematocrit: 44.6 % (ref 37.5–51.0)
Hemoglobin: 15.7 g/dL (ref 13.0–17.7)
Immature Grans (Abs): 0 10*3/uL (ref 0.0–0.1)
Immature Granulocytes: 0 %
Lymphocytes Absolute: 1.3 10*3/uL (ref 0.7–3.1)
Lymphs: 20 %
MCH: 30.8 pg (ref 26.6–33.0)
MCHC: 35.2 g/dL (ref 31.5–35.7)
MCV: 88 fL (ref 79–97)
Monocytes Absolute: 0.6 10*3/uL (ref 0.1–0.9)
Monocytes: 9 %
Neutrophils Absolute: 4.2 10*3/uL (ref 1.4–7.0)
Neutrophils: 65 %
Platelets: 199 10*3/uL (ref 150–450)
RBC: 5.09 x10E6/uL (ref 4.14–5.80)
RDW: 13.3 % (ref 11.6–15.4)
WBC: 6.4 10*3/uL (ref 3.4–10.8)

## 2020-10-24 ENCOUNTER — Ambulatory Visit (INDEPENDENT_AMBULATORY_CARE_PROVIDER_SITE_OTHER): Payer: Medicare Other

## 2020-10-24 ENCOUNTER — Encounter: Payer: Self-pay | Admitting: Internal Medicine

## 2020-10-24 ENCOUNTER — Other Ambulatory Visit: Payer: Self-pay

## 2020-10-24 ENCOUNTER — Ambulatory Visit (INDEPENDENT_AMBULATORY_CARE_PROVIDER_SITE_OTHER): Payer: Medicare Other | Admitting: Internal Medicine

## 2020-10-24 VITALS — BP 124/80 | HR 60 | Ht 75.0 in | Wt 262.0 lb

## 2020-10-24 DIAGNOSIS — I6389 Other cerebral infarction: Secondary | ICD-10-CM

## 2020-10-24 DIAGNOSIS — I6789 Other cerebrovascular disease: Secondary | ICD-10-CM

## 2020-10-24 DIAGNOSIS — I639 Cerebral infarction, unspecified: Secondary | ICD-10-CM

## 2020-10-24 DIAGNOSIS — E785 Hyperlipidemia, unspecified: Secondary | ICD-10-CM | POA: Diagnosis not present

## 2020-10-24 DIAGNOSIS — I10 Essential (primary) hypertension: Secondary | ICD-10-CM

## 2020-10-24 NOTE — Progress Notes (Signed)
New Outpatient Visit Date: 10/24/2020  Referring Provider: Valerie Roys, DO [place],  [place] [address]  Chief Complaint: Recent stroke  HPI:  Mr. Dominica Severin is a 70 y.o. male who is being seen today for urgent evaluation of recent stroke at the request of Dr. Wynetta Emery. He has a history of recent stroke while on a cruise near [place], hypertension, hyperlipidemia, and nephrolithiasis.  Mr. Dominica Severin was in his usual state of health until late May while he was on a cruise in [place].  He was seated on the toilet one morning and suddenly developed weakness and discoordination in his right arm and leg.  He fell while trying to get out of the bathroom but was able to pull himself into bed where he stayed for about 2 hours until his wife woke up.  At that point, he informed her of his weakness and he was taken to the infirmary for further evaluation.  There was concern for acute stroke, prompting him to be transferred to a regional hospital where brain MRI showed scattered small left-sided cerebral infarctions.  Older small right cerebral hemispheric infarcts were also noted.  He believes that he may have undergone an echocardiogram but does not recall specifics.  Mr. Dominica Severin reports that his right-sided weakness gradually resolved over the next day or 2 and that he no longer has any residual deficits.  Mr. Dominica Severin does not have a history of heart disease other than an incidentally noted RBBB that was discovered while he was in the [employer].  He denies chest pain, shortness of breath, palpitations, lightheadedness, orthopnea, PND, and edema.  He is currently on aspirin and clopidogrel as well as atorvastatin and amlodipine.  He is tolerating these medications well.  --------------------------------------------------------------------------------------------------  Cardiovascular History & Procedures: Cardiovascular Problems: Stroke  Risk Factors: Hypertension, hyperlipidemia, male gender, tobacco use,  obesity, and age greater than 109  Cath/PCI: None  CV Surgery: None  EP Procedures and Devices: None  Non-Invasive Evaluation(s): None available.  Echocardiogram may have been done during recent hospitalization in [place].  Recent CV Pertinent Labs: Lab Results  Component Value Date   CHOL 254 (H) 08/16/2020   HDL 66 08/16/2020   LDLCALC 170 (H) 08/16/2020   TRIG 105 08/16/2020   K 4.0 10/22/2020   BUN 15 10/22/2020   CREATININE 1.31 (H) 10/22/2020    --------------------------------------------------------------------------------------------------  Past Medical History:  Diagnosis Date   Hypertension    Lateral epicondylitis (tennis elbow) 09/04/2020   Mixed hyperlipidemia 07/18/2019   Nephrolithiasis     Past Surgical History:  Procedure Laterality Date   CHOLECYSTECTOMY  06/07/1998   COLONOSCOPY     COLONOSCOPY WITH PROPOFOL N/A 11/09/2018   Procedure: COLONOSCOPY WITH PROPOFOL;  Surgeon: [name], MD;  Location: [facility];  Service: Gastroenterology;  Laterality: N/A;   SKIN LESION EXCISION     dermatlogy - precancerous lesion 1 year ago on back     Current Meds  Medication Sig   amLODipine (NORVASC) 5 MG tablet Take 1 tablet (5 mg total) by mouth daily.   Ascorbic Acid (VITAMIN C) 1000 MG tablet Take 1,000 mg by mouth daily.   atorvastatin (LIPITOR) 80 MG tablet Take 1 tablet (80 mg total) by mouth daily.   Cholecalciferol (VITAMIN D3) 3000 units TABS Take 1,000 Units by mouth daily.   clopidogrel (PLAVIX) 75 MG tablet Take 1 tablet (75 mg total) by mouth daily.   Coenzyme Q10 (CO Q 10 PO) Take by mouth daily.  Cyanocobalamin (VITAMIN B 12 PO) Take 3,000 mcg by mouth daily.   Glucosamine-Chondroit-Vit C-Mn (GLUCOSAMINE 1500 COMPLEX PO) Take 1,500 mg by mouth 2 (two) times daily.   Methylsulfonylmethane (MSM) 1500 MG TABS Take 1,500 mg by mouth 2 (two) times daily.   Omega-3 Fatty Acids (FISH OIL) 1200 MG CAPS Take 1,200 mg by mouth 2 (two) times daily.    UNABLE TO FIND Take 75 mg by mouth daily. Med Name: Teodoro Spray (Acetylsalcylic Acid)   Zinc Sulfate (ZINC 15 PO) Take 15 mg by mouth daily.    Allergies: Patient has no known allergies.  Social History   Tobacco Use   Smoking status: Former Smoker    Packs/day: 0.00    Years: 0.00    Pack years: 0.00    Quit date: 2000    Years since quitting: 22.4   Smokeless tobacco: Never Used   Tobacco comment: social smoker when younger   Vaping Use   Vaping Use: Never used  Substance Use Topics   Alcohol use: Yes    Alcohol/week: 5.0 standard drinks    Types: 5 Glasses of wine per week   Drug use: No    Family History  Problem Relation Age of Onset   Lung cancer Mother    Hypertension Father    Diabetes Father    Transient ischemic attack Father    Heart attack Maternal Grandfather    Prostate cancer Neg Hx    Bladder Cancer Neg Hx    Kidney cancer Neg Hx     Review of Systems: A 12-system review of systems was performed and was negative except as noted in the HPI.  --------------------------------------------------------------------------------------------------  Physical Exam: BP 124/80 (BP Location: Right Arm, Patient Position: Sitting, Cuff Size: Large)   Pulse 60   Ht 6\' 3"  (1.905 m)   Wt 262 lb (118.8 kg)   SpO2 96%   BMI 32.75 kg/m   General: NAD.  Accompanied by his wife. HEENT: No conjunctival pallor or scleral icterus. Facemask in place. Neck: Supple without lymphadenopathy, thyromegaly, JVD, or HJR. No carotid bruit. Lungs: Normal work of breathing. Clear to auscultation bilaterally without wheezes or crackles. Heart: Regular rate and rhythm without murmurs, rubs, or gallops. Non-displaced PMI. Abd: Bowel sounds present. Soft, NT/ND without hepatosplenomegaly Ext: No lower extremity edema. Radial, PT, and DP pulses are 2+ bilaterally Skin: Warm and dry without rash. Neuro: CNIII-XII intact. Strength and fine-touch sensation intact in upper and lower  extremities bilaterally. Psych: Normal mood and affect.  EKG: Normal sinus rhythm with left anterior fascicular block.  Otherwise, no significant abnormality.  No prior tracing available for comparison.  Lab Results  Component Value Date   WBC 6.4 10/22/2020   HGB 15.7 10/22/2020   HCT 44.6 10/22/2020   MCV 88 10/22/2020   PLT 199 10/22/2020    Lab Results  Component Value Date   NA 141 10/22/2020   K 4.0 10/22/2020   CL 105 10/22/2020   CO2 22 10/22/2020   BUN 15 10/22/2020   CREATININE 1.31 (H) 10/22/2020   GLUCOSE 97 10/22/2020   ALT 34 08/16/2020    Lab Results  Component Value Date   CHOL 254 (H) 08/16/2020   HDL 66 08/16/2020   LDLCALC 170 (H) 08/16/2020   TRIG 105 08/16/2020     --------------------------------------------------------------------------------------------------  ASSESSMENT AND PLAN: Stroke: Mr. Dominica Severin suffered acute left-sided stroke while traveling in [place] last month.  He does not have any residual deficits.  Work-up in [place] included brain  MRI and possible echocardiogram.  I am unable to review all of his records, as the flash drive appears to be password protected.  He is currently awaiting neurology consultation in [place].  I recommend that we obtain a 14-day event monitor, carotid Dopplers, and transthoracic echocardiogram with bubble study to further assess for potential causes of his stroke.  Given that his MRI showed evidence of bilateral cerebral infarcts (acute on the left and more remote on the right), my suspicion for cardioembolic phenomenon is high.  If the aforementioned work-up is unrevealing, we will need to consider referral to EP for loop recorder placement +/- TEE.  In the meantime, I have recommended that Mr. Dominica Severin continue aspirin and clopidogrel as well as high intensity statin therapy.  Hypertension: Blood pressure reasonably well controlled today.  Continue amlodipine 5 mg daily.  Hyperlipidemia: LDL noted to be quite  elevated at 170 on last check in March.  It is uncertain what his LDL may have been at the time of his stroke, as labs are not available for review from [place].  Mr. Dominica Severin should continue atorvastatin 80 mg daily with ongoing follow-up through his PCP.  Target LDL less than 70.  Follow-up: Return to clinic in 6 weeks.  Nelva Bush, MD 10/24/2020 3:03 PM

## 2020-10-24 NOTE — Patient Instructions (Addendum)
Medication Instructions:  Your physician recommends that you continue on your current medications as directed. Please refer to the Current Medication list given to you today.  *If you need a refill on your cardiac medications before your next appointment, please call your pharmacy*   Lab Work: None ordered If you have labs (blood work) drawn today and your tests are completely normal, you will receive your results only by: Marland Kitchen MyChart Message (if you have MyChart) OR . A paper copy in the mail If you have any lab test that is abnormal or we need to change your treatment, we will call you to review the results.   Testing/Procedures:  Your physician has requested that you have an echocardiogram with bubble study. Echocardiography is a painless test that uses sound waves to create images of your heart. It provides your doctor with information about the size and shape of your heart and how well your heart's chambers and valves are working. This procedure takes approximately one hour. There are no restrictions for this procedure.  Your physician has requested that you have a carotid duplex. This test is an ultrasound of the carotid arteries in your neck. It looks at blood flow through these arteries that supply the brain with blood. Allow one hour for this exam. There are no restrictions or special instructions.  Your physician has recommended that you wear a Zio monitor XT (To be worn for 14 days).   This monitor is a medical device that records the heart's electrical activity. Doctors most often use these monitors to diagnose arrhythmias. Arrhythmias are problems with the speed or rhythm of the heartbeat. The monitor is a small device applied to your chest. You can wear one while you do your normal daily activities. While wearing this monitor if you have any symptoms to push the button and record what you felt. Once you have worn this monitor for the period of time provider prescribed (Usually 14  days), you will return the monitor device in the postage paid box. Once it is returned they will download the data collected and provide [place] with a report which the provider will then review and we will call you with those results. Important tips:  1. Avoid showering during the first 24 hours of wearing the monitor. 2. Avoid excessive sweating to help maximize wear time. 3. Do not submerge the device, no hot tubs, and no swimming pools. 4. Keep any lotions or oils away from the patch. 5. After 24 hours you may shower with the patch on. Take brief showers with your back facing the shower head.  6. Do not remove patch once it has been placed because that will interrupt data and decrease adhesive wear time. 7. Push the button when you have any symptoms and write down what you were feeling. 8. Once you have completed wearing your monitor, remove and place into box which has postage paid and place in your outgoing mailbox.  9. If for some reason you have misplaced your box then call our office and we can provide another box and/or mail it off for you.         Follow-Up: At [facility], you and your health needs are our priority.  As part of our continuing mission to provide you with exceptional heart care, we have created designated Provider Care Teams.  These Care Teams include your primary Cardiologist (physician) and Advanced Practice Providers (APPs -  Physician Assistants and Nurse Practitioners) who all work together to provide  you with the care you need, when you need it.  We recommend signing up for the patient portal called "MyChart".  Sign up information is provided on this After Visit Summary.  MyChart is used to connect with patients for Virtual Visits (Telemedicine).  Patients are able to view lab/test results, encounter notes, upcoming appointments, etc.  Non-urgent messages can be sent to your provider as well.   To learn more about what you can do with MyChart, go to  [email or web address].    Your next appointment:   6 week(s)  The format for your next appointment:   In Person  Provider:   You may see Nelva Bush, MD or one of the following Advanced Practice Providers on your designated Care Team:    Murray Hodgkins, NP  Christell Faith, PA-C  Marrianne Mood, PA-C  Cadence Dungannon, [place]  Laurann Montana, NP    Other Instructions N/A

## 2020-10-25 ENCOUNTER — Encounter: Payer: Self-pay | Admitting: Internal Medicine

## 2020-10-25 DIAGNOSIS — I639 Cerebral infarction, unspecified: Secondary | ICD-10-CM

## 2020-10-25 HISTORY — DX: Cerebral infarction, unspecified: I63.9

## 2020-10-29 ENCOUNTER — Telehealth: Payer: Self-pay

## 2020-10-29 NOTE — Telephone Encounter (Signed)
Copied from [place] [id number]. Topic: Referral - Status >> Oct 29, 2020  9:18 AM Yvette Rack wrote: Reason for CRM: Pt stated he has yet to hear from the neurologist office regarding the referral that was placed.

## 2020-11-05 ENCOUNTER — Other Ambulatory Visit: Payer: Self-pay

## 2020-11-05 ENCOUNTER — Ambulatory Visit (INDEPENDENT_AMBULATORY_CARE_PROVIDER_SITE_OTHER): Payer: Medicare Other | Admitting: Family Medicine

## 2020-11-05 ENCOUNTER — Encounter: Payer: Self-pay | Admitting: Family Medicine

## 2020-11-05 VITALS — BP 118/76 | HR 51 | Temp 98.2°F | Ht 75.0 in | Wt 257.0 lb

## 2020-11-05 DIAGNOSIS — E785 Hyperlipidemia, unspecified: Secondary | ICD-10-CM | POA: Diagnosis not present

## 2020-11-05 DIAGNOSIS — E782 Mixed hyperlipidemia: Secondary | ICD-10-CM | POA: Diagnosis not present

## 2020-11-05 DIAGNOSIS — I6389 Other cerebral infarction: Secondary | ICD-10-CM

## 2020-11-05 DIAGNOSIS — Z8673 Personal history of transient ischemic attack (TIA), and cerebral infarction without residual deficits: Secondary | ICD-10-CM

## 2020-11-05 MED ORDER — CLOPIDOGREL BISULFATE 75 MG PO TABS: 75.0000 mg | ORAL_TABLET | Freq: Every day | ORAL | 2 refills | Status: DC

## 2020-11-05 NOTE — Progress Notes (Signed)
BP 118/76   Pulse (!) 51   Temp 98.2 F (36.8 C)   Ht $R'6\' 3"'SR$  (1.905 m)   Wt 257 lb (116.6 kg)   SpO2 96%   BMI 32.12 kg/m    Subjective:    Patient ID: Zachary Rogers., male    DOB: 03-21-1951, 70 y.o.   MRN: [id number]  HPI: Zachary Rogers is a 70 y.o. male  Chief Complaint  Patient presents with   follow up stroke    Patient states he recently saw cardiology and has not been able to see neurology because they scheduled him 4 weeks out. Would also like to know will he be on a blood thinner longer.   HYPERLIPIDEMIA Hyperlipidemia status: excellent compliance Satisfied with current treatment?  yes Side effects:  no Medication compliance: excellent compliance Past cholesterol meds: atorvastatin Supplements: fish oil Aspirin:  yes The ASCVD Risk score Mikey Bussing DC Jr., et al., 2013) failed to calculate for the following reasons:   The patient has a prior MI or stroke diagnosis Chest pain:  no Coronary artery disease:  yes  Relevant past medical, surgical, family and social history reviewed and updated as indicated. Interim medical history since our last visit reviewed. Allergies and medications reviewed and updated.  Review of Systems  Constitutional: Negative.   Respiratory: Negative.    Cardiovascular: Negative.   Gastrointestinal: Negative.   Musculoskeletal: Negative.   Neurological: Negative.   Psychiatric/Behavioral: Negative.     Per HPI unless specifically indicated above     Objective:    BP 118/76   Pulse (!) 51   Temp 98.2 F (36.8 C)   Ht $R'6\' 3"'WC$  (1.905 m)   Wt 257 lb (116.6 kg)   SpO2 96%   BMI 32.12 kg/m   Wt Readings from Last 3 Encounters:  11/05/20 257 lb (116.6 kg)  10/24/20 262 lb (118.8 kg)  09/05/20 260 lb (117.9 kg)    Physical Exam Vitals and nursing note reviewed.  Constitutional:      General: He is not in acute distress.    Appearance: Normal appearance. He is not ill-appearing, toxic-appearing or diaphoretic.  HENT:      Head: Normocephalic and atraumatic.     Right Ear: External ear normal.     Left Ear: External ear normal.     Nose: Nose normal.     Mouth/Throat:     Mouth: Mucous membranes are moist.     Pharynx: Oropharynx is clear.  Eyes:     General: No scleral icterus.       Right eye: No discharge.        Left eye: No discharge.     Extraocular Movements: Extraocular movements intact.     Conjunctiva/sclera: Conjunctivae normal.     Pupils: Pupils are equal, round, and reactive to light.  Cardiovascular:     Rate and Rhythm: Normal rate and regular rhythm.     Pulses: Normal pulses.     Heart sounds: Normal heart sounds. No murmur heard.   No friction rub. No gallop.  Pulmonary:     Effort: Pulmonary effort is normal. No respiratory distress.     Breath sounds: Normal breath sounds. No stridor. No wheezing, rhonchi or rales.  Chest:     Chest wall: No tenderness.  Musculoskeletal:        General: Normal range of motion.     Cervical back: Normal range of motion and neck supple.  Skin:    General:  Skin is warm and dry.     Capillary Refill: Capillary refill takes less than 2 seconds.     Coloration: Skin is not jaundiced or pale.     Findings: No bruising, erythema, lesion or rash.  Neurological:     General: No focal deficit present.     Mental Status: He is alert and oriented to person, place, and time. Mental status is at baseline.  Psychiatric:        Mood and Affect: Mood normal.        Behavior: Behavior normal.        Thought Content: Thought content normal.        Judgment: Judgment normal.    Results for orders placed or performed in visit on 10/22/20  CBC with Differential/Platelet  Result Value Ref Range   WBC 6.4 3.4 - 10.8 x10E3/uL   RBC 5.09 4.14 - 5.80 x10E6/uL   Hemoglobin 15.7 13.0 - 17.7 g/dL   Hematocrit 44.6 37.5 - 51.0 %   MCV 88 79 - 97 fL   MCH 30.8 26.6 - 33.0 pg   MCHC 35.2 31.5 - 35.7 g/dL   RDW 13.3 11.6 - 15.4 %   Platelets 199 150 - 450  x10E3/uL   Neutrophils 65 Not Estab. %   Lymphs 20 Not Estab. %   Monocytes 9 Not Estab. %   Eos 5 Not Estab. %   Basos 1 Not Estab. %   Neutrophils Absolute 4.2 1.4 - 7.0 x10E3/uL   Lymphocytes Absolute 1.3 0.7 - 3.1 x10E3/uL   Monocytes Absolute 0.6 0.1 - 0.9 x10E3/uL   EOS (ABSOLUTE) 0.3 0.0 - 0.4 x10E3/uL   Basophils Absolute 0.1 0.0 - 0.2 x10E3/uL   Immature Granulocytes 0 Not Estab. %   Immature Grans (Abs) 0.0 0.0 - 0.1 Q94H0/TU  Basic metabolic panel  Result Value Ref Range   Glucose 97 65 - 99 mg/dL   BUN 15 8 - 27 mg/dL   Creatinine, Ser 1.31 (H) 0.76 - 1.27 mg/dL   eGFR 58 (L) >59 mL/min/1.73   BUN/Creatinine Ratio 11 10 - 24   Sodium 141 134 - 144 mmol/L   Potassium 4.0 3.5 - 5.2 mmol/L   Chloride 105 96 - 106 mmol/L   CO2 22 20 - 29 mmol/L   Calcium 9.4 8.6 - 10.2 mg/dL      Assessment & Plan:   Problem List Items Addressed This Visit       Other   Hyperlipidemia LDL goal <70    Rechecking labs now. Await results. Treat as needed. Call with any concerns.        History of stroke    Continues to follow with cardiology. Has an appointment with neurology in July. No further symptoms. Call with any concerns. Continue to monitor.        Other Visit Diagnoses     Mixed hyperlipidemia    -  Primary   Relevant Orders   Lipid Panel w/o Chol/HDL Ratio   Comprehensive metabolic panel        Follow up plan: Return in about 6 weeks (around 12/17/2020).

## 2020-11-05 NOTE — Assessment & Plan Note (Signed)
Continues to follow with cardiology. Has an appointment with neurology in July. No further symptoms. Call with any concerns. Continue to monitor.

## 2020-11-05 NOTE — Assessment & Plan Note (Signed)
Rechecking labs now. Await results. Treat as needed. Call with any concerns.

## 2020-11-06 ENCOUNTER — Ambulatory Visit (HOSPITAL_COMMUNITY)
Admission: RE | Admit: 2020-11-06 | Discharge: 2020-11-06 | Disposition: A | Discharge status: Home / Self Care | Payer: Medicare Other | Source: Ambulatory Visit | Attending: Internal Medicine | Admitting: Internal Medicine

## 2020-11-06 ENCOUNTER — Other Ambulatory Visit (HOSPITAL_COMMUNITY): Payer: Medicare Other

## 2020-11-06 DIAGNOSIS — E785 Hyperlipidemia, unspecified: Secondary | ICD-10-CM | POA: Diagnosis not present

## 2020-11-06 DIAGNOSIS — I10 Essential (primary) hypertension: Secondary | ICD-10-CM | POA: Diagnosis not present

## 2020-11-06 DIAGNOSIS — I6789 Other cerebrovascular disease: Secondary | ICD-10-CM | POA: Diagnosis not present

## 2020-11-06 DIAGNOSIS — I639 Cerebral infarction, unspecified: Secondary | ICD-10-CM | POA: Insufficient documentation

## 2020-11-06 LAB — COMPREHENSIVE METABOLIC PANEL
ALT: 39 IU/L (ref 0–44)
AST: 28 IU/L (ref 0–40)
Albumin/Globulin Ratio: 1.8 (ref 1.2–2.2)
Albumin: 4.4 g/dL (ref 3.7–4.7)
Alkaline Phosphatase: 87 IU/L (ref 44–121)
BUN/Creatinine Ratio: 11 (ref 10–24)
BUN: 14 mg/dL (ref 8–27)
Bilirubin Total: 1.7 mg/dL — ABNORMAL HIGH (ref 0.0–1.2)
CO2: 21 mmol/L (ref 20–29)
Calcium: 9.4 mg/dL (ref 8.6–10.2)
Chloride: 104 mmol/L (ref 96–106)
Creatinine, Ser: 1.31 mg/dL — ABNORMAL HIGH (ref 0.76–1.27)
Globulin, Total: 2.4 g/dL (ref 1.5–4.5)
Glucose: 91 mg/dL (ref 65–99)
Potassium: 4.1 mmol/L (ref 3.5–5.2)
Sodium: 142 mmol/L (ref 134–144)
Total Protein: 6.8 g/dL (ref 6.0–8.5)
eGFR: 58 mL/min/{1.73_m2} — ABNORMAL LOW (ref >59)

## 2020-11-06 LAB — LIPID PANEL W/O CHOL/HDL RATIO
Cholesterol, Total: 114 mg/dL (ref 100–199)
HDL: 69 mg/dL (ref >39)
LDL Chol Calc (NIH): 32 mg/dL (ref 0–99)
Triglycerides: 56 mg/dL (ref 0–149)
VLDL Cholesterol Cal: 13 mg/dL (ref 5–40)

## 2020-11-06 LAB — ECHOCARDIOGRAM COMPLETE BUBBLE STUDY
Area-P 1/2: 1.94 cm2
S' Lateral: 4.3 cm

## 2020-11-07 ENCOUNTER — Telehealth: Payer: Self-pay | Admitting: *Deleted

## 2020-11-07 DIAGNOSIS — I639 Cerebral infarction, unspecified: Secondary | ICD-10-CM

## 2020-11-07 DIAGNOSIS — I6389 Other cerebral infarction: Secondary | ICD-10-CM | POA: Diagnosis not present

## 2020-11-07 NOTE — Telephone Encounter (Signed)
Attempted to call pt to discuss echo results and provider's recc below.  No answer at this time. Lmtcb.

## 2020-11-07 NOTE — Telephone Encounter (Signed)
-----   Message from Nelva Bush, MD sent at 11/07/2020  8:55 AM EDT ----- Please let Zachary Rogers know that his echocardiogram showed that his heart is contracting well.  No significant valvular abnormality was identified.  Bubble study suggest that there is a patent foramen ovale, which is a connection between the upper chambers of the heart.  This can sometimes predispose to strokes.  I recommend that we continue with ambulatory cardiac monitoring and follow-up as scheduled next month to review the results in further detail and discuss need for additional testing such as transesophageal echocardiogram.

## 2020-11-08 NOTE — Telephone Encounter (Signed)
Patient calling to discuss recent testing results  ° °Please call  ° °

## 2020-11-08 NOTE — Telephone Encounter (Signed)
Spoke to pt, notified of echo results and provider's recc below.  Pt voiced understanding. He mailed zio monitor back yesterday.  Pt will follow up as scheduled 12/07/20.  Pt has no further questions at this time.

## 2020-11-12 DIAGNOSIS — I6389 Other cerebral infarction: Secondary | ICD-10-CM | POA: Diagnosis not present

## 2020-11-12 DIAGNOSIS — I639 Cerebral infarction, unspecified: Secondary | ICD-10-CM | POA: Diagnosis not present

## 2020-11-13 ENCOUNTER — Other Ambulatory Visit: Payer: Self-pay

## 2020-11-13 ENCOUNTER — Ambulatory Visit (HOSPITAL_COMMUNITY)
Admission: RE | Admit: 2020-11-13 | Discharge: 2020-11-13 | Disposition: A | Discharge status: Home / Self Care | Payer: Medicare Other | Source: Ambulatory Visit | Attending: Internal Medicine | Admitting: Internal Medicine

## 2020-11-13 DIAGNOSIS — I639 Cerebral infarction, unspecified: Secondary | ICD-10-CM

## 2020-11-26 ENCOUNTER — Telehealth: Payer: Self-pay | Admitting: *Deleted

## 2020-11-26 DIAGNOSIS — R9439 Abnormal result of other cardiovascular function study: Secondary | ICD-10-CM

## 2020-11-26 NOTE — Telephone Encounter (Signed)
-----   Message from Nelva Bush, MD sent at 11/16/2020 12:50 PM EDT ----- Please let Zachary Rogers know that preliminary results from his carotid Doppler show mild plaque buildup and narrowing of the right internal carotid artery.  Some abnormal flow in the vertebral and right subclavian arteries was also noted.  I recommend that we obtain a CTA of the head and neck to further evaluate his right subclavian and vertebral arteries.  We will follow-up with him after completion of event monitor as well.

## 2020-11-26 NOTE — Telephone Encounter (Signed)
Attempted to call pt to review results and provider's recc.  No answer at this time. Lmtcb.

## 2020-11-28 NOTE — Telephone Encounter (Signed)
Spoke to pt. Notified of results and provider's recc.  Pt has been scheduled for CTA of head and neck tomorrow morning 11/29/20 at 10:00 AM.  Location will be [facility] on [address].  Pt aware to arrive at 9:30 AM. Instructions given to only have liquids 4 hours prior to CT.  Pt verified no contrast allergy.  Precert team confirmed pt's insurance does not require prior auth.  I have posted appointment information to MyChart per pt request.   Pt has no further questions at this time.  Pt will follow up as scheduled 12/07/20 with Dr. Saunders Revel.

## 2020-11-29 ENCOUNTER — Other Ambulatory Visit: Payer: Self-pay

## 2020-11-29 ENCOUNTER — Ambulatory Visit
Admission: RE | Admit: 2020-11-29 | Discharge: 2020-11-29 | Disposition: A | Discharge status: Home / Self Care | Payer: Medicare Other | Source: Ambulatory Visit | Attending: Internal Medicine | Admitting: Internal Medicine

## 2020-11-29 DIAGNOSIS — I63233 Cerebral infarction due to unspecified occlusion or stenosis of bilateral carotid arteries: Secondary | ICD-10-CM | POA: Diagnosis not present

## 2020-11-29 DIAGNOSIS — R9439 Abnormal result of other cardiovascular function study: Secondary | ICD-10-CM | POA: Insufficient documentation

## 2020-11-29 DIAGNOSIS — M47812 Spondylosis without myelopathy or radiculopathy, cervical region: Secondary | ICD-10-CM | POA: Diagnosis not present

## 2020-11-29 DIAGNOSIS — Z8679 Personal history of other diseases of the circulatory system: Secondary | ICD-10-CM | POA: Diagnosis not present

## 2020-11-29 LAB — POCT I-STAT CREATININE: Creatinine, Ser: 1 mg/dL (ref 0.61–1.24)

## 2020-11-29 MED ORDER — IOHEXOL 350 MG/ML SOLN
75.0000 mL | Freq: Once | INTRAVENOUS | Status: AC | PRN
  Administered 2020-11-29: 75 mL via INTRAVENOUS

## 2020-11-30 ENCOUNTER — Other Ambulatory Visit: Payer: Self-pay | Admitting: Neurology

## 2020-11-30 DIAGNOSIS — Z8673 Personal history of transient ischemic attack (TIA), and cerebral infarction without residual deficits: Secondary | ICD-10-CM | POA: Diagnosis not present

## 2020-11-30 DIAGNOSIS — Q211 Atrial septal defect: Secondary | ICD-10-CM | POA: Diagnosis not present

## 2020-11-30 DIAGNOSIS — I639 Cerebral infarction, unspecified: Secondary | ICD-10-CM | POA: Diagnosis not present

## 2020-12-03 ENCOUNTER — Telehealth: Payer: Self-pay

## 2020-12-03 NOTE — Telephone Encounter (Signed)
Left detail message on VM of pt's recent results okay by DPR, Dr. END advised   "Please let Zachary Rogers know that his CTA head/neck shows evidence of prior stroke but no significant narrowing in the arteries.  Zachary Rogers should continue hiscurrent medications and follow-up as previously recommended"  At this time, no further recommendations or medications changes, advised to call office for any concerns or questions, otherwise will see at next visit on 7/22.  Results released to MyChart for review

## 2020-12-04 ENCOUNTER — Other Ambulatory Visit: Payer: Self-pay

## 2020-12-04 ENCOUNTER — Ambulatory Visit
Admission: RE | Admit: 2020-12-04 | Discharge: 2020-12-04 | Disposition: A | Discharge status: Home / Self Care | Payer: Medicare Other | Source: Ambulatory Visit | Attending: Neurology | Admitting: Neurology

## 2020-12-04 DIAGNOSIS — I639 Cerebral infarction, unspecified: Secondary | ICD-10-CM | POA: Diagnosis not present

## 2020-12-04 DIAGNOSIS — Z8673 Personal history of transient ischemic attack (TIA), and cerebral infarction without residual deficits: Secondary | ICD-10-CM | POA: Insufficient documentation

## 2020-12-04 DIAGNOSIS — G319 Degenerative disease of nervous system, unspecified: Secondary | ICD-10-CM | POA: Diagnosis not present

## 2020-12-04 DIAGNOSIS — J019 Acute sinusitis, unspecified: Secondary | ICD-10-CM | POA: Diagnosis not present

## 2020-12-07 ENCOUNTER — Other Ambulatory Visit: Payer: Medicare Other

## 2020-12-07 ENCOUNTER — Ambulatory Visit (INDEPENDENT_AMBULATORY_CARE_PROVIDER_SITE_OTHER): Payer: Medicare Other | Admitting: Internal Medicine

## 2020-12-07 ENCOUNTER — Telehealth: Payer: Self-pay | Admitting: *Deleted

## 2020-12-07 ENCOUNTER — Encounter: Payer: Self-pay | Admitting: Internal Medicine

## 2020-12-07 ENCOUNTER — Other Ambulatory Visit: Payer: Self-pay

## 2020-12-07 VITALS — BP 120/74 | HR 88 | Ht 75.0 in | Wt 259.0 lb

## 2020-12-07 DIAGNOSIS — I639 Cerebral infarction, unspecified: Secondary | ICD-10-CM | POA: Diagnosis not present

## 2020-12-07 DIAGNOSIS — Z01812 Encounter for preprocedural laboratory examination: Secondary | ICD-10-CM

## 2020-12-07 DIAGNOSIS — Q2112 Patent foramen ovale: Secondary | ICD-10-CM

## 2020-12-07 DIAGNOSIS — Q211 Atrial septal defect: Secondary | ICD-10-CM | POA: Diagnosis not present

## 2020-12-07 NOTE — Progress Notes (Signed)
Follow-up Outpatient Visit Date: 12/07/2020  Primary Care Provider: Valerie Roys, DO [address] [place] [address]  Chief Complaint: Follow-up testing after stroke  HPI:  Zachary Rogers is a 70 y.o. male with history of history of stroke while traveling in [place] earlier this year, hypertension, hyperlipidemia, and nephrolithiasis, who presents for follow-up of stroke.  I met Mr Dominica Rogers in early June for urgent evaluation of possible cardioembolic stroke that occurred while he was on a cruise in [place].  Subsequent echo revealed sizable right to left shunt concerning for PFO.  Carotid Doppler showed mild right carotid artery stenosis as well as atypical flow in both vertebral arteries and flow disturbance in the right subclavian artery.  CTA of the head and neck showed no significant vascular abnormality.  14-day event monitor showed occasional PACs and rare PVCs as well as brief runs of PSVT and idioventricular rhythm.  No atrial fibrillation/flutter was observed.  Today, Zachary Rogers reports that he has been feeling well.  He has no residual deficits from his stroke.  He has not experienced any new focal neurologic changes.  He also denies chest pain, shortness of breath, palpitations, lightheadedness, or edema.  He underwent brain MRI 2 days ago ordered by Dr. Melrose Nakayama and has reviewed the results through [facility] but has not discussed them with Dr. Melrose Nakayama  --------------------------------------------------------------------------------------------------  Cardiovascular History & Procedures: Cardiovascular Problems: Stroke Patent foramen ovale   Risk Factors: Hypertension, hyperlipidemia, male gender, tobacco use, obesity, and age greater than 55   Cath/PCI: None   CV Surgery: None   EP Procedures and Devices: 14-day event monitor (11/14/2020): Predominantly sinus rhythm with occasional PACs and rare PVCs as well as several brief runs of PSVT.  At least one episode of  idioventricular rhythm was also noted.  No atrial fibrillation/flutter observed.   Non-Invasive Evaluation(s): Carotid Doppler (11/13/2020): Mild right ICA stenosis.  Normal left carotid artery.  Atypical antegrade flow in both vertebral arteries.  Flow disturbance in right subclavian artery noted. TTE (11/06/2020): Mildly dilated LV with normal wall thickness.  Normal LVEF (50-55%) with normal wall motion.  Indeterminate diastolic parameters.  Normal RV size and function.  Normal biatrial size.  Trivial mitral and tricuspid regurgitation.  Positive bubble study.    Recent CV Pertinent Labs: Lab Results  Component Value Date   CHOL 114 11/05/2020   HDL 69 11/05/2020   LDLCALC 32 11/05/2020   TRIG 56 11/05/2020   K 4.1 11/05/2020   BUN 14 11/05/2020   CREATININE 1.00 11/29/2020    Past medical and surgical history were reviewed and updated in EPIC.  Current Meds  Medication Sig   amLODipine (NORVASC) 5 MG tablet Take 1 tablet (5 mg total) by mouth daily.   Ascorbic Acid (VITAMIN C) 1000 MG tablet Take 1,000 mg by mouth daily.   aspirin 81 MG EC tablet Take 1 tablet by mouth daily.   atorvastatin (LIPITOR) 80 MG tablet Take 1 tablet (80 mg total) by mouth daily.   Cholecalciferol (VITAMIN D3) 3000 units TABS Take 1,000 Units by mouth daily.   clopidogrel (PLAVIX) 75 MG tablet Take 1 tablet (75 mg total) by mouth daily.   Coenzyme Q10 (CO Q 10 PO) Take by mouth daily.   Cyanocobalamin (VITAMIN B 12 PO) Take 3,000 mcg by mouth daily.   Glucosamine-Chondroit-Vit C-Mn (GLUCOSAMINE 1500 COMPLEX PO) Take 1,500 mg by mouth 2 (two) times daily.   Methylsulfonylmethane (MSM) 1500 MG TABS Take 1,500 mg by mouth  2 (two) times daily.   Omega-3 Fatty Acids (FISH OIL) 1200 MG CAPS Take 1,200 mg by mouth 2 (two) times daily.   Potassium 99 MG TABS Take 1 tablet by mouth daily.   Zinc Sulfate (ZINC 15 PO) Take 15 mg by mouth 4 (four) times a week.    Allergies: Patient has no known  allergies.  Social History   Tobacco Use   Smoking status: Former    Packs/day: 0.00    Years: 0.00    Pack years: 0.00    Types: Cigarettes    Quit date: 2000    Years since quitting: 22.5   Smokeless tobacco: Never   Tobacco comments:    social smoker when younger   Vaping Use   Vaping Use: Never used  Substance Use Topics   Alcohol use: Yes    Alcohol/week: 5.0 standard drinks    Types: 5 Glasses of wine per week   Drug use: No    Family History  Problem Relation Age of Onset   Lung cancer Mother    Hypertension Father    Diabetes Father    Transient ischemic attack Father    Heart attack Maternal Grandfather    Prostate cancer Neg Hx    Bladder Cancer Neg Hx    Kidney cancer Neg Hx     Review of Systems: A 12-system review of systems was performed and was negative except as noted in the HPI.  --------------------------------------------------------------------------------------------------  Physical Exam: BP 120/74   Pulse 88   Ht _0  (1.905 m)   Wt 259 lb (117.5 kg)   SpO2 97%   BMI 32.37 kg/m   General:  NAD. Neck: No JVD or HJR. Lungs: Clear to auscultation bilaterally without wheezes or crackles. Heart: Regular rate and rhythm without murmurs, rubs, or gallops. Abdomen: Soft, nontender, nondistended. Extremities: No lower extremity edema.  EKG: Sinus bradycardia with PACs and left axis deviation.  PACs are new since 10/24/2020.  Otherwise, no significant interval change.  Lab Results  Component Value Date   WBC 6.4 10/22/2020   HGB 15.7 10/22/2020   HCT 44.6 10/22/2020   MCV 88 10/22/2020   PLT 199 10/22/2020    Lab Results  Component Value Date   NA 142 11/05/2020   K 4.1 11/05/2020   CL 104 11/05/2020   CO2 21 11/05/2020   BUN 14 11/05/2020   CREATININE 1.00 11/29/2020   GLUCOSE 91 11/05/2020   ALT 39 11/05/2020    Lab Results  Component Value Date   CHOL 114 11/05/2020   HDL 69 11/05/2020   LDLCALC 32 11/05/2020   TRIG 56  11/05/2020    --------------------------------------------------------------------------------------------------  ASSESSMENT AND PLAN: Patent foramen ovale and cryptogenic stroke: Zachary Rogers has recovered well from his stroke earlier this year while on a cruise in [place].  He has not had any new focal neurologic deficits.  We have reviewed his testing, notable for right to left intracardiac shunt suspicious for PFO.  Event monitor showed some supraventricular and ventricular ectopy without atrial fibrillation.  Carotid Dopplers raised potential for right subclavian and vertebral artery flow disturbances, though no significant vascular abnormality was identified on subsequent CTA head and neck.  I have reviewed his brain MRI from 2 days ago which demonstrates multifocal prior infarcts.  This certainly raises the possibility for cardioembolic events.  I have recommended that we proceed with TEE to better evaluate his PFO and exclude other potential causes for cardioembolic events.  If PFO is  confirmed, we will refer Zachary Rogers to Dr. Burt Knack to discuss closure.  For now, he should continue his current medications including dual antiplatelet therapy with aspirin and clopidogrel as directed by neurology.  Hyperlipidemia: Lipids well controlled on last check in June.  Continue atorvastatin.  Shared Decision Making/Informed Consent The risks [esophageal damage, perforation (1:10,000 risk), bleeding, pharyngeal hematoma as well as other potential complications associated with conscious sedation including aspiration, arrhythmia, respiratory failure and death], benefits (treatment guidance and diagnostic support) and alternatives of a transesophageal echocardiogram were discussed in detail with Zachary Rogers and he is willing to proceed.   Follow-up: Return to clinic in 3 months.  Nelva Bush, MD 12/07/2020 8:04 AM

## 2020-12-07 NOTE — H&P (View-Only) (Signed)
Follow-up Outpatient Visit Date: 12/07/2020  Primary Care Provider: Valerie Roys, DO [address] [place] [address]  Chief Complaint: Follow-up testing after stroke  HPI:  [name] is a 70 y.o. male with history of history of stroke while traveling in [place] earlier this year, hypertension, hyperlipidemia, and nephrolithiasis, who presents for follow-up of stroke.  I met Mr Zachary Rogers in early June for urgent evaluation of possible cardioembolic stroke that occurred while he was on a cruise in [place].  Subsequent echo revealed sizable right to left shunt concerning for PFO.  Carotid Doppler showed mild right carotid artery stenosis as well as atypical flow in both vertebral arteries and flow disturbance in the right subclavian artery.  CTA of the head and neck showed no significant vascular abnormality.  14-day event monitor showed occasional PACs and rare PVCs as well as brief runs of PSVT and idioventricular rhythm.  No atrial fibrillation/flutter was observed.  Today, [name] reports that he has been feeling well.  He has no residual deficits from his stroke.  He has not experienced any new focal neurologic changes.  He also denies chest pain, shortness of breath, palpitations, lightheadedness, or edema.  He underwent brain MRI 2 days ago ordered by Dr. Melrose Rogers and has reviewed the results through [facility] but has not discussed them with Dr. Melrose Rogers  --------------------------------------------------------------------------------------------------  Cardiovascular History & Procedures: Cardiovascular Problems: Stroke Patent foramen ovale   Risk Factors: Hypertension, hyperlipidemia, male gender, tobacco use, obesity, and age greater than 55   Cath/PCI: None   CV Surgery: None   EP Procedures and Devices: 14-day event monitor (11/14/2020): Predominantly sinus rhythm with occasional PACs and rare PVCs as well as several brief runs of PSVT.  At least one episode of  idioventricular rhythm was also noted.  No atrial fibrillation/flutter observed.   Non-Invasive Evaluation(s): Carotid Doppler (11/13/2020): Mild right ICA stenosis.  Normal left carotid artery.  Atypical antegrade flow in both vertebral arteries.  Flow disturbance in right subclavian artery noted. TTE (11/06/2020): Mildly dilated LV with normal wall thickness.  Normal LVEF (50-55%) with normal wall motion.  Indeterminate diastolic parameters.  Normal RV size and function.  Normal biatrial size.  Trivial mitral and tricuspid regurgitation.  Positive bubble study.    Recent CV Pertinent Labs: Lab Results  Component Value Date   CHOL 114 11/05/2020   HDL 69 11/05/2020   LDLCALC 32 11/05/2020   TRIG 56 11/05/2020   K 4.1 11/05/2020   BUN 14 11/05/2020   CREATININE 1.00 11/29/2020    Past medical and surgical history were reviewed and updated in EPIC.  Current Meds  Medication Sig   amLODipine (NORVASC) 5 MG tablet Take 1 tablet (5 mg total) by mouth daily.   Ascorbic Acid (VITAMIN C) 1000 MG tablet Take 1,000 mg by mouth daily.   aspirin 81 MG EC tablet Take 1 tablet by mouth daily.   atorvastatin (LIPITOR) 80 MG tablet Take 1 tablet (80 mg total) by mouth daily.   Cholecalciferol (VITAMIN D3) 3000 units TABS Take 1,000 Units by mouth daily.   clopidogrel (PLAVIX) 75 MG tablet Take 1 tablet (75 mg total) by mouth daily.   Coenzyme Q10 (CO Q 10 PO) Take by mouth daily.   Cyanocobalamin (VITAMIN B 12 PO) Take 3,000 mcg by mouth daily.   Glucosamine-Chondroit-Vit C-Mn (GLUCOSAMINE 1500 COMPLEX PO) Take 1,500 mg by mouth 2 (two) times daily.   Methylsulfonylmethane (MSM) 1500 MG TABS Take 1,500 mg by mouth  2 (two) times daily.   Omega-3 Fatty Acids (FISH OIL) 1200 MG CAPS Take 1,200 mg by mouth 2 (two) times daily.   Potassium 99 MG TABS Take 1 tablet by mouth daily.   Zinc Sulfate (ZINC 15 PO) Take 15 mg by mouth 4 (four) times a week.    Allergies: Patient has no known  allergies.  Social History   Tobacco Use   Smoking status: Former    Packs/day: 0.00    Years: 0.00    Pack years: 0.00    Types: Cigarettes    Quit date: 2000    Years since quitting: 22.5   Smokeless tobacco: Never   Tobacco comments:    social smoker when younger   Vaping Use   Vaping Use: Never used  Substance Use Topics   Alcohol use: Yes    Alcohol/week: 5.0 standard drinks    Types: 5 Glasses of wine per week   Drug use: No    Family History  Problem Relation Age of Onset   Lung cancer Mother    Hypertension Father    Diabetes Father    Transient ischemic attack Father    Heart attack Maternal Grandfather    Prostate cancer Neg Hx    Bladder Cancer Neg Hx    Kidney cancer Neg Hx     Review of Systems: A 12-system review of systems was performed and was negative except as noted in the HPI.  --------------------------------------------------------------------------------------------------  Physical Exam: BP 120/74   Pulse 88   Ht _0  (1.905 m)   Wt 259 lb (117.5 kg)   SpO2 97%   BMI 32.37 kg/m   General:  NAD. Neck: No JVD or HJR. Lungs: Clear to auscultation bilaterally without wheezes or crackles. Heart: Regular rate and rhythm without murmurs, rubs, or gallops. Abdomen: Soft, nontender, nondistended. Extremities: No lower extremity edema.  EKG: Sinus bradycardia with PACs and left axis deviation.  PACs are new since 10/24/2020.  Otherwise, no significant interval change.  Lab Results  Component Value Date   WBC 6.4 10/22/2020   HGB 15.7 10/22/2020   HCT 44.6 10/22/2020   MCV 88 10/22/2020   PLT 199 10/22/2020    Lab Results  Component Value Date   NA 142 11/05/2020   K 4.1 11/05/2020   CL 104 11/05/2020   CO2 21 11/05/2020   BUN 14 11/05/2020   CREATININE 1.00 11/29/2020   GLUCOSE 91 11/05/2020   ALT 39 11/05/2020    Lab Results  Component Value Date   CHOL 114 11/05/2020   HDL 69 11/05/2020   LDLCALC 32 11/05/2020   TRIG 56  11/05/2020    --------------------------------------------------------------------------------------------------  ASSESSMENT AND PLAN: Patent foramen ovale and cryptogenic stroke: [name] has recovered well from his stroke earlier this year while on a cruise in [place].  He has not had any new focal neurologic deficits.  We have reviewed his testing, notable for right to left intracardiac shunt suspicious for PFO.  Event monitor showed some supraventricular and ventricular ectopy without atrial fibrillation.  Carotid Dopplers raised potential for right subclavian and vertebral artery flow disturbances, though no significant vascular abnormality was identified on subsequent CTA head and neck.  I have reviewed his brain MRI from 2 days ago which demonstrates multifocal prior infarcts.  This certainly raises the possibility for cardioembolic events.  I have recommended that we proceed with TEE to better evaluate his PFO and exclude other potential causes for cardioembolic events.  If PFO is  confirmed, we will refer [name] to Dr. Burt Knack to discuss closure.  For now, he should continue his current medications including dual antiplatelet therapy with aspirin and clopidogrel as directed by neurology.  Hyperlipidemia: Lipids well controlled on last check in June.  Continue atorvastatin.  Shared Decision Making/Informed Consent The risks [esophageal damage, perforation (1:10,000 risk), bleeding, pharyngeal hematoma as well as other potential complications associated with conscious sedation including aspiration, arrhythmia, respiratory failure and death], benefits (treatment guidance and diagnostic support) and alternatives of a transesophageal echocardiogram were discussed in detail with [name] and he is willing to proceed.   Follow-up: Return to clinic in 3 months.  Nelva Bush, MD 12/07/2020 8:04 AM

## 2020-12-07 NOTE — Telephone Encounter (Signed)
Called and notified pt of TEE procedure date/time.  Procedure instructions reviewed in office visit today.  TEE scheduled 12/17/20 at 9:30 AM at [facility] with Dr. Johney Frame.  Pt aware to have labs (BMET/ CBC) early to mid next week (1 week prior to procedure) at the [facility]. Lab orders placed.   AVS instructions updated and pt will view on MyChart.  Pt has no further questions at this time.

## 2020-12-07 NOTE — Patient Instructions (Addendum)
Medication Instructions:   Your physician recommends that you continue on your current medications as directed. Please refer to the Current Medication list given to you today.  *If you need a refill on your cardiac medications before your next appointment, please call your pharmacy*   Lab Work:  None ordered today  Testing/Procedures:  Your physician has requested that you have a TEE. During a TEE, sound waves are used to create images of your heart. It provides your doctor with information about the size and shape of your heart and how well your heart's chambers and valves are working. In this test, a transducer is attached to the end of a flexible tube that's guided down your throat and into your esophagus (the tube leading from you mouth to your stomach) to get a more detailed image of your heart. You are not awake for the procedure. Please see the instruction sheet given to you today. For further information please visit [email or web address].  TEE INSTRUCTIONS: You are scheduled for a TEE on Monday 12/17/20 with Dr. Johney Frame.  Please arrive at the [facility] (Main Entrance A) at [facility]: [address] [place], [place] [address] at 8:30 am (1 hour prior to procedure unless lab work is needed; if lab work is needed arrive 1.5 hours ahead). You are allowed ONE visitor in the waiting room during your procedure. Both you and your guest must wear masks.  DIET: Nothing to eat or drink after midnight except a sip of water with medications.  MEDICATION INSTRUCTIONS:    You may take your other medications as directed with sips of water.  LABS: Please have labs completed at the [place] [facility] 1 week prior to procedure (BMET/CBC)       (You may arrive early to mid next week) These Labs are NOT fasting.  -  Please go to the [facility]. You will check in at the front desk to the right as you walk into the atrium. Valet Parking is offered if needed. - No appointment needed.  You may go any day between 7 am and 6 pm.   You must have a responsible person to drive you home and stay in the waiting area during your procedure. Failure to do so could result in cancellation.  Bring your insurance cards.  *Special Note: Every effort is made to have your procedure done on time. Occasionally there are emergencies that occur at the hospital that may cause delays. Please be patient if a delay does occur.   Follow-Up: At [facility], you and your health needs are our priority.  As part of our continuing mission to provide you with exceptional heart care, we have created designated Provider Care Teams.  These Care Teams include your primary Cardiologist (physician) and Advanced Practice Providers (APPs -  Physician Assistants and Nurse Practitioners) who all work together to provide you with the care you need, when you need it.  We recommend signing up for the patient portal called "MyChart".  Sign up information is provided on this After Visit Summary.  MyChart is used to connect with patients for Virtual Visits (Telemedicine).  Patients are able to view lab/test results, encounter notes, upcoming appointments, etc.  Non-urgent messages can be sent to your provider as well.   To learn more about what you can do with MyChart, go to [email or web address].    Your next appointment:   3 month(s)  The format for your next appointment:   In Person  Provider:  Nelva Bush, MD

## 2020-12-11 ENCOUNTER — Other Ambulatory Visit
Admission: RE | Admit: 2020-12-11 | Discharge: 2020-12-11 | Disposition: A | Discharge status: Home / Self Care | Payer: Medicare Other | Attending: Internal Medicine | Admitting: Internal Medicine

## 2020-12-11 DIAGNOSIS — Z01812 Encounter for preprocedural laboratory examination: Secondary | ICD-10-CM | POA: Diagnosis not present

## 2020-12-11 DIAGNOSIS — Q211 Atrial septal defect: Secondary | ICD-10-CM | POA: Diagnosis not present

## 2020-12-11 DIAGNOSIS — Q2112 Patent foramen ovale: Secondary | ICD-10-CM

## 2020-12-11 LAB — BASIC METABOLIC PANEL
Anion gap: 8 (ref 5–15)
BUN: 17 mg/dL (ref 8–23)
CO2: 23 mmol/L (ref 22–32)
Calcium: 9.2 mg/dL (ref 8.9–10.3)
Chloride: 110 mmol/L (ref 98–111)
Creatinine, Ser: 1.1 mg/dL (ref 0.61–1.24)
GFR, Estimated: >60 mL/min (ref >60)
Glucose, Bld: 108 mg/dL — ABNORMAL HIGH (ref 70–99)
Potassium: 4.1 mmol/L (ref 3.5–5.1)
Sodium: 141 mmol/L (ref 135–145)

## 2020-12-11 LAB — CBC
HCT: 39.5 % (ref 39.0–52.0)
Hemoglobin: 14.4 g/dL (ref 13.0–17.0)
MCH: 32.3 pg (ref 26.0–34.0)
MCHC: 36.5 g/dL — ABNORMAL HIGH (ref 30.0–36.0)
MCV: 88.6 fL (ref 80.0–100.0)
Platelets: 167 10*3/uL (ref 150–400)
RBC: 4.46 MIL/uL (ref 4.22–5.81)
RDW: 13.5 % (ref 11.5–15.5)
WBC: 5.7 10*3/uL (ref 4.0–10.5)
nRBC: 0 % (ref 0.0–0.2)

## 2020-12-17 ENCOUNTER — Ambulatory Visit (HOSPITAL_COMMUNITY)
Admission: RE | Admit: 2020-12-17 | Discharge: 2020-12-17 | Disposition: A | Discharge status: Home / Self Care | Payer: Medicare Other | Source: Ambulatory Visit | Attending: Internal Medicine | Admitting: Internal Medicine

## 2020-12-17 ENCOUNTER — Encounter (HOSPITAL_COMMUNITY)
Admission: RE | Disposition: A | Discharge status: Home / Self Care | Payer: Self-pay | Source: Ambulatory Visit | Attending: Internal Medicine

## 2020-12-17 ENCOUNTER — Ambulatory Visit (HOSPITAL_COMMUNITY): Payer: Medicare Other | Admitting: Certified Registered Nurse Anesthetist

## 2020-12-17 ENCOUNTER — Ambulatory Visit: Payer: Medicare Other | Admitting: Family Medicine

## 2020-12-17 ENCOUNTER — Ambulatory Visit (HOSPITAL_BASED_OUTPATIENT_CLINIC_OR_DEPARTMENT_OTHER): Payer: Medicare Other

## 2020-12-17 DIAGNOSIS — Z7902 Long term (current) use of antithrombotics/antiplatelets: Secondary | ICD-10-CM | POA: Insufficient documentation

## 2020-12-17 DIAGNOSIS — Z7982 Long term (current) use of aspirin: Secondary | ICD-10-CM | POA: Insufficient documentation

## 2020-12-17 DIAGNOSIS — I088 Other rheumatic multiple valve diseases: Secondary | ICD-10-CM | POA: Diagnosis not present

## 2020-12-17 DIAGNOSIS — Z8673 Personal history of transient ischemic attack (TIA), and cerebral infarction without residual deficits: Secondary | ICD-10-CM | POA: Diagnosis not present

## 2020-12-17 DIAGNOSIS — I639 Cerebral infarction, unspecified: Secondary | ICD-10-CM | POA: Diagnosis not present

## 2020-12-17 DIAGNOSIS — I10 Essential (primary) hypertension: Secondary | ICD-10-CM | POA: Diagnosis not present

## 2020-12-17 DIAGNOSIS — I340 Nonrheumatic mitral (valve) insufficiency: Secondary | ICD-10-CM

## 2020-12-17 DIAGNOSIS — Q211 Atrial septal defect: Secondary | ICD-10-CM | POA: Insufficient documentation

## 2020-12-17 DIAGNOSIS — Z8249 Family history of ischemic heart disease and other diseases of the circulatory system: Secondary | ICD-10-CM | POA: Insufficient documentation

## 2020-12-17 DIAGNOSIS — E785 Hyperlipidemia, unspecified: Secondary | ICD-10-CM | POA: Diagnosis not present

## 2020-12-17 DIAGNOSIS — Z87891 Personal history of nicotine dependence: Secondary | ICD-10-CM | POA: Diagnosis not present

## 2020-12-17 DIAGNOSIS — Z79899 Other long term (current) drug therapy: Secondary | ICD-10-CM | POA: Insufficient documentation

## 2020-12-17 DIAGNOSIS — I6521 Occlusion and stenosis of right carotid artery: Secondary | ICD-10-CM | POA: Diagnosis not present

## 2020-12-17 DIAGNOSIS — Q2112 Patent foramen ovale: Secondary | ICD-10-CM

## 2020-12-17 DIAGNOSIS — I2510 Atherosclerotic heart disease of native coronary artery without angina pectoris: Secondary | ICD-10-CM | POA: Diagnosis not present

## 2020-12-17 DIAGNOSIS — I700 Atherosclerosis of aorta: Secondary | ICD-10-CM | POA: Diagnosis not present

## 2020-12-17 HISTORY — PX: BUBBLE STUDY: SHX6837

## 2020-12-17 HISTORY — PX: TEE WITHOUT CARDIOVERSION: SHX5443

## 2020-12-17 SURGERY — ECHOCARDIOGRAM, TRANSESOPHAGEAL
Anesthesia: Monitor Anesthesia Care

## 2020-12-17 MED ORDER — SODIUM CHLORIDE 0.9 % IV SOLN: INTRAVENOUS | Status: DC

## 2020-12-17 MED ORDER — LIDOCAINE 2% (20 MG/ML) 5 ML SYRINGE
INTRAMUSCULAR | Status: DC | PRN
  Administered 2020-12-17: 60 mg via INTRAVENOUS

## 2020-12-17 MED ORDER — PROPOFOL 500 MG/50ML IV EMUL
INTRAVENOUS | Status: DC | PRN
  Administered 2020-12-17: 125 ug/kg/min via INTRAVENOUS

## 2020-12-17 MED ORDER — SODIUM CHLORIDE 0.9 % IV SOLN: INTRAVENOUS | Status: DC | PRN

## 2020-12-17 MED ORDER — LACTATED RINGERS IV SOLN
INTRAVENOUS | Status: AC | PRN
  Administered 2020-12-17: 1000 mL via INTRAVENOUS

## 2020-12-17 MED ORDER — LIDOCAINE VISCOUS HCL 2 % MT SOLN
OROMUCOSAL | Status: AC
  Filled 2020-12-17: qty 15

## 2020-12-17 MED ORDER — PROPOFOL 10 MG/ML IV BOLUS
INTRAVENOUS | Status: DC | PRN
  Administered 2020-12-17 (×2): 20 mg via INTRAVENOUS

## 2020-12-17 MED ORDER — PROPOFOL 10 MG/ML IV BOLUS: INTRAVENOUS | Status: DC | PRN

## 2020-12-17 MED ORDER — LIDOCAINE VISCOUS HCL 2 % MT SOLN
OROMUCOSAL | Status: DC | PRN
  Administered 2020-12-17: 10 mL via OROMUCOSAL

## 2020-12-17 NOTE — CV Procedure (Signed)
TEE  Patient for TEE to evaluate for source of CVA  Patient sedated by anesthesia with Propofol intravenously Throat anesthetized with viscous lidocaine Bite guard placed. TEE probe inserted into mouth and advanced to mid esophagus without difficulty  LA, LAA without masses The interatrial septum is hypermobile    There is a prominent PFO as seen by color doppler with L to R flow   Also seen as tested with injection of agitated saline with bubbles seen in L sided chambers.       TV normal  Trace TR MV normal  Mild MR AV normal  Mild AI Pulmonic valve normal  Mild PR   LVEF and RVEF normal Minimal fixed plaquing in aorta      Procedure was without complications     Dorris Carnes MD

## 2020-12-17 NOTE — Transfer of Care (Signed)
Immediate Anesthesia Transfer of Care Note  Patient: Zachary Rogers.  Procedure(s) Performed: TRANSESOPHAGEAL ECHOCARDIOGRAM (TEE) BUBBLE STUDY  Patient Location: Endoscopy Unit  Anesthesia Type:MAC  Level of Consciousness: drowsy, patient cooperative and responds to stimulation  Airway & Oxygen Therapy: Patient Spontanous Breathing and Patient connected to nasal cannula oxygen  Post-op Assessment: Report given to RN and Post -op Vital signs reviewed and stable  Post vital signs: Reviewed and stable  Last Vitals:  Vitals Value Taken Time  BP 160/130 12/17/20 0913  Temp    Pulse 42 12/17/20 0914  Resp 15 12/17/20 0914  SpO2 96 % 12/17/20 0914  Vitals shown include unvalidated device data.  Last Pain:  Vitals:   12/17/20 0812  TempSrc: Oral  PainSc: 0-No pain         Complications: No notable events documented.

## 2020-12-17 NOTE — Anesthesia Preprocedure Evaluation (Addendum)
Anesthesia Evaluation  Patient identified by MRN, date of birth, ID band Patient awake    Reviewed: Allergy & Precautions, H&P , NPO status , Patient's Chart, lab work & pertinent test results  Airway Mallampati: II  TM Distance: >3 FB Neck ROM: Full    Dental no notable dental hx. (+) Teeth Intact, Dental Advisory Given   Pulmonary neg pulmonary ROS, former smoker,    Pulmonary exam normal breath sounds clear to auscultation       Cardiovascular hypertension, Pt. on medications  Rhythm:Regular Rate:Normal     Neuro/Psych CVA, No Residual Symptoms negative psych ROS   GI/Hepatic negative GI ROS, Neg liver ROS,   Endo/Other  negative endocrine ROS  Renal/GU Renal disease  negative genitourinary   Musculoskeletal  (+) Arthritis , Osteoarthritis,    Abdominal   Peds  Hematology negative hematology ROS (+)   Anesthesia Other Findings   Reproductive/Obstetrics negative OB ROS                            Anesthesia Physical Anesthesia Plan  ASA: 2  Anesthesia Plan: MAC   Post-op Pain Management:    Induction: Intravenous  PONV Risk Score and Plan: 1 and Propofol infusion  Airway Management Planned: Nasal Cannula  Additional Equipment:   Intra-op Plan:   Post-operative Plan:   Informed Consent: I have reviewed the patients History and Physical, chart, labs and discussed the procedure including the risks, benefits and alternatives for the proposed anesthesia with the patient or authorized representative who has indicated his/her understanding and acceptance.     Dental advisory given  Plan Discussed with: CRNA  Anesthesia Plan Comments:         Anesthesia Quick Evaluation

## 2020-12-17 NOTE — Discharge Instructions (Signed)

## 2020-12-17 NOTE — Interval H&P Note (Signed)
History and Physical Interval Note:  12/17/2020 8:21 AM  Zachary Rogers.  has presented today for surgery, with the diagnosis of pfo.  The various methods of treatment have been discussed with the patient and family. After consideration of risks, benefits and other options for treatment, the patient has consented to  Procedure(s): TRANSESOPHAGEAL ECHOCARDIOGRAM (TEE) (N/A) as a surgical intervention.  The patient's history has been reviewed, patient examined, no change in status, stable for surgery.  I have reviewed the patient's chart and labs.  Questions were answered to the patient's satisfaction.     Dorris Carnes

## 2020-12-17 NOTE — Anesthesia Postprocedure Evaluation (Signed)
Anesthesia Post Note  Patient: Zachary Rogers.  Procedure(s) Performed: TRANSESOPHAGEAL ECHOCARDIOGRAM (TEE) BUBBLE STUDY     Patient location during evaluation: Endoscopy Anesthesia Type: MAC Level of consciousness: awake and alert Pain management: pain level controlled Vital Signs Assessment: post-procedure vital signs reviewed and stable Respiratory status: spontaneous breathing, nonlabored ventilation and respiratory function stable Cardiovascular status: stable and blood pressure returned to baseline Postop Assessment: no apparent nausea or vomiting Anesthetic complications: no   No notable events documented.  Last Vitals:  Vitals:   12/17/20 0923 12/17/20 0933  BP: (!) 110/51 (!) 126/99  Pulse: (!) 46 (!) 47  Resp: 14 18  Temp:    SpO2: 95% 95%    Last Pain:  Vitals:   12/17/20 0933  TempSrc:   PainSc: 0-No pain                 Asheley Hellberg,W. EDMOND

## 2020-12-17 NOTE — Progress Notes (Signed)
  Echocardiogram Echocardiogram Transesophageal has been performed.  Zachary Rogers 12/17/2020, 9:23 AM

## 2020-12-18 ENCOUNTER — Encounter (HOSPITAL_COMMUNITY): Payer: Self-pay | Admitting: Internal Medicine

## 2020-12-18 ENCOUNTER — Ambulatory Visit (INDEPENDENT_AMBULATORY_CARE_PROVIDER_SITE_OTHER): Payer: Medicare Other | Admitting: Family Medicine

## 2020-12-18 ENCOUNTER — Other Ambulatory Visit: Payer: Self-pay

## 2020-12-18 ENCOUNTER — Telehealth: Payer: Self-pay

## 2020-12-18 ENCOUNTER — Telehealth: Payer: Self-pay | Admitting: *Deleted

## 2020-12-18 VITALS — BP 118/67 | HR 58 | Temp 98.1°F | Wt 256.6 lb

## 2020-12-18 DIAGNOSIS — Q211 Atrial septal defect: Secondary | ICD-10-CM

## 2020-12-18 DIAGNOSIS — Z8673 Personal history of transient ischemic attack (TIA), and cerebral infarction without residual deficits: Secondary | ICD-10-CM

## 2020-12-18 DIAGNOSIS — E785 Hyperlipidemia, unspecified: Secondary | ICD-10-CM | POA: Diagnosis not present

## 2020-12-18 DIAGNOSIS — I10 Essential (primary) hypertension: Secondary | ICD-10-CM | POA: Diagnosis not present

## 2020-12-18 DIAGNOSIS — Q2112 Patent foramen ovale: Secondary | ICD-10-CM

## 2020-12-18 DIAGNOSIS — I639 Cerebral infarction, unspecified: Secondary | ICD-10-CM | POA: Diagnosis not present

## 2020-12-18 DIAGNOSIS — I253 Aneurysm of heart: Secondary | ICD-10-CM | POA: Insufficient documentation

## 2020-12-18 MED ORDER — AMLODIPINE BESYLATE 5 MG PO TABS: 5.0000 mg | ORAL_TABLET | Freq: Every day | ORAL | 0 refills | Status: DC

## 2020-12-18 NOTE — Assessment & Plan Note (Signed)
To see structural cardiologist for closure. Call with any concerns.

## 2020-12-18 NOTE — Assessment & Plan Note (Signed)
Under good control on current regimen. Continue current regimen. Continue to monitor. Call with any concerns. Refills up to date.   

## 2020-12-18 NOTE — Progress Notes (Signed)
BP 118/67   Pulse (!) 58   Temp 98.1 F (36.7 C) (Oral)   Wt 256 lb 9.6 oz (116.4 kg)   SpO2 96%   BMI 32.07 kg/m    Subjective:    Patient ID: Zachary Ley., male    DOB: 01/30/1951, 70 y.o.   MRN: [id number]  HPI: Zachary Rogers is a 70 y.o. male  Chief Complaint  Patient presents with   Follow-up    History of stroke    Hyperlipidemia   Saw Dr. Melrose Nakayama about 2 weeks ago- they are pleased with how he is doing. To stop plavix 8/30.  Saw Cardiology- in June and has had several studies done since then. He had a bubble study that showed patent foramen ovale. Had a TEE done yesterday which showed that it is large. To see structural cardiologist for evaluation for closure.   HYPERTENSION / HYPERLIPIDEMIA Satisfied with current treatment? yes Duration of hypertension: chronic BP monitoring frequency: a few times a month BP medication side effects: no Past BP meds: amlodipine Duration of hyperlipidemia: chronic Cholesterol medication side effects: no Cholesterol supplements: fish oil Past cholesterol medications: atorvastatin Medication compliance: excellent compliance Aspirin: yes Recent stressors: no Recurrent headaches: no Visual changes: no Palpitations: no Dyspnea: no Chest pain: no Lower extremity edema: no Dizzy/lightheaded: no    Relevant past medical, surgical, family and social history reviewed and updated as indicated. Interim medical history since our last visit reviewed. Allergies and medications reviewed and updated.  Review of Systems  Constitutional: Negative.   Respiratory: Negative.    Cardiovascular: Negative.   Gastrointestinal: Negative.   Musculoskeletal: Negative.   Neurological: Negative.   Psychiatric/Behavioral: Negative.     Per HPI unless specifically indicated above     Objective:    BP 118/67   Pulse (!) 58   Temp 98.1 F (36.7 C) (Oral)   Wt 256 lb 9.6 oz (116.4 kg)   SpO2 96%   BMI 32.07 kg/m   Wt Readings  from Last 3 Encounters:  12/18/20 256 lb 9.6 oz (116.4 kg)  12/17/20 259 lb 0.7 oz (117.5 kg)  12/07/20 259 lb (117.5 kg)    Physical Exam Vitals and nursing note reviewed.  Constitutional:      General: He is not in acute distress.    Appearance: Normal appearance. He is not ill-appearing, toxic-appearing or diaphoretic.  HENT:     Head: Normocephalic and atraumatic.     Right Ear: External ear normal.     Left Ear: External ear normal.     Nose: Nose normal.     Mouth/Throat:     Mouth: Mucous membranes are moist.     Pharynx: Oropharynx is clear.  Eyes:     General: No scleral icterus.       Right eye: No discharge.        Left eye: No discharge.     Extraocular Movements: Extraocular movements intact.     Conjunctiva/sclera: Conjunctivae normal.     Pupils: Pupils are equal, round, and reactive to light.  Cardiovascular:     Rate and Rhythm: Normal rate and regular rhythm.     Pulses: Normal pulses.     Heart sounds: Normal heart sounds. No murmur heard.   No friction rub. No gallop.  Pulmonary:     Effort: Pulmonary effort is normal. No respiratory distress.     Breath sounds: Normal breath sounds. No stridor. No wheezing, rhonchi or rales.  Chest:  Chest wall: No tenderness.  Musculoskeletal:        General: Normal range of motion.     Cervical back: Normal range of motion and neck supple.  Skin:    General: Skin is warm and dry.     Capillary Refill: Capillary refill takes less than 2 seconds.     Coloration: Skin is not jaundiced or pale.     Findings: No bruising, erythema, lesion or rash.  Neurological:     General: No focal deficit present.     Mental Status: He is alert and oriented to person, place, and time. Mental status is at baseline.  Psychiatric:        Mood and Affect: Mood normal.        Behavior: Behavior normal.        Thought Content: Thought content normal.        Judgment: Judgment normal.    Results for orders placed or performed  during the hospital encounter of 12/11/20  CBC  Result Value Ref Range   WBC 5.7 4.0 - 10.5 K/uL   RBC 4.46 4.22 - 5.81 MIL/uL   Hemoglobin 14.4 13.0 - 17.0 g/dL   HCT 39.5 39.0 - 52.0 %   MCV 88.6 80.0 - 100.0 fL   MCH 32.3 26.0 - 34.0 pg   MCHC 36.5 (H) 30.0 - 36.0 g/dL   RDW 13.5 11.5 - 15.5 %   Platelets 167 150 - 400 K/uL   nRBC 0.0 0.0 - 0.2 %  Basic Metabolic Panel (BMET)  Result Value Ref Range   Sodium 141 135 - 145 mmol/L   Potassium 4.1 3.5 - 5.1 mmol/L   Chloride 110 98 - 111 mmol/L   CO2 23 22 - 32 mmol/L   Glucose, Bld 108 (H) 70 - 99 mg/dL   BUN 17 8 - 23 mg/dL   Creatinine, Ser 1.10 0.61 - 1.24 mg/dL   Calcium 9.2 8.9 - 10.3 mg/dL   GFR, Estimated >60 >60 mL/min   Anion gap 8 5 - 15      Assessment & Plan:   Problem List Items Addressed This Visit       Cardiovascular and Mediastinum   Essential hypertension    Under good control on current regimen. Continue current regimen. Continue to monitor. Call with any concerns. Refills given until follow up.        Relevant Medications   amLODipine (NORVASC) 5 MG tablet   PFO (patent foramen ovale) - Primary    To see structural cardiologist for closure. Call with any concerns.        Relevant Medications   amLODipine (NORVASC) 5 MG tablet     Other   Hyperlipidemia LDL goal <70    Under good control on current regimen. Continue current regimen. Continue to monitor. Call with any concerns. Refills up to date.          Relevant Medications   amLODipine (NORVASC) 5 MG tablet   History of stroke    Will stop his plavix on 8/30. Feeling well. No concerns.          Follow up plan: Return November Follow up.

## 2020-12-18 NOTE — Telephone Encounter (Signed)
Spoke to pt, notified of TEE results and Dr. Darnelle Bos recc.  Pt voiced understanding.  Referral has been placed to structural heart clinic to see Dr. Burt Knack.  Pt aware this office will contact him for consult appt.  Pt has no further questions at this time.

## 2020-12-18 NOTE — Telephone Encounter (Signed)
-----   Message from Nelva Bush, MD sent at 12/18/2020  6:57 AM EDT ----- Please let Mr. Dominica Severin know that the transesophageal echocardiogram confirms a large PFO.  Please refer him to Dr. Burt Knack to discuss closure.

## 2020-12-18 NOTE — Assessment & Plan Note (Signed)
Under good control on current regimen. Continue current regimen. Continue to monitor. Call with any concerns. Refills given until follow up.

## 2020-12-18 NOTE — Telephone Encounter (Signed)
Per Dr. Saunders Revel, scheduled the patient for PFO consult with Dr. Burt Knack 01/18/21. The patient was grateful for call and agrees with plan.

## 2020-12-18 NOTE — Assessment & Plan Note (Signed)
Will stop his plavix on 8/30. Feeling well. No concerns.

## 2020-12-24 ENCOUNTER — Ambulatory Visit (HOSPITAL_COMMUNITY): Admit: 2020-12-24 | Payer: TRICARE For Life (TFL) | Admitting: Internal Medicine

## 2020-12-24 ENCOUNTER — Encounter (HOSPITAL_COMMUNITY): Payer: TRICARE For Life (TFL)

## 2020-12-24 SURGERY — ECHOCARDIOGRAM, TRANSESOPHAGEAL
Anesthesia: Monitor Anesthesia Care

## 2021-01-18 ENCOUNTER — Ambulatory Visit (INDEPENDENT_AMBULATORY_CARE_PROVIDER_SITE_OTHER): Payer: Medicare Other | Admitting: Cardiovascular Disease

## 2021-01-18 ENCOUNTER — Other Ambulatory Visit: Payer: Self-pay

## 2021-01-18 ENCOUNTER — Encounter: Payer: Self-pay | Admitting: Cardiovascular Disease

## 2021-01-18 VITALS — BP 114/74 | HR 49 | Ht 75.0 in | Wt 262.8 lb

## 2021-01-18 DIAGNOSIS — Q211 Atrial septal defect: Secondary | ICD-10-CM | POA: Diagnosis not present

## 2021-01-18 DIAGNOSIS — I639 Cerebral infarction, unspecified: Secondary | ICD-10-CM | POA: Diagnosis not present

## 2021-01-18 DIAGNOSIS — I253 Aneurysm of heart: Secondary | ICD-10-CM

## 2021-01-18 LAB — BASIC METABOLIC PANEL
BUN/Creatinine Ratio: 14 (ref 10–24)
BUN: 16 mg/dL (ref 8–27)
CO2: 22 mmol/L (ref 20–29)
Calcium: 9.3 mg/dL (ref 8.6–10.2)
Chloride: 103 mmol/L (ref 96–106)
Creatinine, Ser: 1.14 mg/dL (ref 0.76–1.27)
Glucose: 97 mg/dL (ref 65–99)
Potassium: 3.8 mmol/L (ref 3.5–5.2)
Sodium: 141 mmol/L (ref 134–144)
eGFR: 68 mL/min/{1.73_m2} (ref >59)

## 2021-01-18 LAB — CBC
Hematocrit: 42.5 % (ref 37.5–51.0)
Hemoglobin: 14.5 g/dL (ref 13.0–17.7)
MCH: 30.6 pg (ref 26.6–33.0)
MCHC: 34.1 g/dL (ref 31.5–35.7)
MCV: 90 fL (ref 79–97)
Platelets: 157 10*3/uL (ref 150–450)
RBC: 4.74 x10E6/uL (ref 4.14–5.80)
RDW: 13.5 % (ref 11.6–15.4)
WBC: 5.9 10*3/uL (ref 3.4–10.8)

## 2021-01-18 NOTE — Progress Notes (Signed)
Cardiology Office Note:    Date:  01/18/2021   ID:  Zachary Rogers., DOB 01-14-1951, MRN [id number]  PCP:  Zachary Roys, DO   [facility] Providers Cardiologist:  None     Referring MD: Zachary Bush, MD   Chief Complaint  Patient presents with   PFO    History of Present Illness:    Zachary Rogers. is a 70 y.o. male presenting for evaluation of PFO, referred by Dr End.   The patient is here with his wife today. He was on a cruise ship in the [place], and was in the bathroom when he developed sudden onset of weakness and clumsiness in his right arm and leg. He stood up and fell to the ground. He then was able to get back to bed and he stayed in bed for a few hours. He was taken to a hospital in [place] and underwent evaluation and treatment there. His symptoms improved over time and he denies any residual problems. He wore a heart monitor for 2 weeks and this showed no arrhythmia. Reportedly the MRI in [place] showed an acute stroke and a prior stroke. A follow-up MRI 12/04/20 confirmed this with demonstration of small cortical infarcts in the posterior left frontal lobe, high medial parietal lobe, and left parieto-occipital lobes.   The patient is a retired Animator. He has always been healthy per his report. He has had hypertension that has been well-controlled on amlodipine. 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 Today, he denies symptoms of palpitations, chest pain, shortness of breath, orthopnea, PND, lower extremity edema, dizziness, or syncope.   Past Medical History:  Diagnosis Date   Cerebrovascular accident (CVA) ([place]) 10/25/2020   Hypertension    Lateral epicondylitis (tennis elbow) 09/04/2020   Mixed hyperlipidemia 07/18/2019   Nephrolithiasis     Past Surgical History:  Procedure Laterality Date   BUBBLE STUDY  12/17/2020   Procedure: BUBBLE STUDY;  Surgeon: Zachary Records, MD;  Location: [facility];  Service:  Cardiovascular;;   CHOLECYSTECTOMY  06/07/1998   COLONOSCOPY     COLONOSCOPY WITH PROPOFOL N/A 11/09/2018   Procedure: COLONOSCOPY WITH PROPOFOL;  Surgeon: [name], MD;  Location: [facility] ENDOSCOPY;  Service: Gastroenterology;  Laterality: N/A;   SKIN LESION EXCISION     dermatlogy - precancerous lesion 1 year ago on back    TEE WITHOUT CARDIOVERSION N/A 12/17/2020   Procedure: TRANSESOPHAGEAL ECHOCARDIOGRAM (TEE);  Surgeon: Zachary Records, MD;  Location: [facility] ENDOSCOPY;  Service: Cardiovascular;  Laterality: N/A;    Current Medications: Current Meds  Medication Sig   amLODipine (NORVASC) 5 MG tablet Take 1 tablet (5 mg total) by mouth daily.   Ascorbic Acid (VITAMIN C) 1000 MG tablet Take 1,000 mg by mouth daily.   aspirin 81 MG EC tablet Take 81 mg by mouth daily.   atorvastatin (LIPITOR) 80 MG tablet Take 1 tablet (80 mg total) by mouth daily.   cholecalciferol (VITAMIN D) 25 MCG (1000 UNIT) tablet Take 1,000 Units by mouth daily.   Coenzyme Q10 (CO Q 10 PO) Take 1 tablet by mouth daily.   Cyanocobalamin (VITAMIN B 12 PO) Take 1 tablet by mouth daily.   Glucosamine-Chondroit-Vit C-Mn (GLUCOSAMINE 1500 COMPLEX PO) Take 1,500 mg by mouth 2 (two) times daily.   Methylsulfonylmethane (MSM) 1500 MG TABS Take 1,500 mg by mouth 2 (two) times daily.   Omega-3 Fatty Acids (FISH OIL) 1200  MG CAPS Take 1,200 mg by mouth 2 (two) times daily.   Potassium 99 MG TABS Take 99 mg by mouth daily.   Zinc Sulfate (ZINC 15 PO) Take 1 tablet by mouth 4 (four) times a week.     Allergies:   Patient has no known allergies.   Social History   Socioeconomic History   Marital status: Married    Spouse name: Not on file   Number of children: Not on file   Years of education: Not on file   Highest education level: Master's degree (e.g., MA, MS, MEng, MEd, MSW, MBA)  Occupational History   Occupation: working fulltime   Tobacco Use   Smoking status: Former    Packs/day: 0.00    Years: 0.00    Pack years:  0.00    Types: Cigarettes    Quit date: 2000    Years since quitting: 22.6   Smokeless tobacco: Never   Tobacco comments:    social smoker when younger   Media planner   Vaping Use: Never used  Substance and Sexual Activity   Alcohol use: Yes    Alcohol/week: 5.0 standard drinks    Types: 5 Glasses of wine per week   Drug use: No   Sexual activity: Yes    Birth control/protection: Surgical  Other Topics Concern   Not on file  Social History Theatre stage manager, local friends.    Social Determinants of Health   Financial Resource Strain: Low Risk    Difficulty of Paying Living Expenses: Not hard at all  Food Insecurity: No Food Insecurity   Worried About Charity fundraiser in the Last Year: Never true   [place] in the Last Year: Never true  Transportation Needs: No Transportation Needs   Lack of Transportation (Medical): No   Lack of Transportation (Non-Medical): No  Physical Activity: Sufficiently Active   Days of Exercise per Week: 3 days   Minutes of Exercise per Session: 60 min  Stress: No Stress Concern Present   Feeling of Stress : Not at all  Social Connections: Not on file     Family History: The patient's family history includes Diabetes in his father; Heart attack in his maternal grandfather; Hypertension in his father; Lung cancer in his mother; Transient ischemic attack in his father. There is no history of Prostate cancer, Bladder Cancer, or Kidney cancer.  ROS:   Please see the history of present illness.    Positive for shoulder pain, hip pain. All other systems reviewed and are negative.  EKGs/Labs/Other Studies Reviewed:    The following studies were reviewed today: Long Term Monitor: Study Highlights    The patient was monitored for 13 days, 20 hours. The predominant rhythm was sinus with an average rate of 57 bpm (range 37-108 bpm in sinus). There were occasional PAC's and rare PVC's. 26 atrial runs occurred, lasting up to 16  beats with a maximum rate of 174 bpm. At least 1 episode of idioventricular rhythm was seen. No sustained arrhythmia or prolonged pause was observed. There were no patient triggered events.   Predominantly sinus rhythm with occasional PAC's and rare PVC's, as well as several brief runs of PSVT.  At least one episode of idioventricular rhythm was also noted.  There was no sustained arrhythmia, including atrial fibrillation/flutter.  TEE: IMPRESSIONS     1. Left ventricular ejection fraction, by estimation, is 60 to 65%. The  left ventricle has normal function.  2. Right ventricular systolic function is normal. The right ventricular  size is normal.   3. No left atrial/left atrial appendage thrombus was detected.   4. The interatrial septum is aneurysmal with bowing both into L and R  atrium There is a large PFO that is easily seen with color doppler with L  to R flow. Alos shown when tested with injection of agitated saline with  appearance of bubbles seen in left  sided chambers .   5. The mitral valve is normal in structure. Mild mitral valve  regurgitation.   6. Aortic valve regurgitation is trivial.   7. Minimal fixed plaquing in the thoracic aorta.   CT Angio Head/Neck: IMPRESSION: Small remote left cerebral cortex and bilateral cerebellar infarcts. Atheromatous disease is mild, especially for age, and no major arterial stenosis is seen - consider central embolic disease.  EKG:  EKG is ordered today.  The ekg ordered today demonstrates sinus bradycardia 49 bpm, leftward axis, otherwise normal  Recent Labs: 11/05/2020: ALT 39 12/11/2020: BUN 17; Creatinine, Ser 1.10; Hemoglobin 14.4; Platelets 167; Potassium 4.1; Sodium 141  Recent Lipid Panel    Component Value Date/Time   CHOL 114 11/05/2020 1001   TRIG 56 11/05/2020 1001   HDL 69 11/05/2020 1001   LDLCALC 32 11/05/2020 1001     Risk Assessment/Calculations:      Physical Exam:    VS:  BP 114/74   Pulse (!) 49    Ht '6\' 3"'$  (1.905 m)   Wt 262 lb 12.8 oz (119.2 kg)   SpO2 96%   BMI 32.85 kg/m     Wt Readings from Last 3 Encounters:  01/18/21 262 lb 12.8 oz (119.2 kg)  12/18/20 256 lb 9.6 oz (116.4 kg)  12/17/20 259 lb 0.7 oz (117.5 kg)     GEN:  Well nourished, well developed in no acute distress HEENT: Normal NECK: No JVD; No carotid bruits LYMPHATICS: No lymphadenopathy CARDIAC: RRR, no murmurs, rubs, gallops RESPIRATORY:  Clear to auscultation without rales, wheezing or rhonchi  ABDOMEN: Soft, non-tender, non-distended MUSCULOSKELETAL:  No edema; No deformity  SKIN: Warm and dry NEUROLOGIC:  Alert and oriented x 3 PSYCHIATRIC:  Normal affect   ASSESSMENT:    1. PFO with atrial septal aneurysm    PLAN:    In order of problems listed above:  The patient has a large PFO with atrial septal aneurysm.  I personally reviewed his TEE images which demonstrate bidirectional but predominantly left-to-right flow at rest across the PFO.  There is a strongly positive bubble study with early bubble transit across the interatrial septum.  There are no other significant structural cardiac abnormalities noted with normal valve function, normal LV and RV function.  I reviewed the available data regarding transcatheter PFO closure and secondary risk reduction of stroke.  The patient understands that the predominance of available data is with patient's who are less than 74 years old based on randomized control trials.  In this individual patient, his only real stroke risk factor is hypertension.  I reviewed extensive data including CT angiography studies, event monitoring, and lab results.  He has minimal atherosclerotic disease noted on CT angiography of the head and neck.  His MRI and CTA studies have demonstrated multi territory cortical infarcts suggestive of cardioembolism.  His stroke occurred in the setting that could be consistent with paradoxical embolism as he had a long flight and then was on a  cruise ship.  In addition, he has a large defect  with atrial septal aneurysm, placing him at higher risk based on those anatomic features.  I demonstrated the PFO closure device and discussed the procedural steps with the patient and his wife.  We discussed specific risks including vascular injury, bleeding, stroke, myocardial infarction, cardiac injury, hemopericardium, emergency surgery, device embolization, and death.  They understand that these risks occur at a low incidence of less than 1%.  The patient provides full informed consent and would like to proceed at the next available time.  He will be started back on clopidogrel and will continue low-dose aspirin.  He understands that he will require dual antiplatelet therapy for 3 to 6 months after the procedure.  He will continue on current medical therapy which includes low-dose aspirin, amlodipine with very well-controlled blood pressure, and a high intensity statin drug with recent LDL cholesterol of 32 mg/dL.   Medication Adjustments/Labs and Tests Ordered: Current medicines are reviewed at length with the patient today.  Concerns regarding medicines are outlined above.  No orders of the defined types were placed in this encounter.  No orders of the defined types were placed in this encounter.   There are no Patient Instructions on file for this visit.   Signed, Sherren Mocha, MD  01/18/2021 8:56 AM    [place]

## 2021-01-18 NOTE — H&P (View-Only) (Signed)
Cardiology Office Note:    Date:  01/18/2021   ID:  Zachary Rogers., DOB May 29, 1950, MRN [id number]  PCP:  Zachary Roys, DO   [facility] Providers Cardiologist:  None     Referring MD: Nelva Bush, MD   Chief Complaint  Patient presents with   PFO    History of Present Illness:    Zachary Rogers. is a 70 y.o. male presenting for evaluation of PFO, referred by Dr End.   The patient is here with his wife today. He was on a cruise ship in the [place], and was in the bathroom when he developed sudden onset of weakness and clumsiness in his right arm and leg. He stood up and fell to the ground. He then was able to get back to bed and he stayed in bed for a few hours. He was taken to a hospital in [place] and underwent evaluation and treatment there. His symptoms improved over time and he denies any residual problems. He wore a heart monitor for 2 weeks and this showed no arrhythmia. Reportedly the MRI in [place] showed an acute stroke and a prior stroke. A follow-up MRI 12/04/20 confirmed this with demonstration of small cortical infarcts in the posterior left frontal lobe, high medial parietal lobe, and left parieto-occipital lobes.   The patient is a retired Animator. He has always been healthy per his report. He has had hypertension that has been well-controlled on amlodipine. 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 Today, he denies symptoms of palpitations, chest pain, shortness of breath, orthopnea, PND, lower extremity edema, dizziness, or syncope.   Past Medical History:  Diagnosis Date   Cerebrovascular accident (CVA) ([place]) 10/25/2020   Hypertension    Lateral epicondylitis (tennis elbow) 09/04/2020   Mixed hyperlipidemia 07/18/2019   Nephrolithiasis     Past Surgical History:  Procedure Laterality Date   BUBBLE STUDY  12/17/2020   Procedure: BUBBLE STUDY;  Surgeon: Fay Records, MD;  Location: [place];  Service:  Cardiovascular;;   CHOLECYSTECTOMY  06/07/1998   COLONOSCOPY     COLONOSCOPY WITH PROPOFOL N/A 11/09/2018   Procedure: COLONOSCOPY WITH PROPOFOL;  Surgeon: [name], MD;  Location: [facility] ENDOSCOPY;  Service: Gastroenterology;  Laterality: N/A;   SKIN LESION EXCISION     dermatlogy - precancerous lesion 1 year ago on back    TEE WITHOUT CARDIOVERSION N/A 12/17/2020   Procedure: TRANSESOPHAGEAL ECHOCARDIOGRAM (TEE);  Surgeon: Fay Records, MD;  Location: [facility] ENDOSCOPY;  Service: Cardiovascular;  Laterality: N/A;    Current Medications: Current Meds  Medication Sig   amLODipine (NORVASC) 5 MG tablet Take 1 tablet (5 mg total) by mouth daily.   Ascorbic Acid (VITAMIN C) 1000 MG tablet Take 1,000 mg by mouth daily.   aspirin 81 MG EC tablet Take 81 mg by mouth daily.   atorvastatin (LIPITOR) 80 MG tablet Take 1 tablet (80 mg total) by mouth daily.   cholecalciferol (VITAMIN D) 25 MCG (1000 UNIT) tablet Take 1,000 Units by mouth daily.   Coenzyme Q10 (CO Q 10 PO) Take 1 tablet by mouth daily.   Cyanocobalamin (VITAMIN B 12 PO) Take 1 tablet by mouth daily.   Glucosamine-Chondroit-Vit C-Mn (GLUCOSAMINE 1500 COMPLEX PO) Take 1,500 mg by mouth 2 (two) times daily.   Methylsulfonylmethane (MSM) 1500 MG TABS Take 1,500 mg by mouth 2 (two) times daily.   Omega-3 Fatty Acids (FISH OIL) 1200  MG CAPS Take 1,200 mg by mouth 2 (two) times daily.   Potassium 99 MG TABS Take 99 mg by mouth daily.   Zinc Sulfate (ZINC 15 PO) Take 1 tablet by mouth 4 (four) times a week.     Allergies:   Patient has no known allergies.   Social History   Socioeconomic History   Marital status: Married    Spouse name: Not on file   Number of children: Not on file   Years of education: Not on file   Highest education level: Master's degree (e.g., MA, MS, MEng, MEd, MSW, MBA)  Occupational History   Occupation: working fulltime   Tobacco Use   Smoking status: Former    Packs/day: 0.00    Years: 0.00    Pack years:  0.00    Types: Cigarettes    Quit date: 2000    Years since quitting: 22.6   Smokeless tobacco: Never   Tobacco comments:    social smoker when younger   Media planner   Vaping Use: Never used  Substance and Sexual Activity   Alcohol use: Yes    Alcohol/week: 5.0 standard drinks    Types: 5 Glasses of wine per week   Drug use: No   Sexual activity: Yes    Birth control/protection: Surgical  Other Topics Concern   Not on file  Social History Theatre stage manager, local friends.    Social Determinants of Health   Financial Resource Strain: Low Risk    Difficulty of Paying Living Expenses: Not hard at all  Food Insecurity: No Food Insecurity   Worried About Charity fundraiser in the Last Year: Never true   [place] in the Last Year: Never true  Transportation Needs: No Transportation Needs   Lack of Transportation (Medical): No   Lack of Transportation (Non-Medical): No  Physical Activity: Sufficiently Active   Days of Exercise per Week: 3 days   Minutes of Exercise per Session: 60 min  Stress: No Stress Concern Present   Feeling of Stress : Not at all  Social Connections: Not on file     Family History: The patient's family history includes Diabetes in his father; Heart attack in his maternal grandfather; Hypertension in his father; Lung cancer in his mother; Transient ischemic attack in his father. There is no history of Prostate cancer, Bladder Cancer, or Kidney cancer.  ROS:   Please see the history of present illness.    Positive for shoulder pain, hip pain. All other systems reviewed and are negative.  EKGs/Labs/Other Studies Reviewed:    The following studies were reviewed today: Long Term Monitor: Study Highlights    The patient was monitored for 13 days, 20 hours. The predominant rhythm was sinus with an average rate of 57 bpm (range 37-108 bpm in sinus). There were occasional PAC's and rare PVC's. 26 atrial runs occurred, lasting up to 16  beats with a maximum rate of 174 bpm. At least 1 episode of idioventricular rhythm was seen. No sustained arrhythmia or prolonged pause was observed. There were no patient triggered events.   Predominantly sinus rhythm with occasional PAC's and rare PVC's, as well as several brief runs of PSVT.  At least one episode of idioventricular rhythm was also noted.  There was no sustained arrhythmia, including atrial fibrillation/flutter.  TEE: IMPRESSIONS     1. Left ventricular ejection fraction, by estimation, is 60 to 65%. The  left ventricle has normal function.  2. Right ventricular systolic function is normal. The right ventricular  size is normal.   3. No left atrial/left atrial appendage thrombus was detected.   4. The interatrial septum is aneurysmal with bowing both into L and R  atrium There is a large PFO that is easily seen with color doppler with L  to R flow. Alos shown when tested with injection of agitated saline with  appearance of bubbles seen in left  sided chambers .   5. The mitral valve is normal in structure. Mild mitral valve  regurgitation.   6. Aortic valve regurgitation is trivial.   7. Minimal fixed plaquing in the thoracic aorta.   CT Angio Head/Neck: IMPRESSION: Small remote left cerebral cortex and bilateral cerebellar infarcts. Atheromatous disease is mild, especially for age, and no major arterial stenosis is seen - consider central embolic disease.  EKG:  EKG is ordered today.  The ekg ordered today demonstrates sinus bradycardia 49 bpm, leftward axis, otherwise normal  Recent Labs: 11/05/2020: ALT 39 12/11/2020: BUN 17; Creatinine, Ser 1.10; Hemoglobin 14.4; Platelets 167; Potassium 4.1; Sodium 141  Recent Lipid Panel    Component Value Date/Time   CHOL 114 11/05/2020 1001   TRIG 56 11/05/2020 1001   HDL 69 11/05/2020 1001   LDLCALC 32 11/05/2020 1001     Risk Assessment/Calculations:      Physical Exam:    VS:  BP 114/74   Pulse (!) 49    Ht '6\' 3"'$  (1.905 m)   Wt 262 lb 12.8 oz (119.2 kg)   SpO2 96%   BMI 32.85 kg/m     Wt Readings from Last 3 Encounters:  01/18/21 262 lb 12.8 oz (119.2 kg)  12/18/20 256 lb 9.6 oz (116.4 kg)  12/17/20 259 lb 0.7 oz (117.5 kg)     GEN:  Well nourished, well developed in no acute distress HEENT: Normal NECK: No JVD; No carotid bruits LYMPHATICS: No lymphadenopathy CARDIAC: RRR, no murmurs, rubs, gallops RESPIRATORY:  Clear to auscultation without rales, wheezing or rhonchi  ABDOMEN: Soft, non-tender, non-distended MUSCULOSKELETAL:  No edema; No deformity  SKIN: Warm and dry NEUROLOGIC:  Alert and oriented x 3 PSYCHIATRIC:  Normal affect   ASSESSMENT:    1. PFO with atrial septal aneurysm    PLAN:    In order of problems listed above:  The patient has a large PFO with atrial septal aneurysm.  I personally reviewed his TEE images which demonstrate bidirectional but predominantly left-to-right flow at rest across the PFO.  There is a strongly positive bubble study with early bubble transit across the interatrial septum.  There are no other significant structural cardiac abnormalities noted with normal valve function, normal LV and RV function.  I reviewed the available data regarding transcatheter PFO closure and secondary risk reduction of stroke.  The patient understands that the predominance of available data is with patient's who are less than 77 years old based on randomized control trials.  In this individual patient, his only real stroke risk factor is hypertension.  I reviewed extensive data including CT angiography studies, event monitoring, and lab results.  He has minimal atherosclerotic disease noted on CT angiography of the head and neck.  His MRI and CTA studies have demonstrated multi territory cortical infarcts suggestive of cardioembolism.  His stroke occurred in the setting that could be consistent with paradoxical embolism as he had a long flight and then was on a  cruise ship.  In addition, he has a large defect  with atrial septal aneurysm, placing him at higher risk based on those anatomic features.  I demonstrated the PFO closure device and discussed the procedural steps with the patient and his wife.  We discussed specific risks including vascular injury, bleeding, stroke, myocardial infarction, cardiac injury, hemopericardium, emergency surgery, device embolization, and death.  They understand that these risks occur at a low incidence of less than 1%.  The patient provides full informed consent and would like to proceed at the next available time.  He will be started back on clopidogrel and will continue low-dose aspirin.  He understands that he will require dual antiplatelet therapy for 3 to 6 months after the procedure.  He will continue on current medical therapy which includes low-dose aspirin, amlodipine with very well-controlled blood pressure, and a high intensity statin drug with recent LDL cholesterol of 32 mg/dL.   Medication Adjustments/Labs and Tests Ordered: Current medicines are reviewed at length with the patient today.  Concerns regarding medicines are outlined above.  No orders of the defined types were placed in this encounter.  No orders of the defined types were placed in this encounter.   There are no Patient Instructions on file for this visit.   Signed, Sherren Mocha, MD  01/18/2021 8:56 AM    [place]

## 2021-01-18 NOTE — Patient Instructions (Addendum)
   Medication Instructions:  Your physician has recommended you make the following change in your medication: Resume Clopidogrel 75 mg daily today  *If you need a refill on your cardiac medications before your next appointment, please call your pharmacy*   Lab Work: Lab work to be done today--BMP and CBC If you have labs (blood work) drawn today and your tests are completely normal, you will receive your results only by: [place] (if you have MyChart) OR A paper copy in the mail If you have any lab test that is abnormal or we need to change your treatment, we will call you to review the results.   Testing/Procedures: PFO closure--September 28,2022   CLOSURE INSTRUCTIONS: You are scheduled for a PFO/ASD CLOSURE on Wednesday, September 28 with Dr. Sherren Mocha.  1. Please arrive at the [facility] (Main Entrance A) at [facility]: [address] [place], [place] [address] at 6:30 AM (This time is two hours before your procedure to ensure your preparation). Free valet parking service is available. You are allowed ONE visitor in the waiting room during your procedure. Both you and your guest must wear masks. Special note: Every effort is made to have your procedure done on time. Please understand that emergencies sometimes delay scheduled procedures.  2. Diet: Make sure to stay well hydrated the day before your procedure! Do not eat solid foods after midnight.  You may have clear liquids until 5am upon the day of the procedure.  3. Labs: done in office on 01/18/21  4. Medication instructions in preparation for your procedure:    MAKE SURE TO TAKE YOUR ASPIRIN AND PLAVIX the morning of your closure       Other meds may be taken as directed with a sip of water.    5. Plan for one night stay--bring personal belongings (this is a disclaimer, not an intention. We want you to go home!). 6. Bring a current list of your medications and current insurance cards. 7. You MUST  have a responsible person to drive you home. 8. Someone MUST be with you the first 24 hours after you arrive home or your discharge will be delayed. 9. Please wear clothes that are easy to get on and off and wear slip-on shoes.   FOLLOW-UP APPOINTMENT: You are scheduled for your 1 month follow-up on Thursday, November 3 with Nell Range, [place]. [place] [address]

## 2021-02-12 ENCOUNTER — Telehealth: Payer: Self-pay | Admitting: *Deleted

## 2021-02-12 NOTE — Telephone Encounter (Signed)
PFO closure scheduled at [facility] for: Wednesday February 13, 2021 8:30 AM [facility] Main Entrance A [facility]) at: 6:30 AM   No solid food after midnight prior to cath, clear liquids until 5 AM day of procedure.   Usual morning medications can be taken pre-cath with sips of water including: -aspirin 81 mg -Plavix 75 mg    Confirmed patient has responsible adult to drive home post procedure and be with patient first 24 hours after arriving home.   You are allowed one visitor in the waiting room during the time you are at the hospital for your procedure. Both you and your visitor must wear a mask once you enter the hospital.   Patient reports does not currently have any symptoms concerning for COVID-19 and no household members with COVID-19 like illness.      LMTCB to review procedure instructions with patient.

## 2021-02-12 NOTE — Telephone Encounter (Signed)
Left message for pt to call back to review procedure instructions

## 2021-02-12 NOTE — Telephone Encounter (Signed)
Left detailed voicemail message (DPR) with procedure instructions.

## 2021-02-13 ENCOUNTER — Other Ambulatory Visit: Payer: Self-pay

## 2021-02-13 ENCOUNTER — Other Ambulatory Visit: Payer: Self-pay | Admitting: Physician Assistant

## 2021-02-13 ENCOUNTER — Encounter (HOSPITAL_COMMUNITY)
Admission: RE | Disposition: A | Discharge status: Home / Self Care | Payer: Self-pay | Source: Home / Self Care | Attending: Cardiovascular Disease

## 2021-02-13 ENCOUNTER — Ambulatory Visit (HOSPITAL_BASED_OUTPATIENT_CLINIC_OR_DEPARTMENT_OTHER): Payer: Medicare Other

## 2021-02-13 ENCOUNTER — Encounter (HOSPITAL_COMMUNITY): Payer: Self-pay | Admitting: Cardiovascular Disease

## 2021-02-13 ENCOUNTER — Ambulatory Visit (HOSPITAL_COMMUNITY)
Admission: RE | Admit: 2021-02-13 | Discharge: 2021-02-13 | Disposition: A | Discharge status: Home / Self Care | Payer: Medicare Other | Attending: Cardiovascular Disease | Admitting: Cardiovascular Disease

## 2021-02-13 DIAGNOSIS — Z87891 Personal history of nicotine dependence: Secondary | ICD-10-CM | POA: Diagnosis not present

## 2021-02-13 DIAGNOSIS — Q211 Atrial septal defect: Secondary | ICD-10-CM

## 2021-02-13 DIAGNOSIS — Z8249 Family history of ischemic heart disease and other diseases of the circulatory system: Secondary | ICD-10-CM | POA: Insufficient documentation

## 2021-02-13 DIAGNOSIS — Z8673 Personal history of transient ischemic attack (TIA), and cerebral infarction without residual deficits: Secondary | ICD-10-CM | POA: Insufficient documentation

## 2021-02-13 DIAGNOSIS — Z7982 Long term (current) use of aspirin: Secondary | ICD-10-CM | POA: Insufficient documentation

## 2021-02-13 DIAGNOSIS — I351 Nonrheumatic aortic (valve) insufficiency: Secondary | ICD-10-CM | POA: Insufficient documentation

## 2021-02-13 DIAGNOSIS — I10 Essential (primary) hypertension: Secondary | ICD-10-CM | POA: Insufficient documentation

## 2021-02-13 DIAGNOSIS — I253 Aneurysm of heart: Secondary | ICD-10-CM

## 2021-02-13 DIAGNOSIS — Q2112 Patent foramen ovale: Secondary | ICD-10-CM

## 2021-02-13 DIAGNOSIS — Z79899 Other long term (current) drug therapy: Secondary | ICD-10-CM | POA: Diagnosis not present

## 2021-02-13 HISTORY — PX: PATENT FORAMEN OVALE(PFO) CLOSURE: CATH118300

## 2021-02-13 LAB — POCT ACTIVATED CLOTTING TIME
Activated Clotting Time: 219 seconds
Activated Clotting Time: 237 seconds

## 2021-02-13 LAB — ECHOCARDIOGRAM LIMITED
Height: 75 in
P 1/2 time: 627 msec
Weight: 4160 oz

## 2021-02-13 SURGERY — PATENT FORAMEN OVALE (PFO) CLOSURE
Anesthesia: LOCAL

## 2021-02-13 MED ORDER — HEPARIN SODIUM (PORCINE) 1000 UNIT/ML IJ SOLN
INTRAMUSCULAR | Status: DC | PRN
  Administered 2021-02-13: 4000 [IU] via INTRAVENOUS
  Administered 2021-02-13: 10000 [IU] via INTRAVENOUS

## 2021-02-13 MED ORDER — CEFAZOLIN SODIUM-DEXTROSE 2-4 GM/100ML-% IV SOLN
INTRAVENOUS | Status: AC
  Filled 2021-02-13: qty 100

## 2021-02-13 MED ORDER — SODIUM CHLORIDE 0.9 % WEIGHT BASED INFUSION
3.0000 mL/kg/h | INTRAVENOUS | Status: AC
  Administered 2021-02-13: 3 mL/kg/h via INTRAVENOUS

## 2021-02-13 MED ORDER — CEFAZOLIN SODIUM-DEXTROSE 2-4 GM/100ML-% IV SOLN
2.0000 g | INTRAVENOUS | Status: AC
  Administered 2021-02-13: 2 g via INTRAVENOUS

## 2021-02-13 MED ORDER — CLOPIDOGREL BISULFATE 75 MG PO TABS: 75.0000 mg | ORAL_TABLET | Freq: Every day | ORAL | 1 refills | Status: DC

## 2021-02-13 MED ORDER — SODIUM CHLORIDE 0.9% FLUSH: 3.0000 mL | Freq: Two times a day (BID) | INTRAVENOUS | Status: DC

## 2021-02-13 MED ORDER — LIDOCAINE HCL (PF) 1 % IJ SOLN
INTRAMUSCULAR | Status: DC | PRN
  Administered 2021-02-13: 15 mL

## 2021-02-13 MED ORDER — SODIUM CHLORIDE 0.9% FLUSH: 3.0000 mL | INTRAVENOUS | Status: DC | PRN

## 2021-02-13 MED ORDER — ACETAMINOPHEN 325 MG PO TABS: 650.0000 mg | ORAL_TABLET | ORAL | Status: DC | PRN

## 2021-02-13 MED ORDER — LABETALOL HCL 5 MG/ML IV SOLN: 10.0000 mg | INTRAVENOUS | Status: DC | PRN

## 2021-02-13 MED ORDER — ASPIRIN 81 MG PO CHEW: 81.0000 mg | CHEWABLE_TABLET | ORAL | Status: DC

## 2021-02-13 MED ORDER — HYDRALAZINE HCL 20 MG/ML IJ SOLN: 10.0000 mg | INTRAMUSCULAR | Status: DC | PRN

## 2021-02-13 MED ORDER — SODIUM CHLORIDE 0.9 % IV SOLN: 250.0000 mL | INTRAVENOUS | Status: DC | PRN

## 2021-02-13 MED ORDER — HEPARIN (PORCINE) IN NACL 1000-0.9 UT/500ML-% IV SOLN
INTRAVENOUS | Status: DC | PRN
  Administered 2021-02-13: 500 mL

## 2021-02-13 MED ORDER — FENTANYL CITRATE (PF) 100 MCG/2ML IJ SOLN
INTRAMUSCULAR | Status: DC | PRN
  Administered 2021-02-13: 50 ug via INTRAVENOUS

## 2021-02-13 MED ORDER — FENTANYL CITRATE (PF) 100 MCG/2ML IJ SOLN
INTRAMUSCULAR | Status: AC
  Filled 2021-02-13: qty 2

## 2021-02-13 MED ORDER — SODIUM CHLORIDE 0.9 % WEIGHT BASED INFUSION: 1.0000 mL/kg/h | INTRAVENOUS | Status: DC

## 2021-02-13 MED ORDER — CLOPIDOGREL BISULFATE 75 MG PO TABS: 75.0000 mg | ORAL_TABLET | ORAL | Status: DC

## 2021-02-13 MED ORDER — MIDAZOLAM HCL 2 MG/2ML IJ SOLN
INTRAMUSCULAR | Status: AC
  Filled 2021-02-13: qty 2

## 2021-02-13 MED ORDER — HEPARIN SODIUM (PORCINE) 1000 UNIT/ML IJ SOLN
INTRAMUSCULAR | Status: AC
  Filled 2021-02-13: qty 1

## 2021-02-13 MED ORDER — HEPARIN (PORCINE) IN NACL 1000-0.9 UT/500ML-% IV SOLN
INTRAVENOUS | Status: AC
  Filled 2021-02-13: qty 500

## 2021-02-13 MED ORDER — MIDAZOLAM HCL 2 MG/2ML IJ SOLN
INTRAMUSCULAR | Status: DC | PRN
  Administered 2021-02-13: 2 mg via INTRAVENOUS

## 2021-02-13 MED ORDER — LIDOCAINE HCL (PF) 1 % IJ SOLN
INTRAMUSCULAR | Status: AC
  Filled 2021-02-13: qty 30

## 2021-02-13 MED ORDER — ONDANSETRON HCL 4 MG/2ML IJ SOLN: 4.0000 mg | Freq: Four times a day (QID) | INTRAMUSCULAR | Status: DC | PRN

## 2021-02-13 SURGICAL SUPPLY — 19 items
BALLN SIZING AMPLATZER 24 (BALLOONS) ×2
BALLOON SIZING AMPLATZER 24 (BALLOONS) ×1 IMPLANT
CATH ACUNAV 8FR 90CM (CATHETERS) ×2 IMPLANT
CATH INFINITI 6F MPA2 100CM (CATHETERS) ×2 IMPLANT
CLOSURE PERCLOSE PROSTYLE (VASCULAR PRODUCTS) ×4 IMPLANT
COVER SWIFTLINK CONNECTOR (BAG) ×2 IMPLANT
GUIDEWIRE AMPLATZER 1.5JX260 (WIRE) ×2 IMPLANT
KIT HEART LEFT (KITS) ×2 IMPLANT
OCCLUDER AMPLATZER SEPTAL 18MM (Prosthesis & Implant Heart) ×2 IMPLANT
PACK CARDIAC CATHETERIZATION (CUSTOM PROCEDURE TRAY) ×2 IMPLANT
PROTECTION STATION PRESSURIZED (MISCELLANEOUS) ×2
SHEATH INTROD W/O MIN 9FR 25CM (SHEATH) ×2 IMPLANT
SHEATH PINNACLE 8F 10CM (SHEATH) ×2 IMPLANT
STATION PROTECTION PRESSURIZED (MISCELLANEOUS) ×1 IMPLANT
SYS DELIVER AMP TREVISIO 9FR (SHEATH) ×2
SYSTEM DELIVER AMP TREVIS 9FR (SHEATH) ×1 IMPLANT
TRANSDUCER W/STOPCOCK (MISCELLANEOUS) ×2 IMPLANT
TUBING CIL FLEX 10 FLL-RA (TUBING) ×2 IMPLANT
WIRE J 3MM .035X145CM (WIRE) ×2 IMPLANT

## 2021-02-13 NOTE — Interval H&P Note (Signed)
History and Physical Interval Note:  02/13/2021 7:51 AM  Zachary Rogers.  has presented today for surgery, with the diagnosis of PFO.  The various methods of treatment have been discussed with the patient and family. After consideration of risks, benefits and other options for treatment, the patient has consented to  Procedure(s): PATENT FORAMEN OVALE (PFO) CLOSURE (N/A) as a surgical intervention.  The patient's history has been reviewed, patient examined, no change in status, stable for surgery.  I have reviewed the patient's chart and labs.  Questions were answered to the patient's satisfaction.    Pt seen in Short Stay prior to the procedure. He reports no interval changes in symptoms since my evaluation 01/18/2021. He has no further questions regarding today's PFO closure procedure.   Sherren Mocha

## 2021-02-13 NOTE — Discharge Instructions (Signed)

## 2021-02-25 DIAGNOSIS — I639 Cerebral infarction, unspecified: Secondary | ICD-10-CM | POA: Diagnosis not present

## 2021-02-25 DIAGNOSIS — M5432 Sciatica, left side: Secondary | ICD-10-CM | POA: Diagnosis not present

## 2021-02-25 DIAGNOSIS — Q2112 Patent foramen ovale: Secondary | ICD-10-CM | POA: Diagnosis not present

## 2021-02-25 DIAGNOSIS — Z8673 Personal history of transient ischemic attack (TIA), and cerebral infarction without residual deficits: Secondary | ICD-10-CM | POA: Diagnosis not present

## 2021-03-11 ENCOUNTER — Other Ambulatory Visit: Payer: Self-pay

## 2021-03-11 ENCOUNTER — Ambulatory Visit (INDEPENDENT_AMBULATORY_CARE_PROVIDER_SITE_OTHER): Payer: Medicare Other | Admitting: Dermatology

## 2021-03-11 DIAGNOSIS — Z1283 Encounter for screening for malignant neoplasm of skin: Secondary | ICD-10-CM

## 2021-03-11 DIAGNOSIS — Z86018 Personal history of other benign neoplasm: Secondary | ICD-10-CM | POA: Diagnosis not present

## 2021-03-11 DIAGNOSIS — D229 Melanocytic nevi, unspecified: Secondary | ICD-10-CM

## 2021-03-11 DIAGNOSIS — L814 Other melanin hyperpigmentation: Secondary | ICD-10-CM | POA: Diagnosis not present

## 2021-03-11 DIAGNOSIS — L821 Other seborrheic keratosis: Secondary | ICD-10-CM | POA: Diagnosis not present

## 2021-03-11 DIAGNOSIS — L578 Other skin changes due to chronic exposure to nonionizing radiation: Secondary | ICD-10-CM | POA: Diagnosis not present

## 2021-03-11 DIAGNOSIS — I639 Cerebral infarction, unspecified: Secondary | ICD-10-CM | POA: Diagnosis not present

## 2021-03-11 DIAGNOSIS — D1800 Hemangioma unspecified site: Secondary | ICD-10-CM | POA: Diagnosis not present

## 2021-03-11 NOTE — Patient Instructions (Signed)

## 2021-03-11 NOTE — Progress Notes (Signed)
   New Patient Visit  Subjective  Zachary Rogers. is a 70 y.o. male who presents for the following: Follow-up (Patient here today for tbse and re-establish care. He has no history of skin cancer. Patient reports no concerns today. ). Patient here for full body skin exam and skin cancer screening.  The following portions of the chart were reviewed this encounter and updated as appropriate:   Tobacco  Allergies  Meds  Problems  Med Hx  Surg Hx  Fam Hx     Review of Systems:  No other skin or systemic complaints except as noted in HPI or Assessment and Plan.  Objective  Well appearing patient in no apparent distress; mood and affect are within normal limits.  A full examination was performed including scalp, head, eyes, ears, nose, lips, neck, chest, axillae, abdomen, back, buttocks, bilateral upper extremities, bilateral lower extremities, hands, feet, fingers, toes, fingernails, and toenails. All findings within normal limits unless otherwise noted below.   Assessment & Plan  Skin cancer screening  Lentigines - Scattered tan macules - Due to sun exposure - Benign-appearing, observe - Recommend daily broad spectrum sunscreen SPF 30+ to sun-exposed areas, reapply every 2 hours as needed. - Call for any changes  Seborrheic Keratoses - Stuck-on, waxy, tan-brown papules and/or plaques  - Benign-appearing - Discussed benign etiology and prognosis. - Observe - Call for any changes  Melanocytic Nevi - Tan-brown and/or pink-flesh-colored symmetric macules and papules - Benign appearing on exam today - Observation - Call clinic for new or changing moles - Recommend daily use of broad spectrum spf 30+ sunscreen to sun-exposed areas.   Hemangiomas - Red papules - Discussed benign nature - Observe - Call for any changes  Actinic Damage - Chronic condition, secondary to cumulative UV/sun exposure - diffuse scaly erythematous macules with underlying dyspigmentation -  Recommend daily broad spectrum sunscreen SPF 30+ to sun-exposed areas, reapply every 2 hours as needed.  - Staying in the shade or wearing long sleeves, sun glasses (UVA+UVB protection) and wide brim hats (4-inch brim around the entire circumference of the hat) are also recommended for sun protection.  - Call for new or changing lesions.  History of Dysplastic Nevi - No evidence of recurrence today left mid back paraspinal 2019 - Recommend regular full body skin exams - Recommend daily broad spectrum sunscreen SPF 30+ to sun-exposed areas, reapply every 2 hours as needed.  - Call if any new or changing lesions are noted between office visits  Skin cancer screening performed today.  Return for 1 year tbse . IRuthell Rummage, CMA, am acting as scribe for Sarina Ser, MD. Documentation: I have reviewed the above documentation for accuracy and completeness, and I agree with the above.  Sarina Ser, MD

## 2021-03-13 ENCOUNTER — Encounter: Payer: Self-pay | Admitting: Dermatology

## 2021-03-20 ENCOUNTER — Telehealth: Payer: Self-pay | Admitting: Internal Medicine

## 2021-03-20 ENCOUNTER — Other Ambulatory Visit: Payer: Self-pay | Admitting: Physician Assistant

## 2021-03-20 ENCOUNTER — Ambulatory Visit (INDEPENDENT_AMBULATORY_CARE_PROVIDER_SITE_OTHER): Payer: Medicare Other | Admitting: Internal Medicine

## 2021-03-20 ENCOUNTER — Other Ambulatory Visit: Payer: Self-pay

## 2021-03-20 ENCOUNTER — Encounter: Payer: Self-pay | Admitting: Internal Medicine

## 2021-03-20 VITALS — BP 110/80 | HR 120 | Ht 75.0 in | Wt 266.0 lb

## 2021-03-20 DIAGNOSIS — I10 Essential (primary) hypertension: Secondary | ICD-10-CM

## 2021-03-20 DIAGNOSIS — I639 Cerebral infarction, unspecified: Secondary | ICD-10-CM | POA: Diagnosis not present

## 2021-03-20 DIAGNOSIS — I4891 Unspecified atrial fibrillation: Secondary | ICD-10-CM | POA: Insufficient documentation

## 2021-03-20 DIAGNOSIS — I4819 Other persistent atrial fibrillation: Secondary | ICD-10-CM | POA: Insufficient documentation

## 2021-03-20 DIAGNOSIS — Q2112 Patent foramen ovale: Secondary | ICD-10-CM

## 2021-03-20 MED ORDER — AMOXICILLIN 500 MG PO TABS: 2000.0000 mg | ORAL_TABLET | ORAL | 0 refills | Status: DC

## 2021-03-20 MED ORDER — APIXABAN 5 MG PO TABS: 5.0000 mg | ORAL_TABLET | Freq: Two times a day (BID) | ORAL | 0 refills | Status: DC

## 2021-03-20 MED ORDER — METOPROLOL SUCCINATE ER 25 MG PO TB24: 12.5000 mg | ORAL_TABLET | Freq: Every day | ORAL | 1 refills | Status: DC

## 2021-03-20 NOTE — Progress Notes (Addendum)
Follow-up Outpatient Visit Date: 03/20/2021  Primary Care Provider: Valerie Roys, DO [place] [address]  Chief Complaint: Follow-up cryptogenic stroke and patent foramen ovale  HPI:  Zachary Rogers is a 70 y.o. male with history of stroke while traveling to [place] earlier this year and subsequent discovery of large PFO now status postclosure, hypertension, hyperlipidemia, and nephrolithiasis, who presents for follow-up of PFO and stroke.  I last saw him in July, at which time he was doing well without any new focal neurologic deficits.  TEE confirmed PFO which was subsequently closed by Dr. Burt Knack on 02/13/2021.  Today, Zachary Rogers reports that he has been feeling well.  He has recovered well from his PFO closure and notes just a tiny bit of scar tissue at the femoral site.  He is not have any pain or swelling there.  He denies chest pain, shortness of breath, palpitations, lightheadedness, and edema.  He has noticed that his heart rates are a little bit higher over the last couple of weeks, especially when exercising.  Review of his smart watch shows some heart rates near 180 bpm (previously, heart rates with exercise would barely reach 100).  --------------------------------------------------------------------------------------------------  Cardiovascular History & Procedures: Cardiovascular Problems: Stroke Patent foramen ovale   Risk Factors: Hypertension, hyperlipidemia, male gender, tobacco use, obesity, and age greater than 53   Cath/PCI: None   CV Surgery: PFO closure (02/13/2021): 18 mm Amplatz atrial septal occluder   EP Procedures and Devices: 14-day event monitor (11/14/2020): Predominantly sinus rhythm with occasional PACs and rare PVCs as well as several brief runs of PSVT.  At least one episode of idioventricular rhythm was also noted.  No atrial fibrillation/flutter observed.   Non-Invasive Evaluation(s): Limited TTE (02/13/2021): Amplatz closure device in  place with no residual PFO.  Mild right atrial enlargement.  Mild mitral annular calcification and mitral valve thickening.  Trivial tricuspid regurgitation.  Aortic sclerosis. TEE (12/17/2020): Normal LV size.  LVEF 60-65%.  Normal RV size and function.  Normal biatrial size without thrombus.  Normal mitral valve with mild regurgitation.  Normal tricuspid valve with trivial regurgitation.  Tricuspid aortic valve with trivial regurgitation.  Normal pulmonic valve with mild regurgitation.  Minimal plaque in the thoracic aorta.  Atrial septal aneurysm with large PFO and positive bubble study noted. Carotid Doppler (11/13/2020): Mild right ICA stenosis.  Normal left carotid artery.  Atypical antegrade flow in both vertebral arteries.  Flow disturbance in right subclavian artery noted. TTE (11/06/2020): Mildly dilated LV with normal wall thickness.  Normal LVEF (50-55%) with normal wall motion.  Indeterminate diastolic parameters.  Normal RV size and function.  Normal biatrial size.  Trivial mitral and tricuspid regurgitation.  Positive bubble study.  Recent CV Pertinent Labs: Lab Results  Component Value Date   CHOL 114 11/05/2020   HDL 69 11/05/2020   LDLCALC 32 11/05/2020   TRIG 56 11/05/2020   K 3.8 01/18/2021   BUN 16 01/18/2021   CREATININE 1.14 01/18/2021    Past medical and surgical history were reviewed and updated in EPIC.  Current Meds  Medication Sig   amLODipine (NORVASC) 5 MG tablet Take 1 tablet (5 mg total) by mouth daily.   Ascorbic Acid (VITAMIN C) 1000 MG tablet Take 1,000 mg by mouth in the morning.   aspirin 81 MG EC tablet Take 81 mg by mouth daily.   atorvastatin (LIPITOR) 80 MG tablet Take 1 tablet (80 mg total) by mouth daily.   cholecalciferol (VITAMIN  D) 25 MCG (1000 UNIT) tablet Take 1,000 Units by mouth in the morning.   clopidogrel (PLAVIX) 75 MG tablet Take 1 tablet (75 mg total) by mouth daily.   Coenzyme Q10 (COQ10) 400 MG CAPS Take 400 mg by mouth daily in the  afternoon.   Cyanocobalamin (VITAMIN B-12) 5000 MCG SUBL Place 5,000 mcg under the tongue in the morning.   Glucosamine HCl-MSM (GLUCOSAMINE-MSM PO) Take 1 tablet by mouth in the morning and at bedtime.   Magnesium 100 MG TABS Take 100 mg by mouth daily in the afternoon.   Omega-3 Fatty Acids (FISH OIL) 1000 MG CAPS Take 1,000 mg by mouth in the morning and at bedtime.   Zinc Sulfate (ZINC 15 PO) Take 1 tablet by mouth 4 (four) times a week. Zinc Balance with Copper    Allergies: Patient has no known allergies.  Social History   Tobacco Use   Smoking status: Former    Packs/day: 0.00    Years: 0.00    Pack years: 0.00    Types: Cigarettes    Quit date: 2000    Years since quitting: 22.8   Smokeless tobacco: Never   Tobacco comments:    social smoker when younger   Vaping Use   Vaping Use: Never used  Substance Use Topics   Alcohol use: Yes    Alcohol/week: 5.0 standard drinks    Types: 5 Glasses of wine per week   Drug use: No    Family History  Problem Relation Age of Onset   Lung cancer Mother    Hypertension Father    Diabetes Father    Transient ischemic attack Father    Heart attack Maternal Grandfather    Prostate cancer Neg Hx    Bladder Cancer Neg Hx    Kidney cancer Neg Hx     Review of Systems: A 12-system review of systems was performed and was negative except as noted in the HPI.  --------------------------------------------------------------------------------------------------  Physical Exam: BP 110/80 (BP Location: Left Arm, Patient Position: Sitting, Cuff Size: Large)   Pulse (!) 120   Ht 6\' 3"  (1.905 m)   Wt 266 lb (120.7 kg)   SpO2 98%   BMI 33.25 kg/m   General:  NAD. Neck: No JVD or HJR. Lungs: Clear to auscultation bilaterally without wheezes or crackles. Heart: Tachycardic and irregularly irregular.  No murmurs, rubs, or gallops. Abdomen: Soft, nontender, nondistended. Extremities: No lower extremity edema.  EKG: Atrial  fibrillation with rapid ventricular response and left axis deviation.  Compared with 02/13/2021, atrial fibrillation has replaced sinus bradycardia.  Lab Results  Component Value Date   WBC 5.9 01/18/2021   HGB 14.5 01/18/2021   HCT 42.5 01/18/2021   MCV 90 01/18/2021   PLT 157 01/18/2021    Lab Results  Component Value Date   NA 141 01/18/2021   K 3.8 01/18/2021   CL 103 01/18/2021   CO2 22 01/18/2021   BUN 16 01/18/2021   CREATININE 1.14 01/18/2021   GLUCOSE 97 01/18/2021   ALT 39 11/05/2020    Lab Results  Component Value Date   CHOL 114 11/05/2020   HDL 69 11/05/2020   LDLCALC 32 11/05/2020   TRIG 56 11/05/2020    --------------------------------------------------------------------------------------------------  ASSESSMENT AND PLAN: New onset atrial fibrillation: Patient is asymptomatic but has likely been in atrial fibrillation (unclear if persistent or paroxysmal) for at least a couple of weeks.  Prior event monitor showed no evidence of atrial fibrillation.  Given a CHA2DS2-VASc  score of at least 4, I have recommended systemic anticoagulation.  I will reach out to Dr. Burt Knack regarding need to continue aspirin and/or clopidogrel with addition of a DOAC.  We will plan to add apixaban 5 mg twice daily and check a CBC, CMP, magnesium level, and TSH.  I will also add metoprolol succinate 12.5 mg daily, though we will need to watch Mr. Jacquelin Hawking heart rate closely if he converts back to sinus rhythm given a history of baseline sinus bradycardia.  I will have him return to the office in 3 weeks for reevaluation; if he remains in atrial fibrillation at that time, elective cardioversion will need to be considered after having completed 4 weeks of uninterrupted anticoagulation.  Patent foramen ovale: Patient status post uneventful closure by Dr. Burt Knack in late September.  I will reach out to Dr. Burt Knack to see if there could be any correlation between PFO closure and new diagnosis of  atrial fibrillation.  Cryptogenic stroke: Prior work-up notable for large PFO now status post closure.  With diagnosis of atrial fibrillation today, it is possible that PAF may have also contributed to his stroke.  We will initiate apixaban 5 mg twice daily, as above.  Defer changes to antiplatelet therapy pending further recommendations by Dr. Burt Knack.  Continue atorvastatin for secondary prevention.  Hypertension: Blood pressure normal today.  Continue current dose of losartan with addition of low-dose metoprolol, as above.  Follow-up: Return to clinic in 3 weeks.  Nelva Bush, MD 03/20/2021 9:22 AM  ADDENDUM: discussed with Dr. Burt Knack and we will STOP aspirin and Plavix now that he is on Eliquis. SBE prophylaxis x 6 months discussed; I have RX'd amoxicillin. Groin site is healing well. I have cancelled his apt with me tomorrow.  Angelena Form PA-C  [facility]

## 2021-03-20 NOTE — Telephone Encounter (Signed)
I spoke with [name] regarding Dr. Antionette Char recommendations to discontinue aspirin and clopidogrel with initiation of apixaban.  We will plan to start apixaban 5 mg twice daily this evening.  Dr. Burt Knack also noted that there can be a small incidence of atrial fibrillation following PFO closure, with the risk for continued atrial fibrillation decreasing over the next few weeks/months.  We will therefore plan to perform DCCV following 4 weeks of therapeutic anticoagulation if [name] remains in atrial fibrillation at his follow-up visit in 3 weeks.  Nelva Bush, MD [facility]

## 2021-03-20 NOTE — Patient Instructions (Signed)
Medication Instructions:   Your physician has recommended you make the following change in your medication:   START Eliquis 5 mg TWICE daily - Wait until our office calls you to start this medication. Rx has been sent to your pharmacy and samples given today in office. Use 30 day card when you pick up at pharmacy.   START Metoprolol Succinate 12.5 mg daily - An Rx has been sent to your pharmacy for 30 day. We can send in 90 day for refills after you start medication.  *If you need a refill on your cardiac medications before your next appointment, please call your pharmacy*   Lab Work:  Today: CBC, CMET, Magnesium, TSH  If you have labs (blood work) drawn today and your tests are completely normal, you will receive your results only by: [place] (if you have MyChart) OR A paper copy in the mail If you have any lab test that is abnormal or we need to change your treatment, we will call you to review the results.   Testing/Procedures:  None ordered   Follow-Up: At [facility], you and your health needs are our priority.  As part of our continuing mission to provide you with exceptional heart care, we have created designated Provider Care Teams.  These Care Teams include your primary Cardiologist (physician) and Advanced Practice Providers (APPs -  Physician Assistants and Nurse Practitioners) who all work together to provide you with the care you need, when you need it.  We recommend signing up for the patient portal called "MyChart".  Sign up information is provided on this After Visit Summary.  MyChart is used to connect with patients for Virtual Visits (Telemedicine).  Patients are able to view lab/test results, encounter notes, upcoming appointments, etc.  Non-urgent messages can be sent to your provider as well.   To learn more about what you can do with MyChart, go to [email or web address].    Your next appointment:   3 week(s)  The format for your next appointment:    In Person  Provider:   You may see Dr. Harrell Gave End or one of the following Advanced Practice Providers on your designated Care Team:   Murray Hodgkins, NP Christell Faith, PA-C Marrianne Mood, PA-C Cadence Colon, [place]

## 2021-03-21 ENCOUNTER — Telehealth: Payer: Self-pay | Admitting: *Deleted

## 2021-03-21 ENCOUNTER — Ambulatory Visit: Payer: Medicare Other | Admitting: Physician Assistant

## 2021-03-21 LAB — TSH: TSH: 1.25 u[IU]/mL (ref 0.450–4.500)

## 2021-03-21 LAB — COMPREHENSIVE METABOLIC PANEL
ALT: 35 IU/L (ref 0–44)
AST: 25 IU/L (ref 0–40)
Albumin/Globulin Ratio: 2 (ref 1.2–2.2)
Albumin: 4.3 g/dL (ref 3.7–4.7)
Alkaline Phosphatase: 109 IU/L (ref 44–121)
BUN/Creatinine Ratio: 11 (ref 10–24)
BUN: 14 mg/dL (ref 8–27)
Bilirubin Total: 2.2 mg/dL — ABNORMAL HIGH (ref 0.0–1.2)
CO2: 23 mmol/L (ref 20–29)
Calcium: 9.2 mg/dL (ref 8.6–10.2)
Chloride: 107 mmol/L — ABNORMAL HIGH (ref 96–106)
Creatinine, Ser: 1.33 mg/dL — ABNORMAL HIGH (ref 0.76–1.27)
Globulin, Total: 2.2 g/dL (ref 1.5–4.5)
Glucose: 110 mg/dL — ABNORMAL HIGH (ref 70–99)
Potassium: 3.9 mmol/L (ref 3.5–5.2)
Sodium: 142 mmol/L (ref 134–144)
Total Protein: 6.5 g/dL (ref 6.0–8.5)
eGFR: 57 mL/min/{1.73_m2} — ABNORMAL LOW (ref >59)

## 2021-03-21 LAB — CBC
Hematocrit: 43.2 % (ref 37.5–51.0)
Hemoglobin: 14.8 g/dL (ref 13.0–17.7)
MCH: 30 pg (ref 26.6–33.0)
MCHC: 34.3 g/dL (ref 31.5–35.7)
MCV: 88 fL (ref 79–97)
Platelets: 180 10*3/uL (ref 150–450)
RBC: 4.93 x10E6/uL (ref 4.14–5.80)
RDW: 13.3 % (ref 11.6–15.4)
WBC: 5.3 10*3/uL (ref 3.4–10.8)

## 2021-03-21 LAB — MAGNESIUM: Magnesium: 2 mg/dL (ref 1.6–2.3)

## 2021-03-21 NOTE — Telephone Encounter (Signed)
Notified pt of results and Dr. Darnelle Bos recc.  Pt voiced understanding.  Pt will STOP Aspirin and Clopidogrel and START Eliquis 5 mg TWICE daily. Pt will follow up 04/15/21 as scheduled.  Pt has no further questions at this time.

## 2021-03-21 NOTE — Telephone Encounter (Signed)
-----   Message from Nelva Bush, MD sent at 03/21/2021  6:29 AM EDT ----- Labs appear fairly stable other than slight rise in creatinine.  We will proceed with initiation of apixaban 5 mg twice daily and discontinuation of aspirin and clopidogrel, as discussed yesterday.  I encourage Mr. Dominica Severin to try to stay well-hydrated.  He should follow-up as planned in 3 weeks to reassess his atrial fibrillation.

## 2021-03-22 ENCOUNTER — Other Ambulatory Visit: Payer: Self-pay

## 2021-03-22 ENCOUNTER — Other Ambulatory Visit: Payer: Self-pay | Admitting: Family Medicine

## 2021-03-22 ENCOUNTER — Other Ambulatory Visit: Payer: Self-pay | Admitting: *Deleted

## 2021-03-22 MED ORDER — APIXABAN 5 MG PO TABS
5.0000 mg | ORAL_TABLET | Freq: Two times a day (BID) | ORAL | 0 refills | Status: DC
Start: 2021-03-22 — End: 2021-03-22

## 2021-03-22 MED ORDER — METOPROLOL SUCCINATE ER 25 MG PO TB24: 12.5000 mg | ORAL_TABLET | Freq: Every day | ORAL | 0 refills | Status: DC

## 2021-03-22 MED ORDER — APIXABAN 5 MG PO TABS
5.0000 mg | ORAL_TABLET | Freq: Two times a day (BID) | ORAL | 3 refills | Status: DC
Start: 2021-03-22 — End: 2021-04-29

## 2021-03-22 NOTE — Telephone Encounter (Signed)
Prescription refill request for Eliquis received. Indication: Afib  Last office visit: 03/20/21 (End)  Scr: 1.33 (03/20/21) Age: 70 Weight: 120.7kg  Appropriate dose and refill sent to requested pharmacy.

## 2021-03-22 NOTE — Telephone Encounter (Signed)
Requested Prescriptions  Pending Prescriptions Disp Refills  . amLODipine (NORVASC) 5 MG tablet [Pharmacy Med Name: AMLODIPINE BESYLATE TABS 5MG ] 90 tablet 1    Sig: TAKE 1 TABLET DAILY     Cardiovascular:  Calcium Channel Blockers Passed - 03/22/2021  6:27 AM      Passed - Last BP in normal range    BP Readings from Last 1 Encounters:  03/20/21 110/80         Passed - Valid encounter within last 6 months    Recent Outpatient Visits          3 months ago PFO (patent foramen ovale)   [facility] [place], Megan P, DO   4 months ago Mixed hyperlipidemia   [employer], [place], DO   5 months ago History of stroke   [employer], [place], DO   7 months ago Mixed hyperlipidemia   [employer], [place], DO   1 year ago Essential hypertension   [place], [place], DO      Future Appointments            In 6 days Wynetta Emery, Barb Merino, DO [employer], [place]   In 3 weeks End, Harrell Gave, MD [place], [place]   In 4 months  [employer], [place]   In 5 months McGowan, Hunt Oris, PA-C [facility]   In 12 months Ralene Bathe, MD [place]

## 2021-03-28 ENCOUNTER — Other Ambulatory Visit: Payer: Self-pay

## 2021-03-28 ENCOUNTER — Encounter: Payer: Self-pay | Admitting: Family Medicine

## 2021-03-28 ENCOUNTER — Ambulatory Visit (INDEPENDENT_AMBULATORY_CARE_PROVIDER_SITE_OTHER): Payer: Medicare Other | Admitting: Family Medicine

## 2021-03-28 VITALS — BP 110/75 | HR 121 | Temp 98.1°F | Wt 261.6 lb

## 2021-03-28 DIAGNOSIS — I4891 Unspecified atrial fibrillation: Secondary | ICD-10-CM | POA: Diagnosis not present

## 2021-03-28 DIAGNOSIS — E785 Hyperlipidemia, unspecified: Secondary | ICD-10-CM

## 2021-03-28 DIAGNOSIS — I639 Cerebral infarction, unspecified: Secondary | ICD-10-CM

## 2021-03-28 DIAGNOSIS — I10 Essential (primary) hypertension: Secondary | ICD-10-CM | POA: Diagnosis not present

## 2021-03-28 MED ORDER — ATORVASTATIN CALCIUM 80 MG PO TABS: 80.0000 mg | ORAL_TABLET | Freq: Every day | ORAL | 1 refills | Status: DC

## 2021-03-28 NOTE — Progress Notes (Signed)
BP 110/75   Pulse (!) 121   Temp 98.1 F (36.7 C)   Wt 261 lb 9.6 oz (118.7 kg)   SpO2 98%   BMI 32.70 kg/m    Subjective:    Patient ID: Zachary Rogers, male    DOB: 24-Jan-1951, 70 y.o.   MRN: [id number]  HPI: Zachary Rogers is a 70 y.o. male  Chief Complaint  Patient presents with   Hypertension   Hyperlipidemia   HYPERTENSION / [place] Satisfied with current treatment? yes Duration of hypertension: chronic BP monitoring frequency: not checking BP medication side effects: no Past BP meds: amlodipine, metoprolol Duration of hyperlipidemia: chronic Cholesterol medication side effects: no Cholesterol supplements: none Past cholesterol medications: atorvastatin Medication compliance: excellent compliance Aspirin: yes Recent stressors: no Recurrent headaches: no Visual changes: no Palpitations: no Dyspnea: no Chest pain: no Lower extremity edema: no Dizzy/lightheaded: no  Relevant past medical, surgical, family and social history reviewed and updated as indicated. Interim medical history since our last visit reviewed. Allergies and medications reviewed and updated.  Review of Systems  Constitutional: Negative.   Respiratory: Negative.    Cardiovascular: Negative.   Gastrointestinal: Negative.   Musculoskeletal: Negative.   Psychiatric/Behavioral: Negative.     Per HPI unless specifically indicated above     Objective:    BP 110/75   Pulse (!) 121   Temp 98.1 F (36.7 C)   Wt 261 lb 9.6 oz (118.7 kg)   SpO2 98%   BMI 32.70 kg/m   Wt Readings from Last 3 Encounters:  03/28/21 261 lb 9.6 oz (118.7 kg)  03/20/21 266 lb (120.7 kg)  02/13/21 260 lb (117.9 kg)    Physical Exam Vitals and nursing note reviewed.  Constitutional:      General: He is not in acute distress.    Appearance: Normal appearance. He is not ill-appearing, toxic-appearing or diaphoretic.  HENT:     Head: Normocephalic and atraumatic.     Right Ear: External ear  normal.     Left Ear: External ear normal.     Nose: Nose normal.     Mouth/Throat:     Mouth: Mucous membranes are moist.     Pharynx: Oropharynx is clear.  Eyes:     General: No scleral icterus.       Right eye: No discharge.        Left eye: No discharge.     Extraocular Movements: Extraocular movements intact.     Conjunctiva/sclera: Conjunctivae normal.     Pupils: Pupils are equal, round, and reactive to light.  Cardiovascular:     Rate and Rhythm: Normal rate and regular rhythm.     Pulses: Normal pulses.     Heart sounds: Normal heart sounds. No murmur heard.   No friction rub. No gallop.  Pulmonary:     Effort: Pulmonary effort is normal. No respiratory distress.     Breath sounds: Normal breath sounds. No stridor. No wheezing, rhonchi or rales.  Chest:     Chest wall: No tenderness.  Musculoskeletal:        General: Normal range of motion.     Cervical back: Normal range of motion and neck supple.  Skin:    General: Skin is warm and dry.     Capillary Refill: Capillary refill takes less than 2 seconds.     Coloration: Skin is not jaundiced or pale.     Findings: No bruising, erythema, lesion or rash.  Neurological:  General: No focal deficit present.     Mental Status: He is alert and oriented to person, place, and time. Mental status is at baseline.  Psychiatric:        Mood and Affect: Mood normal.        Behavior: Behavior normal.        Thought Content: Thought content normal.        Judgment: Judgment normal.    Results for orders placed or performed in visit on 03/20/21  CBC  Result Value Ref Range   WBC 5.3 3.4 - 10.8 x10E3/uL   RBC 4.93 4.14 - 5.80 x10E6/uL   Hemoglobin 14.8 13.0 - 17.7 g/dL   Hematocrit 43.2 37.5 - 51.0 %   MCV 88 79 - 97 fL   MCH 30.0 26.6 - 33.0 pg   MCHC 34.3 31.5 - 35.7 g/dL   RDW 13.3 11.6 - 15.4 %   Platelets 180 150 - 450 x10E3/uL  Comp Met (CMET)  Result Value Ref Range   Glucose 110 (H) 70 - 99 mg/dL   BUN 14 8 -  27 mg/dL   Creatinine, Ser 1.33 (H) 0.76 - 1.27 mg/dL   eGFR 57 (L) >59 mL/min/1.73   BUN/Creatinine Ratio 11 10 - 24   Sodium 142 134 - 144 mmol/L   Potassium 3.9 3.5 - 5.2 mmol/L   Chloride 107 (H) 96 - 106 mmol/L   CO2 23 20 - 29 mmol/L   Calcium 9.2 8.6 - 10.2 mg/dL   Total Protein 6.5 6.0 - 8.5 g/dL   Albumin 4.3 3.7 - 4.7 g/dL   Globulin, Total 2.2 1.5 - 4.5 g/dL   Albumin/Globulin Ratio 2.0 1.2 - 2.2   Bilirubin Total 2.2 (H) 0.0 - 1.2 mg/dL   Alkaline Phosphatase 109 44 - 121 IU/L   AST 25 0 - 40 IU/L   ALT 35 0 - 44 IU/L  Magnesium  Result Value Ref Range   Magnesium 2.0 1.6 - 2.3 mg/dL  TSH  Result Value Ref Range   TSH 1.250 0.450 - 4.500 uIU/mL      Assessment & Plan:   Problem List Items Addressed This Visit       Cardiovascular and Mediastinum   Essential hypertension - Primary    Under good control on current regimen. Continue current regimen. Continue to monitor. Call with any concerns. Refills up to date. Labs checked last visit at cardiology and was normal.        Relevant Medications   atorvastatin (LIPITOR) 80 MG tablet   Atrial fibrillation, new onset ([place])    New onset. Working with cardiology. Continue to monitor. Call with any concerns. Continue to monitor.       Relevant Medications   atorvastatin (LIPITOR) 80 MG tablet     Other   Hyperlipidemia LDL goal <70    Under good control on current regimen. Continue current regimen. Continue to monitor. Call with any concerns. Refills up to date. Will check cholesterol next visit.        Relevant Medications   atorvastatin (LIPITOR) 80 MG tablet     Follow up plan: Return in about 3 months (around 06/28/2021).

## 2021-03-28 NOTE — Assessment & Plan Note (Signed)
Under good control on current regimen. Continue current regimen. Continue to monitor. Call with any concerns. Refills up to date. Labs checked last visit at cardiology and was normal.

## 2021-03-28 NOTE — Assessment & Plan Note (Signed)
New onset. Working with cardiology. Continue to monitor. Call with any concerns. Continue to monitor.

## 2021-03-28 NOTE — Assessment & Plan Note (Signed)
Under good control on current regimen. Continue current regimen. Continue to monitor. Call with any concerns. Refills up to date. Will check cholesterol next visit.

## 2021-04-15 ENCOUNTER — Other Ambulatory Visit: Payer: Self-pay

## 2021-04-15 ENCOUNTER — Encounter: Payer: Self-pay | Admitting: Internal Medicine

## 2021-04-15 ENCOUNTER — Ambulatory Visit (INDEPENDENT_AMBULATORY_CARE_PROVIDER_SITE_OTHER): Payer: Medicare Other | Admitting: Internal Medicine

## 2021-04-15 VITALS — BP 120/70 | HR 97 | Ht 75.0 in | Wt 267.0 lb

## 2021-04-15 DIAGNOSIS — Z01812 Encounter for preprocedural laboratory examination: Secondary | ICD-10-CM

## 2021-04-15 DIAGNOSIS — Z01818 Encounter for other preprocedural examination: Secondary | ICD-10-CM

## 2021-04-15 DIAGNOSIS — I4819 Other persistent atrial fibrillation: Secondary | ICD-10-CM

## 2021-04-15 DIAGNOSIS — E785 Hyperlipidemia, unspecified: Secondary | ICD-10-CM

## 2021-04-15 DIAGNOSIS — I10 Essential (primary) hypertension: Secondary | ICD-10-CM | POA: Diagnosis not present

## 2021-04-15 DIAGNOSIS — Q2112 Patent foramen ovale: Secondary | ICD-10-CM | POA: Diagnosis not present

## 2021-04-15 NOTE — H&P (View-Only) (Signed)
Follow-up Outpatient Visit Date: 04/15/2021  Primary Care Provider: Valerie Roys, DO [place] [id number]  Chief Complaint: Follow-up atrial fibrillation  HPI:  [name] is a 70 y.o. male with history of stroke while traveling to [place] earlier this year and subsequent discovery of large PFO now status postclosure, hypertension, hyperlipidemia, and nephrolithiasis, who presents for follow-up of recently diagnosed atrial fibrillation.  I last saw him on 03/20/2021 for routine follow-up after his ASD closure.  He was feeling well at that time but was incidentally noted to be in atrial fibrillation with rapid ventricular response.  In the setting of a CHA2DS2-VASc score of 4, apixaban 5 mg twice daily was initiated with discontinuation of aspirin and clopidogrel following consultation with Dr. Burt Knack.  Today, [name] reports that he is feeling well.  His heart rates per his smart watch have been a little bit better and have dropped as low as the 60s to 70s at rest.  He denies chest pain, shortness of breath, palpitations, and dizziness.  He has noticed occasional dizziness if he moves his head while looking at something.  --------------------------------------------------------------------------------------------------  Cardiovascular History & Procedures: Cardiovascular Problems: Stroke Patent foramen ovale   Risk Factors: Hypertension, hyperlipidemia, male gender, tobacco use, obesity, and age greater than 33   Cath/PCI: None   CV Surgery: PFO closure (02/13/2021): 18 mm Amplatz atrial septal occluder   EP Procedures and Devices: 14-day event monitor (11/14/2020): Predominantly sinus rhythm with occasional PACs and rare PVCs as well as several brief runs of PSVT.  At least one episode of idioventricular rhythm was also noted.  No atrial fibrillation/flutter observed.   Non-Invasive Evaluation(s): Limited TTE (02/13/2021): Amplatz closure device in place with no  residual PFO.  Mild right atrial enlargement.  Mild mitral annular calcification and mitral valve thickening.  Trivial tricuspid regurgitation.  Aortic sclerosis. TEE (12/17/2020): Normal LV size.  LVEF 60-65%.  Normal RV size and function.  Normal biatrial size without thrombus.  Normal mitral valve with mild regurgitation.  Normal tricuspid valve with trivial regurgitation.  Tricuspid aortic valve with trivial regurgitation.  Normal pulmonic valve with mild regurgitation.  Minimal plaque in the thoracic aorta.  Atrial septal aneurysm with large PFO and positive bubble study noted. Carotid Doppler (11/13/2020): Mild right ICA stenosis.  Normal left carotid artery.  Atypical antegrade flow in both vertebral arteries.  Flow disturbance in right subclavian artery noted. TTE (11/06/2020): Mildly dilated LV with normal wall thickness.  Normal LVEF (50-55%) with normal wall motion.  Indeterminate diastolic parameters.  Normal RV size and function.  Normal biatrial size.  Trivial mitral and tricuspid regurgitation.  Positive bubble study.  Recent CV Pertinent Labs: Lab Results  Component Value Date   CHOL 114 11/05/2020   HDL 69 11/05/2020   LDLCALC 32 11/05/2020   TRIG 56 11/05/2020   K 3.9 03/20/2021   MG 2.0 03/20/2021   BUN 14 03/20/2021   CREATININE 1.33 (H) 03/20/2021    Past medical and surgical history were reviewed and updated in EPIC.  Current Meds  Medication Sig   amLODipine (NORVASC) 5 MG tablet TAKE 1 TABLET DAILY   amoxicillin (AMOXIL) 500 MG tablet Take 4 tablets (2,000 mg total) by mouth as directed. 1 hour prior to dental work including cleanings. No need to continue after 08/13/21   apixaban (ELIQUIS) 5 MG TABS tablet Take 1 tablet (5 mg total) by mouth 2 (two) times daily.   atorvastatin (LIPITOR) 80 MG tablet  Take 1 tablet (80 mg total) by mouth daily.   metoprolol succinate (TOPROL XL) 25 MG 24 hr tablet Take 0.5 tablets (12.5 mg total) by mouth daily.   Multiple Vitamin  (MULTIVITAMIN) capsule Take 1 capsule by mouth daily.   Omega-3 Fatty Acids (FISH OIL) 1000 MG CAPS Take 1,000 mg by mouth in the morning and at bedtime.    Allergies: Patient has no known allergies.  Social History   Tobacco Use   Smoking status: Former    Packs/day: 0.00    Years: 0.00    Pack years: 0.00    Types: Cigarettes    Quit date: 2000    Years since quitting: 22.9   Smokeless tobacco: Never   Tobacco comments:    social smoker when younger   Vaping Use   Vaping Use: Never used  Substance Use Topics   Alcohol use: Yes    Alcohol/week: 5.0 standard drinks    Types: 5 Glasses of wine per week   Drug use: No    Family History  Problem Relation Age of Onset   Lung cancer Mother    Hypertension Father    Diabetes Father    Transient ischemic attack Father    Heart attack Maternal Grandfather    Prostate cancer Neg Hx    Bladder Cancer Neg Hx    Kidney cancer Neg Hx     Review of Systems: A 12-system review of systems was performed and was negative except as noted in the HPI.  --------------------------------------------------------------------------------------------------  Physical Exam: BP 120/70 (BP Location: Left Arm, Patient Position: Sitting, Cuff Size: Large)   Pulse 97   Ht 6\' 3"  (1.905 m)   Wt 267 lb (121.1 kg)   SpO2 98%   BMI 33.37 kg/m   General:  NAD. Neck: No JVD or HJR. Lungs: Clear to auscultation bilaterally without wheezes or crackles. Heart: Irregularly irregular rhythm without murmurs. Abdomen: Soft, nontender, nondistended. Extremities: No lower extremity edema.  EKG: Atrial fibrillation (ventricular rate 97 bpm) with left anterior fascicular block.  Ventricular rate has decreased since 03/20/2021.  Otherwise, no significant interval change.  Lab Results  Component Value Date   WBC 5.3 03/20/2021   HGB 14.8 03/20/2021   HCT 43.2 03/20/2021   MCV 88 03/20/2021   PLT 180 03/20/2021    Lab Results  Component Value Date    NA 142 03/20/2021   K 3.9 03/20/2021   CL 107 (H) 03/20/2021   CO2 23 03/20/2021   BUN 14 03/20/2021   CREATININE 1.33 (H) 03/20/2021   GLUCOSE 110 (H) 03/20/2021   ALT 35 03/20/2021    Lab Results  Component Value Date   CHOL 114 11/05/2020   HDL 69 11/05/2020   LDLCALC 32 11/05/2020   TRIG 56 11/05/2020    --------------------------------------------------------------------------------------------------  ASSESSMENT AND PLAN: Persistent atrial fibrillation: [name] remains in atrial fibrillation without symptoms.  Ventricular rate control improved with low-dose metoprolol.  We will continue current dose of metoprolol as well as anticoagulation with apixaban 5 mg twice daily.  He has been taking this since our last visit without missing a dose.  We have discussed cardioversion and we will move forward with this early next month when his wife is back in town.  We will draw a BMP and CBC a few days before the cardioversion.  PFO: Continue apixaban and lieu of antiplatelet therapy based on discussions with Dr. Burt Knack.  SBE prophylaxis recommended.  Hypertension: Blood pressure adequately controlled.  Hyperlipidemia: LDL  very well controlled.  Continue statin therapy for secondary prevention.  Shared Decision Making/Informed Consent The risks (stroke, cardiac arrhythmias rarely resulting in the need for a temporary or permanent pacemaker, skin irritation or burns and complications associated with conscious sedation including aspiration, arrhythmia, respiratory failure and death), benefits (restoration of normal sinus rhythm) and alternatives of a direct current cardioversion were explained in detail to [name] and he agrees to proceed.   Follow-up: Return to clinic in 4 weeks.  Nelva Bush, MD 04/15/2021 1:25 PM

## 2021-04-15 NOTE — Patient Instructions (Signed)
Medication Instructions:   Your physician recommends that you continue on your current medications as directed. Please refer to the Current Medication list given to you today.  *If you need a refill on your cardiac medications before your next appointment, please call your pharmacy*    Testing/Procedures:  You are scheduled for a Cardioversion on Tuesday 04/30/21 with Dr. Saunders Revel.  Please arrive at the [place] [facility] at 6:30 a.m. on the day of your procedure.  DIET INSTRUCTIONS:   Nothing to eat or drink after midnight except your medications with a sip of water.      Labs: To be completed at [facility] either Friday 04/26/21 or Monday 04/29/21 (BMET, CBC)  -  Please go to the [place] will check in at the front desk to the right as you walk into the atrium.  -  Valet Parking is offered if needed. -  No appointment needed. You may go any day between 7 am and 6 pm.   Medications:  YOU MAY TAKE ALL of your remaining medications with a small amount of water.  Must have a responsible person to drive you home.  Bring a current list of your medications and current insurance cards.    If you have any questions after you get home, please call the office at [phone]    Follow-Up: At [facility], you and your health needs are our priority.  As part of our continuing mission to provide you with exceptional heart care, we have created designated Provider Care Teams.  These Care Teams include your primary Cardiologist (physician) and Advanced Practice Providers (APPs -  Physician Assistants and Nurse Practitioners) who all work together to provide you with the care you need, when you need it.  We recommend signing up for the patient portal called "MyChart".  Sign up information is provided on this After Visit Summary.  MyChart is used to connect with patients for Virtual Visits (Telemedicine).  Patients are able to view lab/test results, encounter notes, upcoming  appointments, etc.  Non-urgent messages can be sent to your provider as well.   To learn more about what you can do with MyChart, go to [email or web address].    Your next appointment:    2 weeks after Cardioversion procedure  The format for your next appointment:   In Person  Provider:   You may see Nelva Bush, MD or one of the following Advanced Practice Providers on your designated Care Team:   Murray Hodgkins, NP Christell Faith, PA-C Cadence Kathlen Mody, [place]

## 2021-04-15 NOTE — Progress Notes (Signed)
Follow-up Outpatient Visit Date: 04/15/2021  Primary Care Provider: Valerie Roys, DO [place] [address]  Chief Complaint: Follow-up atrial fibrillation  HPI:  Mr. Nayden Czajka is a 70 y.o. male with history of stroke while traveling to [place] earlier this year and subsequent discovery of large PFO now status postclosure, hypertension, hyperlipidemia, and nephrolithiasis, who presents for follow-up of recently diagnosed atrial fibrillation.  I last saw him on 03/20/2021 for routine follow-up after his ASD closure.  He was feeling well at that time but was incidentally noted to be in atrial fibrillation with rapid ventricular response.  In the setting of a CHA2DS2-VASc score of 4, apixaban 5 mg twice daily was initiated with discontinuation of aspirin and clopidogrel following consultation with Dr. Burt Knack.  Today, Mr. Dominica Severin reports that he is feeling well.  His heart rates per his smart watch have been a little bit better and have dropped as low as the 60s to 70s at rest.  He denies chest pain, shortness of breath, palpitations, and dizziness.  He has noticed occasional dizziness if he moves his head while looking at something.  --------------------------------------------------------------------------------------------------  Cardiovascular History & Procedures: Cardiovascular Problems: Stroke Patent foramen ovale   Risk Factors: Hypertension, hyperlipidemia, male gender, tobacco use, obesity, and age greater than 75   Cath/PCI: None   CV Surgery: PFO closure (02/13/2021): 18 mm Amplatz atrial septal occluder   EP Procedures and Devices: 14-day event monitor (11/14/2020): Predominantly sinus rhythm with occasional PACs and rare PVCs as well as several brief runs of PSVT.  At least one episode of idioventricular rhythm was also noted.  No atrial fibrillation/flutter observed.   Non-Invasive Evaluation(s): Limited TTE (02/13/2021): Amplatz closure device in place with no  residual PFO.  Mild right atrial enlargement.  Mild mitral annular calcification and mitral valve thickening.  Trivial tricuspid regurgitation.  Aortic sclerosis. TEE (12/17/2020): Normal LV size.  LVEF 60-65%.  Normal RV size and function.  Normal biatrial size without thrombus.  Normal mitral valve with mild regurgitation.  Normal tricuspid valve with trivial regurgitation.  Tricuspid aortic valve with trivial regurgitation.  Normal pulmonic valve with mild regurgitation.  Minimal plaque in the thoracic aorta.  Atrial septal aneurysm with large PFO and positive bubble study noted. Carotid Doppler (11/13/2020): Mild right ICA stenosis.  Normal left carotid artery.  Atypical antegrade flow in both vertebral arteries.  Flow disturbance in right subclavian artery noted. TTE (11/06/2020): Mildly dilated LV with normal wall thickness.  Normal LVEF (50-55%) with normal wall motion.  Indeterminate diastolic parameters.  Normal RV size and function.  Normal biatrial size.  Trivial mitral and tricuspid regurgitation.  Positive bubble study.  Recent CV Pertinent Labs: Lab Results  Component Value Date   CHOL 114 11/05/2020   HDL 69 11/05/2020   LDLCALC 32 11/05/2020   TRIG 56 11/05/2020   K 3.9 03/20/2021   MG 2.0 03/20/2021   BUN 14 03/20/2021   CREATININE 1.33 (H) 03/20/2021    Past medical and surgical history were reviewed and updated in EPIC.  Current Meds  Medication Sig   amLODipine (NORVASC) 5 MG tablet TAKE 1 TABLET DAILY   amoxicillin (AMOXIL) 500 MG tablet Take 4 tablets (2,000 mg total) by mouth as directed. 1 hour prior to dental work including cleanings. No need to continue after 08/13/21   apixaban (ELIQUIS) 5 MG TABS tablet Take 1 tablet (5 mg total) by mouth 2 (two) times daily.   atorvastatin (LIPITOR) 80 MG tablet  Take 1 tablet (80 mg total) by mouth daily.   metoprolol succinate (TOPROL XL) 25 MG 24 hr tablet Take 0.5 tablets (12.5 mg total) by mouth daily.   Multiple Vitamin  (MULTIVITAMIN) capsule Take 1 capsule by mouth daily.   Omega-3 Fatty Acids (FISH OIL) 1000 MG CAPS Take 1,000 mg by mouth in the morning and at bedtime.    Allergies: Patient has no known allergies.  Social History   Tobacco Use   Smoking status: Former    Packs/day: 0.00    Years: 0.00    Pack years: 0.00    Types: Cigarettes    Quit date: 2000    Years since quitting: 22.9   Smokeless tobacco: Never   Tobacco comments:    social smoker when younger   Vaping Use   Vaping Use: Never used  Substance Use Topics   Alcohol use: Yes    Alcohol/week: 5.0 standard drinks    Types: 5 Glasses of wine per week   Drug use: No    Family History  Problem Relation Age of Onset   Lung cancer Mother    Hypertension Father    Diabetes Father    Transient ischemic attack Father    Heart attack Maternal Grandfather    Prostate cancer Neg Hx    Bladder Cancer Neg Hx    Kidney cancer Neg Hx     Review of Systems: A 12-system review of systems was performed and was negative except as noted in the HPI.  --------------------------------------------------------------------------------------------------  Physical Exam: BP 120/70 (BP Location: Left Arm, Patient Position: Sitting, Cuff Size: Large)   Pulse 97   Ht 6\' 3"  (1.905 m)   Wt 267 lb (121.1 kg)   SpO2 98%   BMI 33.37 kg/m   General:  NAD. Neck: No JVD or HJR. Lungs: Clear to auscultation bilaterally without wheezes or crackles. Heart: Irregularly irregular rhythm without murmurs. Abdomen: Soft, nontender, nondistended. Extremities: No lower extremity edema.  EKG: Atrial fibrillation (ventricular rate 97 bpm) with left anterior fascicular block.  Ventricular rate has decreased since 03/20/2021.  Otherwise, no significant interval change.  Lab Results  Component Value Date   WBC 5.3 03/20/2021   HGB 14.8 03/20/2021   HCT 43.2 03/20/2021   MCV 88 03/20/2021   PLT 180 03/20/2021    Lab Results  Component Value Date    NA 142 03/20/2021   K 3.9 03/20/2021   CL 107 (H) 03/20/2021   CO2 23 03/20/2021   BUN 14 03/20/2021   CREATININE 1.33 (H) 03/20/2021   GLUCOSE 110 (H) 03/20/2021   ALT 35 03/20/2021    Lab Results  Component Value Date   CHOL 114 11/05/2020   HDL 69 11/05/2020   LDLCALC 32 11/05/2020   TRIG 56 11/05/2020    --------------------------------------------------------------------------------------------------  ASSESSMENT AND PLAN: Persistent atrial fibrillation: Mr. Dominica Severin remains in atrial fibrillation without symptoms.  Ventricular rate control improved with low-dose metoprolol.  We will continue current dose of metoprolol as well as anticoagulation with apixaban 5 mg twice daily.  He has been taking this since our last visit without missing a dose.  We have discussed cardioversion and we will move forward with this early next month when his wife is back in town.  We will draw a BMP and CBC a few days before the cardioversion.  PFO: Continue apixaban and lieu of antiplatelet therapy based on discussions with Dr. Burt Knack.  SBE prophylaxis recommended.  Hypertension: Blood pressure adequately controlled.  Hyperlipidemia: LDL  very well controlled.  Continue statin therapy for secondary prevention.  Shared Decision Making/Informed Consent The risks (stroke, cardiac arrhythmias rarely resulting in the need for a temporary or permanent pacemaker, skin irritation or burns and complications associated with conscious sedation including aspiration, arrhythmia, respiratory failure and death), benefits (restoration of normal sinus rhythm) and alternatives of a direct current cardioversion were explained in detail to Mr. Thatcher Doberstein and he agrees to proceed.   Follow-up: Return to clinic in 4 weeks.  Nelva Bush, MD 04/15/2021 1:25 PM

## 2021-04-27 ENCOUNTER — Other Ambulatory Visit: Payer: Self-pay | Admitting: Internal Medicine

## 2021-04-29 ENCOUNTER — Other Ambulatory Visit: Payer: Self-pay | Admitting: Internal Medicine

## 2021-04-29 ENCOUNTER — Other Ambulatory Visit
Admission: RE | Admit: 2021-04-29 | Discharge: 2021-04-29 | Disposition: A | Discharge status: Home / Self Care | Payer: Medicare Other | Attending: Internal Medicine | Admitting: Internal Medicine

## 2021-04-29 DIAGNOSIS — Z01812 Encounter for preprocedural laboratory examination: Secondary | ICD-10-CM | POA: Insufficient documentation

## 2021-04-29 DIAGNOSIS — I4819 Other persistent atrial fibrillation: Secondary | ICD-10-CM

## 2021-04-29 LAB — BASIC METABOLIC PANEL
Anion gap: 6 (ref 5–15)
BUN: 16 mg/dL (ref 8–23)
CO2: 24 mmol/L (ref 22–32)
Calcium: 9.3 mg/dL (ref 8.9–10.3)
Chloride: 109 mmol/L (ref 98–111)
Creatinine, Ser: 1.1 mg/dL (ref 0.61–1.24)
GFR, Estimated: >60 mL/min (ref >60)
Glucose, Bld: 115 mg/dL — ABNORMAL HIGH (ref 70–99)
Potassium: 3.8 mmol/L (ref 3.5–5.1)
Sodium: 139 mmol/L (ref 135–145)

## 2021-04-29 LAB — CBC
HCT: 42.2 % (ref 39.0–52.0)
Hemoglobin: 14.2 g/dL (ref 13.0–17.0)
MCH: 29.9 pg (ref 26.0–34.0)
MCHC: 33.6 g/dL (ref 30.0–36.0)
MCV: 88.8 fL (ref 80.0–100.0)
Platelets: 168 10*3/uL (ref 150–400)
RBC: 4.75 MIL/uL (ref 4.22–5.81)
RDW: 13.9 % (ref 11.5–15.5)
WBC: 8.1 10*3/uL (ref 4.0–10.5)
nRBC: 0 % (ref 0.0–0.2)

## 2021-04-29 NOTE — Telephone Encounter (Signed)
Eliquis 5 mg refill request received. Patient is 70 years old, weight- 121.1 kg, Crea- 1.33 on 03/20/21, Diagnosis- afib, and last seen by Dr. Saunders Revel on 04/15/21. Dose is appropriate based on dosing criteria. Will send in refill to requested pharmacy.

## 2021-04-29 NOTE — Telephone Encounter (Signed)
Refill Request.  

## 2021-04-30 ENCOUNTER — Encounter
Admission: RE | Disposition: A | Discharge status: Home / Self Care | Payer: Self-pay | Source: Ambulatory Visit | Attending: Internal Medicine

## 2021-04-30 ENCOUNTER — Ambulatory Visit: Payer: Medicare Other | Admitting: Certified Registered"

## 2021-04-30 ENCOUNTER — Encounter: Payer: Self-pay | Admitting: Internal Medicine

## 2021-04-30 ENCOUNTER — Ambulatory Visit
Admission: RE | Admit: 2021-04-30 | Discharge: 2021-04-30 | Disposition: A | Discharge status: Home / Self Care | Payer: Medicare Other | Source: Ambulatory Visit | Attending: Internal Medicine | Admitting: Internal Medicine

## 2021-04-30 DIAGNOSIS — I4819 Other persistent atrial fibrillation: Secondary | ICD-10-CM | POA: Insufficient documentation

## 2021-04-30 DIAGNOSIS — Z8774 Personal history of (corrected) congenital malformations of heart and circulatory system: Secondary | ICD-10-CM | POA: Diagnosis not present

## 2021-04-30 DIAGNOSIS — I4891 Unspecified atrial fibrillation: Secondary | ICD-10-CM | POA: Diagnosis not present

## 2021-04-30 DIAGNOSIS — I10 Essential (primary) hypertension: Secondary | ICD-10-CM | POA: Diagnosis not present

## 2021-04-30 DIAGNOSIS — Z7901 Long term (current) use of anticoagulants: Secondary | ICD-10-CM | POA: Insufficient documentation

## 2021-04-30 DIAGNOSIS — Z79899 Other long term (current) drug therapy: Secondary | ICD-10-CM | POA: Insufficient documentation

## 2021-04-30 DIAGNOSIS — Z8673 Personal history of transient ischemic attack (TIA), and cerebral infarction without residual deficits: Secondary | ICD-10-CM | POA: Insufficient documentation

## 2021-04-30 DIAGNOSIS — E785 Hyperlipidemia, unspecified: Secondary | ICD-10-CM | POA: Diagnosis not present

## 2021-04-30 HISTORY — PX: CARDIOVERSION: SHX1299

## 2021-04-30 SURGERY — CARDIOVERSION
Anesthesia: General

## 2021-04-30 MED ORDER — PROPOFOL 10 MG/ML IV BOLUS
INTRAVENOUS | Status: DC | PRN
  Administered 2021-04-30: 50 mg via INTRAVENOUS

## 2021-04-30 MED ORDER — PROPOFOL 10 MG/ML IV BOLUS
INTRAVENOUS | Status: AC
  Filled 2021-04-30: qty 20

## 2021-04-30 MED ORDER — SODIUM CHLORIDE 0.9 % IV SOLN
INTRAVENOUS | Status: DC
  Administered 2021-04-30: 1000 mL via INTRAVENOUS

## 2021-04-30 NOTE — CV Procedure (Signed)
    Cardioversion Note  Caine Barfield [id number] 1950-10-22  Procedure: DC Cardioversion Indications: Persistent atrial fibrillation  Procedure Details Consent: Obtained Time Out: Verified patient identification, verified procedure, site/side was marked, verified correct patient position, special equipment/implants available, Radiology Safety Procedures followed,  medications/allergies/relevent history reviewed, required imaging and test results available.  Performed  The patient has been on adequate anticoagulation.  The patient received IV propofol by anesthesia services for sedation.  Synchronous cardioversion was performed at 200 joules x 1.  The cardioversion was successful with restoration of sinus rhythm.  Complications: No apparent complications Patient did tolerate procedure well.  Recommenations: - Discontinue metoprolol due to sinus bradycardia post-cardioversion - Continue apixaban 5 mg BID. - Follow-up as planned with me on 05/31/2020.  Nelva Bush., MD 04/30/2021, 7:35 AM

## 2021-04-30 NOTE — Anesthesia Postprocedure Evaluation (Signed)
Anesthesia Post Note  Patient: Zebastian Carico  Procedure(s) Performed: CARDIOVERSION  Patient location during evaluation: Cath Lab Anesthesia Type: General Level of consciousness: awake and alert Pain management: pain level controlled Vital Signs Assessment: post-procedure vital signs reviewed and stable Respiratory status: spontaneous breathing, nonlabored ventilation, respiratory function stable and patient connected to nasal cannula oxygen Cardiovascular status: blood pressure returned to baseline and stable Postop Assessment: no apparent nausea or vomiting Anesthetic complications: no   No notable events documented.   Last Vitals:  Vitals:   04/30/21 0745 04/30/21 0800  BP: 98/67 105/78  Pulse: (!) 48 (!) 57  Resp: 15 16  Temp:    SpO2: 95% 95%    Last Pain:  Vitals:   04/30/21 0800  TempSrc:   PainSc: 0-No pain                 Precious Haws Zoey Gilkeson

## 2021-04-30 NOTE — Transfer of Care (Signed)
Immediate Anesthesia Transfer of Care Note  Patient: Zachary Rogers  Procedure(s) Performed: CARDIOVERSION  Patient Location: PACU and Cath Lab  Anesthesia Type:General  Level of Consciousness: drowsy  Airway & Oxygen Therapy: Patient Spontanous Breathing and Patient connected to nasal cannula oxygen  Post-op Assessment: Report given to RN  Post vital signs: stable  Last Vitals:  Vitals Value Taken Time  BP 128/93 04/30/21 0730  Temp    Pulse 55 04/30/21 0734  Resp 13 04/30/21 0734  SpO2 93 % 04/30/21 0734    Last Pain:  Vitals:   04/30/21 0709  TempSrc: Oral  PainSc: 0-No pain         Complications: No notable events documented.

## 2021-04-30 NOTE — Anesthesia Preprocedure Evaluation (Signed)
Anesthesia Evaluation  Patient identified by MRN, date of birth, ID band Patient awake    Reviewed: Allergy & Precautions, NPO status , Patient's Chart, lab work & pertinent test results  History of Anesthesia Complications Negative for: history of anesthetic complications  Airway Mallampati: III  TM Distance: >3 FB Neck ROM: full    Dental  (+) Chipped Braces:   Pulmonary neg shortness of breath, former smoker,    Pulmonary exam normal        Cardiovascular Exercise Tolerance: Good hypertension, (-) angina+ CAD  (-) Past MI Normal cardiovascular exam     Neuro/Psych CVA negative psych ROS   GI/Hepatic negative GI ROS, Neg liver ROS, neg GERD  ,  Endo/Other  negative endocrine ROS  Renal/GU Renal disease  negative genitourinary   Musculoskeletal  (+) Arthritis ,   Abdominal   Peds  Hematology negative hematology ROS (+)   Anesthesia Other Findings Past Medical History: No date: Actinic keratosis     Comment:  R lat neck - bx proven with verruca vulgaris 10/25/2020: Cerebrovascular accident (CVA) ([place]) 06/22/2017: Dysplastic nevus     Comment:  L mid back parapinal -severe, excision 10/06/2017 No date: Hypertension 09/04/2020: Lateral epicondylitis (tennis elbow) 07/18/2019: Mixed hyperlipidemia No date: Nephrolithiasis  Past Surgical History: 12/17/2020: BUBBLE STUDY     Comment:  Procedure: BUBBLE STUDY;  Surgeon: Fay Records, MD;                Location: [facility];  Service: Cardiovascular;; 06/07/1998: CHOLECYSTECTOMY No date: COLONOSCOPY 11/09/2018: COLONOSCOPY WITH PROPOFOL; N/A     Comment:  Procedure: COLONOSCOPY WITH PROPOFOL;  Surgeon: [name], MD;  Location: [facility] ENDOSCOPY;  Service:               Gastroenterology;  Laterality: N/A; 02/13/2021: PATENT FORAMEN OVALE(PFO) CLOSURE; N/A     Comment:  Procedure: PATENT FORAMEN OVALE (PFO) CLOSURE;  Surgeon:              Sherren Mocha, MD;  Location: [facility];                Service: Cardiovascular;  Laterality: N/A; No date: SKIN LESION EXCISION     Comment:  dermatlogy - precancerous lesion 1 year ago on back  12/17/2020: TEE WITHOUT CARDIOVERSION; N/A     Comment:  Procedure: TRANSESOPHAGEAL ECHOCARDIOGRAM (TEE);                Surgeon: Fay Records, MD;  Location: [facility] ENDOSCOPY;                Service: Cardiovascular;  Laterality: N/A;  BMI    Body Mass Index: 33.12 kg/m      Reproductive/Obstetrics negative OB ROS                             Anesthesia Physical Anesthesia Plan  ASA: 3  Anesthesia Plan: General   Post-op Pain Management:    Induction: Intravenous  PONV Risk Score and Plan: Propofol infusion and TIVA  Airway Management Planned: Natural Airway and Nasal Cannula  Additional Equipment:   Intra-op Plan:   Post-operative Plan:   Informed Consent: I have reviewed the patients History and Physical, chart, labs and discussed the procedure including the risks, benefits and alternatives for the proposed anesthesia with the patient or authorized representative who has indicated  his/her understanding and acceptance.     Dental Advisory Given  Plan Discussed with: Anesthesiologist, CRNA and Surgeon  Anesthesia Plan Comments: (Patient consented for risks of anesthesia including but not limited to:  - adverse reactions to medications - risk of airway placement if required - damage to eyes, teeth, lips or other oral mucosa - nerve damage due to positioning  - sore throat or hoarseness - Damage to heart, brain, nerves, lungs, other parts of body or loss of life  Patient voiced understanding.)        Anesthesia Quick Evaluation

## 2021-04-30 NOTE — Interval H&P Note (Signed)
History and Physical Interval Note:  04/30/2021 7:29 AM  Zachary Rogers  has presented today for surgery, with the diagnosis of persistent atrial fibrillation.  The various methods of treatment have been discussed with the patient and family. After consideration of risks, benefits and other options for treatment, the patient has consented to  Procedure(s): CARDIOVERSION (N/A) as a surgical intervention.  The patient's history has been reviewed, patient examined, no change in status, stable for surgery.  I have reviewed the patient's chart and labs.  Questions were answered to the patient's satisfaction.     [name]

## 2021-05-31 ENCOUNTER — Other Ambulatory Visit: Payer: Self-pay

## 2021-05-31 ENCOUNTER — Encounter: Payer: Self-pay | Admitting: Internal Medicine

## 2021-05-31 ENCOUNTER — Ambulatory Visit (INDEPENDENT_AMBULATORY_CARE_PROVIDER_SITE_OTHER): Payer: Medicare Other | Admitting: Internal Medicine

## 2021-05-31 VITALS — BP 116/74 | HR 46 | Ht 75.0 in | Wt 263.0 lb

## 2021-05-31 DIAGNOSIS — I4819 Other persistent atrial fibrillation: Secondary | ICD-10-CM | POA: Diagnosis not present

## 2021-05-31 DIAGNOSIS — Z8673 Personal history of transient ischemic attack (TIA), and cerebral infarction without residual deficits: Secondary | ICD-10-CM

## 2021-05-31 DIAGNOSIS — Q2112 Patent foramen ovale: Secondary | ICD-10-CM | POA: Diagnosis not present

## 2021-05-31 NOTE — Progress Notes (Signed)
Follow-up Outpatient Visit Date: 05/31/2021  Primary Care Provider: Valerie Roys, DO [place] [id number]  Chief Complaint: Follow-up stroke, PFO, and atrial fibrillation  HPI:  Zachary Rogers is a 71 y.o. male with history of stroke while traveling to [place] earlier this year and subsequent discovery of large PFO now status post closure, recently diagnosed atrial fibrilation status post cardioversion last month, hypertension, hyperlipidemia, and nephrolithiasis, who presents for follow-up of atrial fibrillation.  I last saw him in late November, at which time he was still in atrial fibrillation.  He underwent cardioversion in mid December with restoration of sinus rhythm.  Today, Zachary Rogers reports that he is feeling well.  He is sleeping better following his cardioversion.  He denies chest pain, shortness of breath, palpitations, lightheadedness, edema, and bleeding.  Resting heart rate is back down to the upper 40s and lower 50s, which he has had for many years.  He is able to exercise with heart rates up to the 120s.  --------------------------------------------------------------------------------------------------  Cardiovascular History & Procedures: Cardiovascular Problems: Stroke Patent foramen ovale   Risk Factors: Hypertension, hyperlipidemia, male gender, tobacco use, obesity, and age greater than 3   Cath/PCI: None   CV Surgery: PFO closure (02/13/2021): 18 mm Amplatz atrial septal occluder   EP Procedures and Devices: DCCV (04/30/2021) 14-day event monitor (11/14/2020): Predominantly sinus rhythm with occasional PACs and rare PVCs as well as several brief runs of PSVT.  At least one episode of idioventricular rhythm was also noted.  No atrial fibrillation/flutter observed.   Non-Invasive Evaluation(s): Limited TTE (02/13/2021): Amplatz closure device in place with no residual PFO.  Mild right atrial enlargement.  Mild mitral annular calcification and mitral  valve thickening.  Trivial tricuspid regurgitation.  Aortic sclerosis. TEE (12/17/2020): Normal LV size.  LVEF 60-65%.  Normal RV size and function.  Normal biatrial size without thrombus.  Normal mitral valve with mild regurgitation.  Normal tricuspid valve with trivial regurgitation.  Tricuspid aortic valve with trivial regurgitation.  Normal pulmonic valve with mild regurgitation.  Minimal plaque in the thoracic aorta.  Atrial septal aneurysm with large PFO and positive bubble study noted. Carotid Doppler (11/13/2020): Mild right ICA stenosis.  Normal left carotid artery.  Atypical antegrade flow in both vertebral arteries.  Flow disturbance in right subclavian artery noted. TTE (11/06/2020): Mildly dilated LV with normal wall thickness.  Normal LVEF (50-55%) with normal wall motion.  Indeterminate diastolic parameters.  Normal RV size and function.  Normal biatrial size.  Trivial mitral and tricuspid regurgitation.  Positive bubble study.  Recent CV Pertinent Labs: Lab Results  Component Value Date   CHOL 114 11/05/2020   HDL 69 11/05/2020   LDLCALC 32 11/05/2020   TRIG 56 11/05/2020   K 3.8 04/29/2021   MG 2.0 03/20/2021   BUN 16 04/29/2021   BUN 14 03/20/2021   CREATININE 1.10 04/29/2021    Past medical and surgical history were reviewed and updated in EPIC.  Current Meds  Medication Sig   amLODipine (NORVASC) 5 MG tablet TAKE 1 TABLET DAILY   amoxicillin (AMOXIL) 500 MG tablet Take 4 tablets (2,000 mg total) by mouth as directed. 1 hour prior to dental work including cleanings. No need to continue after 08/13/21   apixaban (ELIQUIS) 5 MG TABS tablet TAKE 1 TABLET(5 MG) BY MOUTH TWICE DAILY   atorvastatin (LIPITOR) 80 MG tablet Take 1 tablet (80 mg total) by mouth daily.   Multiple Vitamin (MULTIVITAMIN) capsule Take 1 capsule  by mouth daily.   Omega-3 Fatty Acids (FISH OIL) 1000 MG CAPS Take 1,000 mg by mouth daily.    Allergies: Patient has no known allergies.  Social History    Tobacco Use   Smoking status: Former    Packs/day: 0.00    Years: 0.00    Pack years: 0.00    Types: Cigarettes    Quit date: 2000    Years since quitting: 23.0   Smokeless tobacco: Never   Tobacco comments:    social smoker when younger   Vaping Use   Vaping Use: Never used  Substance Use Topics   Alcohol use: Yes    Alcohol/week: 5.0 standard drinks    Types: 5 Glasses of wine per week   Drug use: No    Family History  Problem Relation Age of Onset   Lung cancer Mother    Hypertension Father    Diabetes Father    Transient ischemic attack Father    Heart attack Maternal Grandfather    Prostate cancer Neg Hx    Bladder Cancer Neg Hx    Kidney cancer Neg Hx     Review of Systems: A 12-system review of systems was performed and was negative except as noted in the HPI.  --------------------------------------------------------------------------------------------------  Physical Exam: BP 116/74 (BP Location: Left Arm, Patient Position: Sitting, Cuff Size: Large)    Pulse (!) 46    Ht 6\' 3"  (1.905 m)    Wt 263 lb (119.3 kg)    SpO2 98%    BMI 32.87 kg/m   General:  NAD. Neck: No JVD or HJR. Lungs: Clear to auscultation bilaterally without wheezes or crackles. Heart: Bradycardic but regular without murmurs, rubs, or gallops. Abdomen: Soft, nontender, nondistended. Extremities: No lower extremity edema.  EKG: Sinus bradycardia with sinus arrhythmia and left axis deviation.  Compared with prior tracing from 04/30/2021, sinus bradycardia has replaced atrial fibrillation.  Lab Results  Component Value Date   WBC 8.1 04/29/2021   HGB 14.2 04/29/2021   HCT 42.2 04/29/2021   MCV 88.8 04/29/2021   PLT 168 04/29/2021    Lab Results  Component Value Date   NA 139 04/29/2021   K 3.8 04/29/2021   CL 109 04/29/2021   CO2 24 04/29/2021   BUN 16 04/29/2021   CREATININE 1.10 04/29/2021   GLUCOSE 115 (H) 04/29/2021   ALT 35 03/20/2021    Lab Results  Component  Value Date   CHOL 114 11/05/2020   HDL 69 11/05/2020   LDLCALC 32 11/05/2020   TRIG 56 11/05/2020    --------------------------------------------------------------------------------------------------  ASSESSMENT AND PLAN: Persistent atrial fibrillation: Zachary Rogers is maintaining sinus rhythm following cardioversion last month.  My hope is that as he moves farther out from PFO closure last fall, that risk for recurrent atrial fibrillation will be lower.  Given history of stroke, I am still reluctant to discontinue apixaban at this time, though we suspect his atrial fibrillation was related to PFO closure.  We will defer medication changes for now, continuing apixaban 5 mg twice daily.  We will readdress duration of anticoagulation at follow-up.  Zachary Rogers will continuing monitoring his heart rate and rhythm with his smart watch.  History of stroke and PFO status postclosure: No new focal neurologic deficits.  Continue current medications for secondary prevention.  We discussed de-escalation of atorvastatin, though Zachary Rogers wishes to see what his LDL looks like when he follows with his PCP at their next visit.  If LDL remains quite  low (below 50), de-escalation of atorvastatin should be considered to minimize risk for adverse effects.  Follow-up: Return to clinic in 6 months.  Nelva Bush, MD 05/31/2021 8:20 AM

## 2021-05-31 NOTE — Patient Instructions (Signed)
Medication Instructions:   Your physician recommends that you continue on your current medications as directed. Please refer to the Current Medication list given to you today.  *If you need a refill on your cardiac medications before your next appointment, please call your pharmacy*   Lab Work:  None ordered  Testing/Procedures:  None ordered   Follow-Up: At [facility], you and your health needs are our priority.  As part of our continuing mission to provide you with exceptional heart care, we have created designated Provider Care Teams.  These Care Teams include your primary Cardiologist (physician) and Advanced Practice Providers (APPs -  Physician Assistants and Nurse Practitioners) who all work together to provide you with the care you need, when you need it.  We recommend signing up for the patient portal called "MyChart".  Sign up information is provided on this After Visit Summary.  MyChart is used to connect with patients for Virtual Visits (Telemedicine).  Patients are able to view lab/test results, encounter notes, upcoming appointments, etc.  Non-urgent messages can be sent to your provider as well.   To learn more about what you can do with MyChart, go to [email or web address].    Your next appointment:   6 month(s)  The format for your next appointment:   In Person  Provider:   You may see Nelva Bush, MD or one of the following Advanced Practice Providers on your designated Care Team:   Murray Hodgkins, NP Christell Faith, PA-C Cadence Kathlen Mody, [place]

## 2021-06-28 ENCOUNTER — Other Ambulatory Visit: Payer: Self-pay

## 2021-06-28 ENCOUNTER — Ambulatory Visit (INDEPENDENT_AMBULATORY_CARE_PROVIDER_SITE_OTHER): Payer: Medicare Other | Admitting: Family Medicine

## 2021-06-28 ENCOUNTER — Encounter: Payer: Self-pay | Admitting: Family Medicine

## 2021-06-28 VITALS — BP 128/82 | HR 51 | Temp 97.8°F | Wt 263.7 lb

## 2021-06-28 DIAGNOSIS — I10 Essential (primary) hypertension: Secondary | ICD-10-CM | POA: Diagnosis not present

## 2021-06-28 DIAGNOSIS — I700 Atherosclerosis of aorta: Secondary | ICD-10-CM

## 2021-06-28 DIAGNOSIS — I4819 Other persistent atrial fibrillation: Secondary | ICD-10-CM | POA: Diagnosis not present

## 2021-06-28 DIAGNOSIS — E785 Hyperlipidemia, unspecified: Secondary | ICD-10-CM

## 2021-06-28 DIAGNOSIS — I2510 Atherosclerotic heart disease of native coronary artery without angina pectoris: Secondary | ICD-10-CM | POA: Diagnosis not present

## 2021-06-28 MED ORDER — AMLODIPINE BESYLATE 5 MG PO TABS: 5.0000 mg | ORAL_TABLET | Freq: Every day | ORAL | 1 refills | Status: DC

## 2021-06-28 NOTE — Assessment & Plan Note (Signed)
Will keep BP and cholesterol under good control. Continue to monitor. Continue to follow with cardiology. 

## 2021-06-28 NOTE — Assessment & Plan Note (Signed)
Under good control on current regimen. Continue current regimen. Continue to monitor. Call with any concerns. Refills given. Labs drawn today.   

## 2021-06-28 NOTE — Assessment & Plan Note (Addendum)
Under good control on current regimen. Continue current regimen. Continue to monitor. Call with any concerns. Refills will be given based on levels and if we can drop to 40mg . Labs drawn today.

## 2021-06-28 NOTE — Assessment & Plan Note (Signed)
NSR today. Continue to monitor. Continue to follow with cardiology.

## 2021-06-28 NOTE — Progress Notes (Signed)
BP 128/82    Pulse (!) 51    Temp 97.8 F (36.6 C)    Wt 263 lb 11.2 oz (119.6 kg)    SpO2 98%    BMI 32.96 kg/m    Subjective:    Patient ID: Zachary Rogers, male    DOB: [birth year], 71 y.o.   MRN: [id number]  HPI: Zachary Rogers is a 71 y.o. male  Chief Complaint  Patient presents with   Hypertension   Hyperlipidemia   Atrial Fibrillation   HYPERTENSION / HYPERLIPIDEMIA Satisfied with current treatment? yes Duration of hypertension: chronic BP monitoring frequency: not checking BP medication side effects: no Past BP meds: amlodipine Duration of hyperlipidemia: chronic Cholesterol medication side effects: no Cholesterol supplements: fish oil Past cholesterol medications: atorvastatin Medication compliance: excellent compliance Aspirin: no Recent stressors: no Recurrent headaches: no Visual changes: no Palpitations: no Dyspnea: no Chest pain: no Lower extremity edema: no Dizzy/lightheaded: no  Relevant past medical, surgical, family and social history reviewed and updated as indicated. Interim medical history since our last visit reviewed. Allergies and medications reviewed and updated.  Review of Systems  Constitutional: Negative.   Respiratory: Negative.    Cardiovascular: Negative.   Musculoskeletal: Negative.   Psychiatric/Behavioral: Negative.     Per HPI unless specifically indicated above     Objective:    BP 128/82    Pulse (!) 51    Temp 97.8 F (36.6 C)    Wt 263 lb 11.2 oz (119.6 kg)    SpO2 98%    BMI 32.96 kg/m   Wt Readings from Last 3 Encounters:  06/28/21 263 lb 11.2 oz (119.6 kg)  05/31/21 263 lb (119.3 kg)  04/30/21 265 lb (120.2 kg)    Physical Exam Vitals and nursing note reviewed.  Constitutional:      General: He is not in acute distress.    Appearance: Normal appearance. He is not ill-appearing, toxic-appearing or diaphoretic.  HENT:     Head: Normocephalic and atraumatic.     Right Ear: External ear normal.     Left  Ear: External ear normal.     Nose: Nose normal.     Mouth/Throat:     Mouth: Mucous membranes are moist.     Pharynx: Oropharynx is clear.  Eyes:     General: No scleral icterus.       Right eye: No discharge.        Left eye: No discharge.     Extraocular Movements: Extraocular movements intact.     Conjunctiva/sclera: Conjunctivae normal.     Pupils: Pupils are equal, round, and reactive to light.  Cardiovascular:     Rate and Rhythm: Normal rate and regular rhythm.     Pulses: Normal pulses.     Heart sounds: Normal heart sounds. No murmur heard.   No friction rub. No gallop.  Pulmonary:     Effort: Pulmonary effort is normal. No respiratory distress.     Breath sounds: Normal breath sounds. No stridor. No wheezing, rhonchi or rales.  Chest:     Chest wall: No tenderness.  Musculoskeletal:        General: Normal range of motion.     Cervical back: Normal range of motion and neck supple.  Skin:    General: Skin is warm and dry.     Capillary Refill: Capillary refill takes less than 2 seconds.     Coloration: Skin is not jaundiced or pale.  Findings: No bruising, erythema, lesion or rash.  Neurological:     General: No focal deficit present.     Mental Status: He is alert and oriented to person, place, and time. Mental status is at baseline.  Psychiatric:        Mood and Affect: Mood normal.        Behavior: Behavior normal.        Thought Content: Thought content normal.        Judgment: Judgment normal.    Results for orders placed or performed during the hospital encounter of 04/29/21  CBC  Result Value Ref Range   WBC 8.1 4.0 - 10.5 K/uL   RBC 4.75 4.22 - 5.81 MIL/uL   Hemoglobin 14.2 13.0 - 17.0 g/dL   HCT 42.2 39.0 - 52.0 %   MCV 88.8 80.0 - 100.0 fL   MCH 29.9 26.0 - 34.0 pg   MCHC 33.6 30.0 - 36.0 g/dL   RDW 13.9 11.5 - 15.5 %   Platelets 168 150 - 400 K/uL   nRBC 0.0 0.0 - 0.2 %  Basic Metabolic Panel (BMET)  Result Value Ref Range   Sodium 139 135  - 145 mmol/L   Potassium 3.8 3.5 - 5.1 mmol/L   Chloride 109 98 - 111 mmol/L   CO2 24 22 - 32 mmol/L   Glucose, Bld 115 (H) 70 - 99 mg/dL   BUN 16 8 - 23 mg/dL   Creatinine, Ser 1.10 0.61 - 1.24 mg/dL   Calcium 9.3 8.9 - 10.3 mg/dL   GFR, Estimated >60 >60 mL/min   Anion gap 6 5 - 15      Assessment & Plan:   Problem List Items Addressed This Visit       Cardiovascular and Mediastinum   Essential hypertension - Primary    Under good control on current regimen. Continue current regimen. Continue to monitor. Call with any concerns. Refills given. Labs drawn today.       Relevant Medications   amLODipine (NORVASC) 5 MG tablet   Other Relevant Orders   Comprehensive metabolic panel   Aortic atherosclerosis (HCC)    Will keep BP and cholesterol under good control. Continue to monitor. Continue to follow with cardiology.      Relevant Medications   amLODipine (NORVASC) 5 MG tablet   Coronary artery disease involving native coronary artery of native heart without angina pectoris    Will keep BP and cholesterol under good control. Continue to monitor. Continue to follow with cardiology.      Relevant Medications   amLODipine (NORVASC) 5 MG tablet   Persistent atrial fibrillation (HCC)    NSR today. Continue to monitor. Continue to follow with cardiology.      Relevant Medications   amLODipine (NORVASC) 5 MG tablet     Other   Hyperlipidemia LDL goal <70    Under good control on current regimen. Continue current regimen. Continue to monitor. Call with any concerns. Refills will be given based on levels and if we can drop to 40mg . Labs drawn today.       Relevant Medications   amLODipine (NORVASC) 5 MG tablet   Other Relevant Orders   Comprehensive metabolic panel   Lipid Panel w/o Chol/HDL Ratio     Follow up plan: Return in about 6 months (around 12/26/2021).

## 2021-06-29 LAB — COMPREHENSIVE METABOLIC PANEL
ALT: 49 IU/L — ABNORMAL HIGH (ref 0–44)
AST: 29 IU/L (ref 0–40)
Albumin/Globulin Ratio: 2.2 (ref 1.2–2.2)
Albumin: 4.7 g/dL (ref 3.7–4.7)
Alkaline Phosphatase: 95 IU/L (ref 44–121)
BUN/Creatinine Ratio: 18 (ref 10–24)
BUN: 21 mg/dL (ref 8–27)
Bilirubin Total: 1.8 mg/dL — ABNORMAL HIGH (ref 0.0–1.2)
CO2: 24 mmol/L (ref 20–29)
Calcium: 9.2 mg/dL (ref 8.6–10.2)
Chloride: 105 mmol/L (ref 96–106)
Creatinine, Ser: 1.2 mg/dL (ref 0.76–1.27)
Globulin, Total: 2.1 g/dL (ref 1.5–4.5)
Glucose: 98 mg/dL (ref 70–99)
Potassium: 4.1 mmol/L (ref 3.5–5.2)
Sodium: 141 mmol/L (ref 134–144)
Total Protein: 6.8 g/dL (ref 6.0–8.5)
eGFR: 64 mL/min/{1.73_m2} (ref >59)

## 2021-06-29 LAB — LIPID PANEL W/O CHOL/HDL RATIO
Cholesterol, Total: 138 mg/dL (ref 100–199)
HDL: 72 mg/dL (ref >39)
LDL Chol Calc (NIH): 51 mg/dL (ref 0–99)
Triglycerides: 77 mg/dL (ref 0–149)
VLDL Cholesterol Cal: 15 mg/dL (ref 5–40)

## 2021-07-25 ENCOUNTER — Ambulatory Visit (INDEPENDENT_AMBULATORY_CARE_PROVIDER_SITE_OTHER): Payer: Medicare Other | Admitting: *Deleted

## 2021-07-25 DIAGNOSIS — Z0000 Encounter for general adult medical examination without abnormal findings: Secondary | ICD-10-CM

## 2021-07-25 NOTE — Patient Instructions (Signed)
Zachary Rogers , Thank you for taking time to come for your Medicare Wellness Visit. I appreciate your ongoing commitment to your health goals. Please review the following plan we discussed and let me know if I can assist you in the future.   Screening recommendations/referrals: Colonoscopy: up to date Recommended yearly ophthalmology/optometry visit for glaucoma screening and checkup Recommended yearly dental visit for hygiene and checkup  Vaccinations: Influenza vaccine: up to date Pneumococcal vaccine: up to date Tdap vaccine: up to date Shingles vaccine: Education provided    Advanced directives: not on file  Conditions/risks identified:   Next appointment: 12-26-2021 @ 8:20 [facility] 65 Years and Older, Male Preventive care refers to lifestyle choices and visits with your health care provider that can promote health and wellness. What does preventive care include? A yearly physical exam. This is also called an annual well check. Dental exams once or twice a year. Routine eye exams. Ask your health care provider how often you should have your eyes checked. Personal lifestyle choices, including: Daily care of your teeth and gums. Regular physical activity. Eating a healthy diet. Avoiding tobacco and drug use. Limiting alcohol use. Practicing safe sex. Taking low doses of aspirin every day. Taking vitamin and mineral supplements as recommended by your health care provider. What happens during an annual well check? The services and screenings done by your health care provider during your annual well check will depend on your age, overall health, lifestyle risk factors, and family history of disease. Counseling  Your health care provider may ask you questions about your: Alcohol use. Tobacco use. Drug use. Emotional well-being. Home and relationship well-being. Sexual activity. Eating habits. History of falls. Memory and ability to understand  (cognition). Work and work Statistician. Screening  You may have the following tests or measurements: Height, weight, and BMI. Blood pressure. Lipid and cholesterol levels. These may be checked every 5 years, or more frequently if you are over 31 years old. Skin check. Lung cancer screening. You may have this screening every year starting at age 25 if you have a 30-pack-year history of smoking and currently smoke or have quit within the past 15 years. Fecal occult blood test (FOBT) of the stool. You may have this test every year starting at age 67. Flexible sigmoidoscopy or colonoscopy. You may have a sigmoidoscopy every 5 years or a colonoscopy every 10 years starting at age 5. Prostate cancer screening. Recommendations will vary depending on your family history and other risks. Hepatitis C blood test. Hepatitis B blood test. Sexually transmitted disease (STD) testing. Diabetes screening. This is done by checking your blood sugar (glucose) after you have not eaten for a while (fasting). You may have this done every 1-3 years. Abdominal aortic aneurysm (AAA) screening. You may need this if you are a current or former smoker. Osteoporosis. You may be screened starting at age 75 if you are at high risk. Talk with your health care provider about your test results, treatment options, and if necessary, the need for more tests. Vaccines  Your health care provider may recommend certain vaccines, such as: Influenza vaccine. This is recommended every year. Tetanus, diphtheria, and acellular pertussis (Tdap, Td) vaccine. You may need a Td booster every 10 years. Zoster vaccine. You may need this after age 46. Pneumococcal 13-valent conjugate (PCV13) vaccine. One dose is recommended after age 47. Pneumococcal polysaccharide (PPSV23) vaccine. One dose is recommended after age 80. Talk to your health care provider about which screenings  and vaccines you need and how often you need them. This  information is not intended to replace advice given to you by your health care provider. Make sure you discuss any questions you have with your health care provider. Document Released: 06/01/2015 Document Revised: 01/23/2016 Document Reviewed: 03/06/2015 Elsevier Interactive Patient Education  2017 [place] Prevention in the Home Falls can cause injuries. They can happen to people of all ages. There are many things you can do to make your home safe and to help prevent falls. What can I do on the outside of my home? Regularly fix the edges of walkways and driveways and fix any cracks. Remove anything that might make you trip as you walk through a door, such as a raised step or threshold. Trim any bushes or trees on the path to your home. Use bright outdoor lighting. Clear any walking paths of anything that might make someone trip, such as rocks or tools. Regularly check to see if handrails are loose or broken. Make sure that both sides of any steps have handrails. Any raised decks and porches should have guardrails on the edges. Have any leaves, snow, or ice cleared regularly. Use sand or salt on walking paths during winter. Clean up any spills in your garage right away. This includes oil or grease spills. What can I do in the bathroom? Use night lights. Install grab bars by the toilet and in the tub and shower. Do not use towel bars as grab bars. Use non-skid mats or decals in the tub or shower. If you need to sit down in the shower, use a plastic, non-slip stool. Keep the floor dry. Clean up any water that spills on the floor as soon as it happens. Remove soap buildup in the tub or shower regularly. Attach bath mats securely with double-sided non-slip rug tape. Do not have throw rugs and other things on the floor that can make you trip. What can I do in the bedroom? Use night lights. Make sure that you have a light by your bed that is easy to reach. Do not use any sheets or  blankets that are too big for your bed. They should not hang down onto the floor. Have a firm chair that has side arms. You can use this for support while you get dressed. Do not have throw rugs and other things on the floor that can make you trip. What can I do in the kitchen? Clean up any spills right away. Avoid walking on wet floors. Keep items that you use a lot in easy-to-reach places. If you need to reach something above you, use a strong step stool that has a grab bar. Keep electrical cords out of the way. Do not use floor polish or wax that makes floors slippery. If you must use wax, use non-skid floor wax. Do not have throw rugs and other things on the floor that can make you trip. What can I do with my stairs? Do not leave any items on the stairs. Make sure that there are handrails on both sides of the stairs and use them. Fix handrails that are broken or loose. Make sure that handrails are as long as the stairways. Check any carpeting to make sure that it is firmly attached to the stairs. Fix any carpet that is loose or worn. Avoid having throw rugs at the top or bottom of the stairs. If you do have throw rugs, attach them to the floor with carpet tape.  Make sure that you have a light switch at the top of the stairs and the bottom of the stairs. If you do not have them, ask someone to add them for you. What else can I do to help prevent falls? Wear shoes that: Do not have high heels. Have rubber bottoms. Are comfortable and fit you well. Are closed at the toe. Do not wear sandals. If you use a stepladder: Make sure that it is fully opened. Do not climb a closed stepladder. Make sure that both sides of the stepladder are locked into place. Ask someone to hold it for you, if possible. Clearly mark and make sure that you can see: Any grab bars or handrails. First and last steps. Where the edge of each step is. Use tools that help you move around (mobility aids) if they are  needed. These include: Canes. Walkers. Scooters. Crutches. Turn on the lights when you go into a dark area. Replace any light bulbs as soon as they burn out. Set up your furniture so you have a clear path. Avoid moving your furniture around. If any of your floors are uneven, fix them. If there are any pets around you, be aware of where they are. Review your medicines with your doctor. Some medicines can make you feel dizzy. This can increase your chance of falling. Ask your doctor what other things that you can do to help prevent falls. This information is not intended to replace advice given to you by your health care provider. Make sure you discuss any questions you have with your health care provider. Document Released: 03/01/2009 Document Revised: 10/11/2015 Document Reviewed: 06/09/2014 Elsevier Interactive Patient Education  2017 [employer].

## 2021-07-25 NOTE — Progress Notes (Signed)
Subjective:   Zachary Rogers is a 71 y.o. male who presents for Medicare Annual/Subsequent preventive examination.  I connected with  Zachary Rogers on 07/25/21 by a telephone enabled telemedicine application and verified that I am speaking with the correct person using two identifiers.   I discussed the limitations of evaluation and management by telemedicine. The patient expressed understanding and agreed to proceed.  Patient location: home  Provider location:   Tele-Health not in office   Review of Systems     Cardiac Risk Factors include: advanced age (>78mn, >>76women);male gender;hypertension     Objective:    Today's Vitals   There is no height or weight on file to calculate BMI.  Advanced Directives 02/13/2021 12/17/2020 07/23/2020 07/18/2019 11/09/2018 07/12/2018  Does Patient Have a Medical Advance Directive? No Yes Yes Yes Yes Yes  Type of Advance Directive - Healthcare [place];Healthcare Power of [place];Out of facility DNR (pink MOST or yellow form) Living will;Healthcare Power of Attorney  Copy of [place] Chart? - No - copy requested No - copy requested No - copy requested No - copy requested No - copy requested  Would patient like information on creating a medical advance directive? Yes (MAU/Ambulatory/Procedural Areas - Information given) - - - - -    Current Medications (verified) Outpatient Encounter Medications as of 07/25/2021  Medication Sig   amLODipine (NORVASC) 5 MG tablet Take 1 tablet (5 mg total) by mouth daily.   apixaban (ELIQUIS) 5 MG TABS tablet TAKE 1 TABLET(5 MG) BY MOUTH TWICE DAILY   atorvastatin (LIPITOR) 80 MG tablet Take 1 tablet (80 mg total) by mouth daily.   Multiple Vitamin (MULTIVITAMIN) capsule Take 1 capsule by mouth daily.   Omega-3 Fatty Acids (FISH OIL) 1000 MG CAPS Take 1,000 mg by mouth daily.   No  facility-administered encounter medications on file as of 07/25/2021.    Allergies (verified) Patient has no known allergies.   History: Past Medical History:  Diagnosis Date   Actinic keratosis    R lat neck - bx proven with verruca vulgaris   [place]   Dysplastic nevus 06/22/2017   L mid back parapinal -severe, excision 10/06/2017   Hypertension    Lateral epicondylitis (tennis elbow) 09/04/2020   Mixed hyperlipidemia 07/18/2019   Nephrolithiasis    Past Surgical History:  Procedure Laterality Date   BUBBLE STUDY  12/17/2020   Procedure: BUBBLE STUDY;  Surgeon: RFay Records MD;  [place]  Service: Cardiovascular;;   CARDIOVERSION N/A 04/30/2021   Procedure: CARDIOVERSION;  Surgeon: ENelva Bush MD;  Location: [facility];  Service: Cardiovascular;  Laterality: N/A;   CHOLECYSTECTOMY  06/07/1998   COLONOSCOPY     COLONOSCOPY WITH PROPOFOL N/A 11/09/2018   Procedure: COLONOSCOPY WITH PROPOFOL;  Surgeon: AJonathon Bellows MD;  [facility];  Service: Gastroenterology;  Laterality: N/A;   PATENT FORAMEN OVALE(PFO) CLOSURE N/A 02/13/2021   Procedure: PATENT FORAMEN OVALE (PFO) CLOSURE;  Surgeon: CSherren Mocha MD;  [facility];  Service: Cardiovascular;  Laterality: N/A;   SKIN LESION EXCISION     dermatlogy - precancerous lesion 1 year ago on back    TEE WITHOUT CARDIOVERSION N/A 12/17/2020   Procedure: TRANSESOPHAGEAL ECHOCARDIOGRAM (TEE);  Surgeon: RFay Records MD;  [facility];  Service: Cardiovascular;  Laterality: N/A;   Family History  Problem  Relation Age of Onset   Lung cancer Mother    Hypertension Father    Diabetes Father    Transient ischemic attack Father    Heart attack Maternal Grandfather    Prostate cancer Neg Hx    Bladder Cancer Neg Hx    Kidney cancer Neg Hx    Social History   Socioeconomic History   Marital status: Married    Spouse name: Not on file    Number of children: Not on file   Years of education: Not on file   Highest education level: Master's degree (e.g., MA, MS, MEng, MEd, MSW, MBA)  Occupational History   Occupation: working fulltime   Tobacco Use   Smoking status: Former    Packs/day: 0.00    Years: 0.00    Pack years: 0.00    Types: Cigarettes    Quit date: 2000    Years since quitting: 23.2   Smokeless tobacco: Never   Tobacco comments:    social smoker when younger   Media planner   Vaping Use: Never used  Substance and Sexual Activity   Alcohol use: Yes    Alcohol/week: 5.0 standard drinks    Types: 5 Glasses of wine per week   Drug use: No   Sexual activity: Yes    Birth control/protection: Surgical  Other Topics Concern   Not on file  Social History Theatre stage manager, local friends.    Social Determinants of Health   Financial Resource Strain: Low Risk    Difficulty of Paying Living Expenses: Not hard at all  Food Insecurity: No Food Insecurity   Worried About Charity fundraiser in the Last Year: Never true   [place] in the Last Year: Never true  Transportation Needs: No Transportation Needs   Lack of Transportation (Medical): No   Lack of Transportation (Non-Medical): No  Physical Activity: Sufficiently Active   Days of Exercise per Week: 5 days   Minutes of Exercise per Session: 60 min  Stress: No Stress Concern Present   Feeling of Stress : Not at all  Social Connections: Moderately Integrated   Frequency of Communication with Friends and Family: More than three times a week   Frequency of Social Gatherings with Friends and Family: Three times a week   Attends Religious Services: Never   Active Member of Clubs or Organizations: Yes   Attends Music therapist: More than 4 times per year   Marital Status: Married    Tobacco Counseling Counseling given: Not Answered Tobacco comments: social smoker when younger    Clinical Intake:  Pre-visit preparation  completed: Yes  Pain : No/denies pain     Nutritional Risks: None Diabetes: No  How often do you need to have someone help you when you read instructions, pamphlets, or other written materials from your doctor or pharmacy?: 1 - Never  Diabetic?    no  Interpreter Needed?: No  Information entered by :: Leroy Kennedy LPN   Activities of Daily Living In your present state of health, do you have any difficulty performing the following activities: 07/25/2021 04/30/2021  Hearing? N N  Vision? N N  Difficulty concentrating or making decisions? N N  Walking or climbing stairs? N N  Dressing or bathing? N N  Doing errands, shopping? N -  Preparing Food and eating ? N -  Using the Toilet? N -  In the past six months, have you accidently leaked urine? N -  Do you have problems with loss of bowel control? N -  Managing your Medications? N -  Managing your Finances? N -  Housekeeping or managing your Housekeeping? N -  Some recent data might be hidden    Patient Care Team: Valerie Roys, DO as PCP - General (Family Medicine) End, Harrell Gave, MD as PCP - Cardiology (Cardiology) Laneta Simmers as Physician Assistant (Urology) Ralene Bathe, MD (Dermatology)  Indicate any recent Medical Services you may have received from other than [facility] providers in the past year (date may be approximate).     Assessment:   This is a routine wellness examination for [place].  Hearing/Vision screen Hearing Screening - Comments:: No trouble hearing Vision Screening - Comments:: Up to date [facility]  Dietary issues and exercise activities discussed: Current Exercise Habits: Home exercise routine, Type of exercise: walking, Time (Minutes): 60, Frequency (Times/Week): 5, Weekly Exercise (Minutes/Week): 300, Intensity: Moderate   Goals Addressed               This Visit's Progress     Exercise 3x per week (30 min per time) (pt-stated)   Not on track     Walk every day for approx  75mn.       Patient Stated        Continue walking and current lifestyle       Depression Screen PHQ 2/9 Scores 07/25/2021 06/28/2021 07/23/2020 07/18/2019 07/12/2018 06/19/2017 06/02/2017  PHQ - 2 Score 0 0 0 0 0 0 0  PHQ- 9 Score 0 0 - - - - -    Fall Risk Fall Risk  07/25/2021 06/28/2021 07/23/2020 07/18/2019 12/16/2018  Falls in the past year? 0 0 0 0 0  Comment - - - - Emmi Telephone Survey: data to providers prior to load  Number falls in past yr: 0 0 - 0 -  Comment - - - - -  Injury with Fall? 0 0 - 0 -  Risk for fall due to : - - Medication side effect - -  Follow up Falls evaluation completed;Falls prevention discussed Falls evaluation completed Falls evaluation completed;Education provided;Falls prevention discussed - -    FALL RISK PREVENTION PERTAINING TO THE HOME:  Any stairs in or around the home? Yes  If so, are there any without handrails? No  Home free of loose throw rugs in walkways, pet beds, electrical cords, etc? Yes  Adequate lighting in your home to reduce risk of falls? Yes   ASSISTIVE DEVICES UTILIZED TO PREVENT FALLS:  Life alert? No  Use of a cane, walker or w/c? No  Grab bars in the bathroom? No  Shower chair or bench in shower? Yes  Elevated toilet seat or a handicapped toilet? Yes   TIMED UP AND GO:  Was the test performed? No .     Cognitive Function:  Normal cognitive status assessed by direct observation by this Nurse Health Advisor. No abnormalities found.       6CIT Screen 07/23/2020 07/18/2019 07/12/2018 06/19/2017  What Year? 0 points 0 points 0 points 0 points  What month? 0 points 0 points 0 points 0 points  What time? 0 points 0 points 0 points 0 points  Count back from 20 0 points 0 points 0 points 0 points  Months in reverse 0 points 0 points 0 points 0 points  Repeat phrase 2 points 4 points 0 points 4 points  Total Score 2 4 0 4    Immunizations Immunization History  Administered Date(s) Administered   Fluad Quad(high Dose 65+)  02/13/2020   Influenza, High Dose Seasonal PF 05/06/2017, 04/07/2018, 03/15/2019   Influenza,inj,Quad PF,6+ Mos 06/19/2014, 03/27/2015   Influenza-Unspecified 05/06/2017, 03/15/2019, 03/25/2021   PFIZER(Purple Top)SARS-COV-2 Vaccination 06/11/2019, 07/07/2019, 02/27/2020, 09/28/2020   Pneumococcal Conjugate-13 06/19/2014   Pneumococcal Polysaccharide-23 06/20/2015, 07/23/2015   Tdap 06/19/2014   Zoster, Live 06/19/2014    TDAP status: Up to date  Flu Vaccine status: Up to date  Pneumococcal vaccine status: Up to date  Covid-19 vaccine status: Completed vaccines  Qualifies for Shingles Vaccine? Yes   Zostavax completed Yes   Shingrix Completed?: No.    Education has been provided regarding the importance of this vaccine. Patient has been advised to call insurance company to determine out of pocket expense if they have not yet received this vaccine. Advised may also receive vaccine at local pharmacy or Health Dept. Verbalized acceptance and understanding.  Screening Tests Health Maintenance  Topic Date Due   COVID-19 Vaccine (5 - Booster for Pfizer series) 11/23/2020   Zoster Vaccines- Shingrix (1 of 2) 10/25/2021 (Originally 08/08/1969)   COLONOSCOPY (Pts 45-61yr Insurance coverage will need to be confirmed)  11/09/2023   TETANUS/TDAP  06/19/2024   Pneumonia Vaccine 71 Years old  Completed   INFLUENZA VACCINE  Completed   Hepatitis C Screening  Completed   HPV VACCINES  Aged Out    Health Maintenance  Health Maintenance Due  Topic Date Due   COVID-19 Vaccine (5 - Booster for PAlpineseries) 11/23/2020    Colorectal cancer screening: Type of screening: Colonoscopy. Completed 2020. Repeat every 5 years  Lung Cancer Screening: (Low Dose CT Chest recommended if Age 73-80years, 30 pack-year currently smoking OR have quit w/in 15years.) does not qualify.   Lung Cancer Screening Referral:   Additional Screening:  Hepatitis C Screening: does not qualify; Completed  2019  Vision Screening: Recommended annual ophthalmology exams for early detection of glaucoma and other disorders of the eye. Is the patient up to date with their annual eye exam?  Yes  Who is the provider or what is the name of the office in which the patient attends annual eye exams? [facility] If pt is not established with a provider, would they like to be referred to a provider to establish care? No .   Dental Screening: Recommended annual dental exams for proper oral hygiene  Community Resource Referral / Chronic Care Management: CRR required this visit?  No   CCM required this visit?  No      Plan:     I have personally reviewed and noted the following in the patients chart:   Medical and social history Use of alcohol, tobacco or illicit drugs  Current medications and supplements including opioid prescriptions. Patient is not currently taking opioid prescriptions. Functional ability and status Nutritional status Physical activity Advanced directives List of other physicians Hospitalizations, surgeries, and ER visits in previous 12 months Vitals Screenings to include cognitive, depression, and falls Referrals and appointments  In addition, I have reviewed and discussed with patient certain preventive protocols, quality metrics, and best practice recommendations. A written personalized care plan for preventive services as well as general preventive health recommendations were provided to patient.     JLeroy Kennedy LPN   [id number]/09/[id number]  Nurse Notes:

## 2021-07-26 ENCOUNTER — Ambulatory Visit: Payer: Medicare Other

## 2021-09-05 ENCOUNTER — Ambulatory Visit: Admitting: Urology

## 2021-09-18 NOTE — Progress Notes (Signed)
09/19/21 ?9:07 AM  ? ?Michoel Kunin ?1951-05-04 ?[id number] ? ?Referring provider:  ?Valerie Roys, DO ?[facility] ?[place],  [place] [address] ?Chief Complaint  ?Patient presents with  ? Benign Prostatic Hypertrophy  ? ? ?Urological history:  ?1. Nephrolithiasis  ?-spontaneous passage of a 4 mm right UVJ stone in 2019  ?-KUB 09/2021 bilateral lower pole nephrolithiasis with largest ~ 4 mm in size  ?   ?2. BPH  ?-PSA pending  ? ?HPI: ?Zachary Rogers is a 71 y.o.male who presents today for a 1 year follow-up with KUB, UA, and office visit.  ? ?He is not having any urinary complaints at this visit.  Patient denies any modifying or aggravating factors.  Patient denies any gross hematuria, dysuria or suprapubic/flank pain.  Patient denies any fevers, chills, nausea or vomiting. ? 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 ? ?UA negative for micro heme ? ?KUB bilateral nephrolithiasis ? ?PMH: ?Past Medical History:  ?Diagnosis Date  ? Actinic keratosis   ? R lat neck - bx proven with verruca vulgaris  ? Cerebrovascular accident (CVA) ([place]) 10/25/2020  ? Dysplastic nevus 06/22/2017  ? L mid back parapinal -severe, excision 10/06/2017  ? Hypertension   ? Lateral epicondylitis (tennis elbow) 09/04/2020  ? Mixed hyperlipidemia 07/18/2019  ? Nephrolithiasis   ? ? ?Surgical History: ?Past Surgical History:  ?Procedure Laterality Date  ? BUBBLE STUDY  12/17/2020  ? Procedure: BUBBLE STUDY;  Surgeon: Fay Records, MD;  Location: [place];  Service: Cardiovascular;;  ? CARDIOVERSION N/A 04/30/2021  ? Procedure: CARDIOVERSION;  Surgeon: Nelva Bush, MD;  Location: [facility];  Service: Cardiovascular;  Laterality: N/A;  ? CHOLECYSTECTOMY  06/07/1998  ? COLONOSCOPY    ? COLONOSCOPY WITH PROPOFOL N/A 11/09/2018  ? Procedure: COLONOSCOPY WITH PROPOFOL;  Surgeon: [name], MD;  Location: [facility] ENDOSCOPY;  Service: Gastroenterology;  Laterality: N/A;  ? PATENT FORAMEN OVALE(PFO)  CLOSURE N/A 02/13/2021  ? Procedure: PATENT FORAMEN OVALE (PFO) CLOSURE;  Surgeon: Sherren Mocha, MD;  Location: [place] CV LAB;  Service: Cardiovascular;  Laterality: N/A;  ? SKIN LESION EXCISION    ? dermatlogy - precancerous lesion 1 year ago on back   ? TEE WITHOUT CARDIOVERSION N/A 12/17/2020  ? Procedure: TRANSESOPHAGEAL ECHOCARDIOGRAM (TEE);  Surgeon: Fay Records, MD;  Location: [place];  Service: Cardiovascular;  Laterality: N/A;  ? ? ?Home Medications:  ?Allergies as of 09/19/2021   ?No Known Allergies ?  ? ?  ?Medication List  ?  ? ?  ? Accurate as of Sep 19, 2021  9:07 AM. If you have any questions, ask your nurse or doctor.  ?  ?  ? ?  ? ?amLODipine 5 MG tablet ?Commonly known as: NORVASC ?Take 1 tablet (5 mg total) by mouth daily. ?  ?atorvastatin 80 MG tablet ?Commonly known as: LIPITOR ?Take 1 tablet (80 mg total) by mouth daily. ?  ?Eliquis 5 MG Tabs tablet ?Generic drug: apixaban ?TAKE 1 TABLET(5 MG) BY MOUTH TWICE DAILY ?  ?Fish Oil 1000 MG Caps ?Take 1,000 mg by mouth daily. ?  ?multivitamin capsule ?Take 1 capsule by mouth daily. ?  ? ?  ? ? ?Allergies:  ?No Known Allergies ? ?Family History: ?Family History  ?Problem Relation Age of Onset  ? Lung cancer Mother   ? Hypertension Father   ? Diabetes Father   ? Transient ischemic attack Father   ?  Heart attack Maternal Grandfather   ? Prostate cancer Neg Hx   ? Bladder Cancer Neg Hx   ? Kidney cancer Neg Hx   ? ? ?Social History:  reports that he quit smoking about 23 years ago. His smoking use included cigarettes. He has never used smokeless tobacco. He reports current alcohol use of about 5.0 standard drinks per week. He reports that he does not use drugs. ? ? ?Physical Exam: ?BP (!) 144/81   Pulse (!) 58   Ht $R'6\' 3"'hf$  (1.905 m)   Wt 264 lb (119.7 kg)   BMI 33.00 kg/m?   ?Constitutional:  Well nourished. Alert and oriented, No acute distress. ?HEENT: [place] AT, moist mucus membranes.  Trachea midline ?Cardiovascular: No clubbing, cyanosis, or  edema. ?Respiratory: Normal respiratory effort, no increased work of breathing. ?GU: No CVA tenderness.  No bladder fullness or masses.  Patient with uncircumcised phallus.  Foreskin easily retracted  Urethral meatus is patent.  No penile discharge. No penile lesions or rashes. Scrotum without lesions, cysts, rashes and/or edema.  Testicles are located scrotally bilaterally. No masses are appreciated in the testicles. Left and right epididymis are normal.  A left spermatocele is noted.  ?Rectal: Patient with  normal sphincter tone. Anus and perineum without scarring or rashes. No rectal masses are appreciated. Prostate is approximately 40 +  grams, could only palpate the apex and the midportion of the gland, no nodules are appreciated. Seminal vesicles are normal. ?Neurologic: Grossly intact, no focal deficits, moving all 4 extremities. ?Psychiatric: Normal mood and affect.  ?Laboratory Data: ? ?Lab Results  ?Component Value Date  ? CREATININE 1.20 06/28/2021  ? ?Component ?    Latest Ref Rng 03/20/2021  ?Glucose ?    70 - 99 mg/dL 110 (H)   ?BUN ?    8 - 27 mg/dL 14   ?Creatinine ?    0.76 - 1.27 mg/dL 1.33 (H)   ?Sodium ?    134 - 144 mmol/L 142   ?Potassium ?    3.5 - 5.2 mmol/L 3.9   ?Chloride ?    96 - 106 mmol/L 107 (H)   ?CO2 ?    20 - 29 mmol/L 23   ?Calcium ?    8.6 - 10.2 mg/dL 9.2   ?Anion gap ?    5 - 15    ?GFR, Estimated ?    >60 mL/min   ?  ?(H) High ?Component ?    Latest Ref Rng 06/28/2021  ?Glucose ?    70 - 99 mg/dL 98   ?BUN ?    8 - 27 mg/dL 21   ?Creatinine ?    0.76 - 1.27 mg/dL 1.20   ?BUN/Creatinine Ratio ?    10 - 24  18   ?Sodium ?    134 - 144 mmol/L 141   ?Potassium ?    3.5 - 5.2 mmol/L 4.1   ?Chloride ?    96 - 106 mmol/L 105   ?CO2 ?    20 - 29 mmol/L 24   ?Calcium ?    8.6 - 10.2 mg/dL 9.2   ?Total Protein ?    6.0 - 8.5 g/dL 6.8   ?Albumin ?    3.7 - 4.7 g/dL 4.7   ?Globulin, Total ?    1.5 - 4.5 g/dL 2.1   ?Albumin/Globulin Ratio ?    1.2 - 2.2  2.2   ?Total Bilirubin ?    0.0 - 1.2  mg/dL 1.8 (  H)   ?Alkaline Phosphatase ?    44 - 121 IU/L 95   ?AST ?    0 - 40 IU/L 29   ?ALT ?    0 - 44 IU/L 49 (H)   ?eGFR ?    >59 mL/min/1.73 64   ?  ?(H) High ? ?Component ?    Latest Ref Rng 06/28/2021  ?Cholesterol, Total ?    100 - 199 mg/dL 138   ?Triglycerides ?    0 - 149 mg/dL 77   ?HDL Cholesterol ?    >39 mg/dL 72   ?VLDL Cholesterol Cal ?    5 - 40 mg/dL 15   ?LDL Chol Calc (NIH) ?    0 - 99 mg/dL 51   ? ?Urinalysis ?Component ?    Latest Ref Rng 09/19/2021  ?Specific Gravity, UA ?    1.005 - 1.030  1.020   ?pH, UA ?    5.0 - 7.5  6.0   ?Color, UA ?    Yellow  Yellow   ?Appearance Ur ?    Clear  Clear   ?Leukocytes,UA ?    Negative  Negative   ?Protein,UA ?    Negative/Trace  Negative   ?Glucose, UA ?    Negative  Negative   ?Ketones, UA ?    Negative  Negative   ?RBC, UA ?    Negative  Negative   ?Bilirubin, UA ?    Negative  Negative   ?Urobilinogen, Ur ?    0.2 - 1.0 mg/dL 0.2   ?Nitrite, UA ?    Negative  Negative   ?Microscopic Examination See below:   ? ?Component ?    Latest Ref Rng 09/19/2021  ?RBC ?    0 - 2 /hpf None seen   ?WBC, UA ?    0 - 5 /hpf 0-5   ?Bacteria, UA ?    None seen/Few  None seen   ?Epithelial Cells (non renal) ?    0 - 10 /hpf 0-10   ?Crystals ?    N/A  Present !   ?Crystal Type ?    N/A  Calcium Oxalate   ?  ?! Abnormal ? ?I have reviewed the labs.  ? ?Pertinent Imaging: ?CLINICAL DATA:  Flank pain.  Prior nephrolithiasis. ?  ?EXAM: ?ABDOMEN - 1 VIEW ?  ?COMPARISON:  Abdominal radiograph 09/05/2020 ?  ?FINDINGS: ?Motion on the imaging limits evaluation for renal stones however ?there is suggestion of similar-appearing 5 mm stone within the ?inferior right and inferior left kidney. Pelvic surgical clips. ?Pelvic phleboliths. Lung bases are clear. Lumbar spine degenerative ?changes. ?  ?IMPRESSION: ?Motion artifact limits evaluation. Suggestion of small stones within ?the inferior right and inferior left kidneys. ?  ?  ?Electronically Signed ?  By: Lovey Newcomer M.D. ?  On:  09/19/2021 15:57 ?I have independently reviewed the films.  See HPI.   ? ?Assessment & Plan:   ? ?1. Bilateral stones ?-Small stones in the lower poles of both kidneys which are stable ?-Continue to monitor w

## 2021-09-19 ENCOUNTER — Ambulatory Visit
Admission: RE | Admit: 2021-09-19 | Discharge: 2021-09-19 | Disposition: A | Discharge status: Home / Self Care | Payer: Medicare Other | Attending: Urology | Admitting: Urology

## 2021-09-19 ENCOUNTER — Encounter: Payer: Self-pay | Admitting: Urology

## 2021-09-19 ENCOUNTER — Telehealth: Payer: Self-pay | Admitting: Urology

## 2021-09-19 ENCOUNTER — Ambulatory Visit
Admission: RE | Admit: 2021-09-19 | Discharge: 2021-09-19 | Disposition: A | Discharge status: Home / Self Care | Payer: Medicare Other | Source: Ambulatory Visit | Attending: Urology | Admitting: Urology

## 2021-09-19 ENCOUNTER — Telehealth: Payer: Self-pay

## 2021-09-19 ENCOUNTER — Ambulatory Visit (INDEPENDENT_AMBULATORY_CARE_PROVIDER_SITE_OTHER): Payer: Medicare Other | Admitting: Urology

## 2021-09-19 VITALS — BP 144/81 | HR 58 | Ht 75.0 in | Wt 264.0 lb

## 2021-09-19 DIAGNOSIS — I2510 Atherosclerotic heart disease of native coronary artery without angina pectoris: Secondary | ICD-10-CM

## 2021-09-19 DIAGNOSIS — N400 Enlarged prostate without lower urinary tract symptoms: Secondary | ICD-10-CM | POA: Diagnosis not present

## 2021-09-19 DIAGNOSIS — N200 Calculus of kidney: Secondary | ICD-10-CM | POA: Insufficient documentation

## 2021-09-19 DIAGNOSIS — R109 Unspecified abdominal pain: Secondary | ICD-10-CM | POA: Diagnosis not present

## 2021-09-19 LAB — URINALYSIS, COMPLETE
Bilirubin, UA: NEGATIVE
Glucose, UA: NEGATIVE
Ketones, UA: NEGATIVE
Leukocytes,UA: NEGATIVE
Nitrite, UA: NEGATIVE
Protein,UA: NEGATIVE
RBC, UA: NEGATIVE
Specific Gravity, UA: 1.02 (ref 1.005–1.030)
Urobilinogen, Ur: 0.2 mg/dL (ref 0.2–1.0)
pH, UA: 6 (ref 5.0–7.5)

## 2021-09-19 LAB — MICROSCOPIC EXAMINATION
Bacteria, UA: NONE SEEN
RBC, Urine: NONE SEEN /hpf (ref 0–2)

## 2021-09-19 NOTE — Telephone Encounter (Addendum)
error 

## 2021-09-19 NOTE — Telephone Encounter (Signed)
-----   Message from Nori Riis, PA-C sent at 09/19/2021  4:38 PM EDT ----- ?Please let [name] know that his Xray shows very small stones in the lower poles of both of his kidneys.  We will continue to keep an eye on them with yearly Xrays.  ?

## 2021-09-19 NOTE — Telephone Encounter (Signed)
Notified pt as advised, pt expressed understanding.  ?

## 2021-09-20 LAB — PSA: Prostate Specific Ag, Serum: 2 ng/mL (ref 0.0–4.0)

## 2021-09-24 ENCOUNTER — Other Ambulatory Visit: Payer: Self-pay | Admitting: Family Medicine

## 2021-09-25 NOTE — Telephone Encounter (Signed)
Requested Prescriptions  ?Pending Prescriptions Disp Refills  ?? atorvastatin (LIPITOR) 80 MG tablet [Pharmacy Med Name: ATORVASTATIN TABS '80MG'$ ] 90 tablet 1  ?  Sig: TAKE 1 TABLET DAILY  ?  ? Cardiovascular:  Antilipid - Statins Failed - 09/24/2021  3:32 AM  ?  ?  Failed - Lipid Panel in normal range within the last 12 months  ?  Cholesterol, Total  ?Date Value Ref Range Status  ?06/28/2021 138 100 - 199 mg/dL Final  ? ?LDL Chol Calc (NIH)  ?Date Value Ref Range Status  ?06/28/2021 51 0 - 99 mg/dL Final  ? ?HDL  ?Date Value Ref Range Status  ?06/28/2021 72 >39 mg/dL Final  ? ?Triglycerides  ?Date Value Ref Range Status  ?06/28/2021 77 0 - 149 mg/dL Final  ? ?  ?  ?  Passed - Patient is not pregnant  ?  ?  Passed - Valid encounter within last 12 months  ?  Recent Outpatient Visits   ?      ? 2 months ago Essential hypertension  ? [place], Megan P, DO  ? 6 months ago Essential hypertension  ? [place], Megan P, DO  ? 9 months ago PFO (patent foramen ovale)  ? [place] P, DO  ? 10 months ago Mixed hyperlipidemia  ? [place], Megan P, DO  ? 11 months ago History of stroke  ? [place], [place] P, DO  ?  ?  ?Future Appointments   ?        ? In 3 months Wynetta Emery, Barb Merino, DO [place], [facility]  ? In 5 months Ralene Bathe, MD [place]  ? In 12 months [place], Gordan Payment [place]  ?  ? ?  ?  ?  ? ?

## 2021-12-02 ENCOUNTER — Other Ambulatory Visit: Payer: Self-pay | Admitting: Family Medicine

## 2021-12-03 NOTE — Telephone Encounter (Signed)
Requested Prescriptions  Pending Prescriptions Disp Refills  . amLODipine (NORVASC) 5 MG tablet [Pharmacy Med Name: AMLODIPINE BESYLATE TABS '5MG'$ ] 90 tablet 3    Sig: TAKE 1 TABLET DAILY     Cardiovascular: Calcium Channel Blockers 2 Failed - 12/02/2021  3:39 AM      Failed - Last BP in normal range    BP Readings from Last 1 Encounters:  09/19/21 (!) 144/81         Passed - Last Heart Rate in normal range    Pulse Readings from Last 1 Encounters:  09/19/21 (!) 90         Passed - Valid encounter within last 6 months    Recent Outpatient Visits          5 months ago Essential hypertension   [place], [place], DO   8 months ago Essential hypertension   [place], [place], DO   11 months ago PFO (patent foramen ovale)   [employer], [place], DO   1 year ago Mixed hyperlipidemia   [facility] [place], [place], DO   1 year ago History of stroke   [employer], [place], DO      Future Appointments            In 1 week [place], [employer], PA-C [facility], [facility]   In 3 weeks [place], Barb Merino, DO [employer], [place]   In 3 months Nehemiah Massed, Monia Sabal, MD [place]   In 9 months [place], [place]

## 2021-12-12 ENCOUNTER — Encounter: Payer: Self-pay | Admitting: Medical

## 2021-12-12 ENCOUNTER — Other Ambulatory Visit
Admission: RE | Admit: 2021-12-12 | Discharge: 2021-12-12 | Disposition: A | Discharge status: Home / Self Care | Payer: Medicare Other | Attending: Medical | Admitting: Medical

## 2021-12-12 ENCOUNTER — Ambulatory Visit (INDEPENDENT_AMBULATORY_CARE_PROVIDER_SITE_OTHER): Payer: Medicare Other | Admitting: Medical

## 2021-12-12 VITALS — BP 122/68 | HR 113 | Ht 75.0 in | Wt 270.0 lb

## 2021-12-12 DIAGNOSIS — I4819 Other persistent atrial fibrillation: Secondary | ICD-10-CM | POA: Insufficient documentation

## 2021-12-12 DIAGNOSIS — I10 Essential (primary) hypertension: Secondary | ICD-10-CM

## 2021-12-12 DIAGNOSIS — Z8673 Personal history of transient ischemic attack (TIA), and cerebral infarction without residual deficits: Secondary | ICD-10-CM | POA: Diagnosis not present

## 2021-12-12 DIAGNOSIS — Q2112 Patent foramen ovale: Secondary | ICD-10-CM | POA: Diagnosis not present

## 2021-12-12 LAB — BASIC METABOLIC PANEL
Anion gap: 7 (ref 5–15)
BUN: 20 mg/dL (ref 8–23)
CO2: 21 mmol/L — ABNORMAL LOW (ref 22–32)
Calcium: 9.3 mg/dL (ref 8.9–10.3)
Chloride: 113 mmol/L — ABNORMAL HIGH (ref 98–111)
Creatinine, Ser: 1.38 mg/dL — ABNORMAL HIGH (ref 0.61–1.24)
GFR, Estimated: 54 mL/min — ABNORMAL LOW (ref >60)
Glucose, Bld: 106 mg/dL — ABNORMAL HIGH (ref 70–99)
Potassium: 3.6 mmol/L (ref 3.5–5.1)
Sodium: 141 mmol/L (ref 135–145)

## 2021-12-12 LAB — TSH: TSH: 1.442 u[IU]/mL (ref 0.350–4.500)

## 2021-12-12 LAB — CBC
HCT: 43.2 % (ref 39.0–52.0)
Hemoglobin: 15.1 g/dL (ref 13.0–17.0)
MCH: 30.2 pg (ref 26.0–34.0)
MCHC: 35 g/dL (ref 30.0–36.0)
MCV: 86.4 fL (ref 80.0–100.0)
Platelets: 179 10*3/uL (ref 150–400)
RBC: 5 MIL/uL (ref 4.22–5.81)
RDW: 13.9 % (ref 11.5–15.5)
WBC: 8.3 10*3/uL (ref 4.0–10.5)
nRBC: 0 % (ref 0.0–0.2)

## 2021-12-12 LAB — MAGNESIUM: Magnesium: 2.1 mg/dL (ref 1.7–2.4)

## 2021-12-12 MED ORDER — METOPROLOL TARTRATE 25 MG PO TABS: 25.0000 mg | ORAL_TABLET | Freq: Two times a day (BID) | ORAL | 6 refills | Status: DC

## 2021-12-12 NOTE — Patient Instructions (Signed)
Medication Instructions:  Your physician has recommended you make the following change in your medication:   START Lopressor (metoprolol tartrate) 25 mg twice a day.   *If you need a refill on your cardiac medications before your next appointment, please call your pharmacy*   Lab Work: BMET, MAG, CBC, TSH over at the following location.   [facility] Entrance at [facility] 1st desk on the right to check in (REGISTRATION)  Lab hours: Monday- Friday (7:30 am- 5:30 pm)  If you have labs (blood work) drawn today and your tests are completely normal, you will receive your results only by: [place] (if you have MyChart) OR A paper copy in the mail If you have any lab test that is abnormal or we need to change your treatment, we will call you to review the results.   Testing/Procedures: None   Follow-Up: At [facility], you and your health needs are our priority.  As part of our continuing mission to provide you with exceptional heart care, we have created designated Provider Care Teams.  These Care Teams include your primary Cardiologist (physician) and Advanced Practice Providers (APPs -  Physician Assistants and Nurse Practitioners) who all work together to provide you with the care you need, when you need it.   Your next appointment:   1 week(s)  The format for your next appointment:   In Person  Provider:   Cadence Kathlen Mody, PA-C      Important Information About Sugar

## 2021-12-12 NOTE — Progress Notes (Signed)
Cardiology Office Note:    Date:  12/12/2021   ID:  Zachary Rogers, DOB 1950/05/29, MRN [id number]  PCP:  Valerie Roys, DO  [facility] Cardiologist:  Nelva Bush, MD  [facility] Electrophysiologist:  None   Referring MD: Valerie Roys, DO   Chief Complaint: 6 month follow-up  History of Present Illness:    Zachary Rogers is a 71 y.o. male with a hx of stroke with subsequent disconvery of larte PFO s/p PFO closure, Afib s/p DCCV, HTN, HLD, nephrolithiasis.   Seen in November 2023 at which time he was still in Afib. He underwent cardioversion in December.   Last seen 05/31/21 and was in NSR. Suspected afib related to PFO closure. Eliquis was continued. Metoprolol was previously discontinued.  Today, the patient is back in Afib RVR with rate of 113bpm. He does not have significant symptoms. He saw his pulse was high the other day and suspected he was back in Afib. He recently did a lot of traveling and was drinking more alcohol than normal. He denies chest pain and SOB. No LLE, orthopnea or pnd. No known OSA history. He denies missing doses of Eliquis.   Past Medical History:  Diagnosis Date   Actinic keratosis    R lat neck - bx proven with verruca vulgaris   Cerebrovascular accident (CVA) ([place]) 10/25/2020   Dysplastic nevus 06/22/2017   L mid back parapinal -severe, excision 10/06/2017   Hypertension    Lateral epicondylitis (tennis elbow) 09/04/2020   Mixed hyperlipidemia 07/18/2019   Nephrolithiasis     Past Surgical History:  Procedure Laterality Date   BUBBLE STUDY  12/17/2020   Procedure: BUBBLE STUDY;  Surgeon: Fay Records, MD;  Location: [place];  Service: Cardiovascular;;   CARDIOVERSION N/A 04/30/2021   Procedure: CARDIOVERSION;  Surgeon: Nelva Bush, MD;  Location: [facility];  Service: Cardiovascular;  Laterality: N/A;   CHOLECYSTECTOMY  06/07/1998   COLONOSCOPY     COLONOSCOPY WITH PROPOFOL N/A 11/09/2018   Procedure: COLONOSCOPY  WITH PROPOFOL;  Surgeon: [name], MD;  Location: [facility] ENDOSCOPY;  Service: Gastroenterology;  Laterality: N/A;   PATENT FORAMEN OVALE(PFO) CLOSURE N/A 02/13/2021   Procedure: PATENT FORAMEN OVALE (PFO) CLOSURE;  Surgeon: Sherren Mocha, MD;  Location: [place] CV LAB;  Service: Cardiovascular;  Laterality: N/A;   SKIN LESION EXCISION     dermatlogy - precancerous lesion 1 year ago on back    TEE WITHOUT CARDIOVERSION N/A 12/17/2020   Procedure: TRANSESOPHAGEAL ECHOCARDIOGRAM (TEE);  Surgeon: Fay Records, MD;  Location: [facility] ENDOSCOPY;  Service: Cardiovascular;  Laterality: N/A;    Current Medications: Current Meds  Medication Sig   amLODipine (NORVASC) 5 MG tablet TAKE 1 TABLET DAILY   apixaban (ELIQUIS) 5 MG TABS tablet TAKE 1 TABLET(5 MG) BY MOUTH TWICE DAILY   atorvastatin (LIPITOR) 80 MG tablet TAKE 1 TABLET DAILY   metoprolol tartrate (LOPRESSOR) 25 MG tablet Take 1 tablet (25 mg total) by mouth 2 (two) times daily.   Multiple Vitamin (MULTIVITAMIN) capsule Take 1 capsule by mouth daily.   Omega-3 Fatty Acids (FISH OIL) 1000 MG CAPS Take 1,000 mg by mouth daily.     Allergies:   Patient has no known allergies.   Social History   Socioeconomic History   Marital status: Married    Spouse name: Not on file   Number of children: Not on file   Years of education: Not on file   Highest education level: Master's degree (  e.g., MA, MS, MEng, MEd, MSW, MBA)  Occupational History   Occupation: working fulltime   Tobacco Use   Smoking status: Former    Packs/day: 0.00    Years: 0.00    Total pack years: 0.00    Types: Cigarettes    Quit date: 2000    Years since quitting: 23.5   Smokeless tobacco: Never   Tobacco comments:    social smoker when younger   Vaping Use   Vaping Use: Never used  Substance and Sexual Activity   Alcohol use: Yes    Alcohol/week: 5.0 standard drinks of alcohol    Types: 5 Glasses of wine per week   Drug use: No   Sexual activity: Yes    Birth  control/protection: Surgical  Other Topics Concern   Not on file  Social History Theatre stage manager, local friends.    Social Determinants of Health   Financial Resource Strain: Low Risk  (07/25/2021)   Overall Financial Resource Strain (CARDIA)    Difficulty of Paying Living Expenses: Not hard at all  Food Insecurity: No Food Insecurity (07/25/2021)   Hunger Vital Sign    Worried About Running Out of Food in the Last Year: Never true    Ran Out of Food in the Last Year: Never true  Transportation Needs: No Transportation Needs (07/25/2021)   PRAPARE - Hydrologist (Medical): No    Lack of Transportation (Non-Medical): No  Physical Activity: Sufficiently Active (07/25/2021)   Exercise Vital Sign    Days of Exercise per Week: 5 days    Minutes of Exercise per Session: 60 min  Stress: No Stress Concern Present (07/25/2021)   [place]    Feeling of Stress : Not at all  Social Connections: Moderately Integrated (07/25/2021)   Social Connection and Isolation Panel [NHANES]    Frequency of Communication with Friends and Family: More than three times a week    Frequency of Social Gatherings with Friends and Family: Three times a week    Attends Religious Services: Never    Active Member of Clubs or Organizations: Yes    Attends Music therapist: More than 4 times per year    Marital Status: Married     Family History: The patient's family history includes Diabetes in his father; Heart attack in his maternal grandfather; Hypertension in his father; Lung cancer in his mother; Transient ischemic attack in his father. There is no history of Prostate cancer, Bladder Cancer, or Kidney cancer.  ROS:   Please see the history of present illness.     All other systems reviewed and are negative.  EKGs/Labs/Other Studies Reviewed:    The following studies were reviewed today:  Echo  02/13/21    1. Aneurysmal with PFO on prior TEE. Post closure with 18 mm Aplatzer  device On 4 chamber and PSSA images no residual PFO Subcostal images poor.   2. Right atrial size was mildly dilated.   3. The mitral valve is abnormal.   4. The aortic valve is tricuspid. Aortic valve regurgitation is mild.  Mild to moderate aortic valve sclerosis/calcification is present, without  any evidence of aortic stenosis.   Cardiac Cath 01/2021 Successful transcatheter PFO closure under intracardiac echo and fluoroscopic guidance using an 18 mm Amplatzer atrial septal occluder device.  Recommendations: Limited 2D echocardiogram prior to discharge Dual antiplatelet therapy for 6 months with aspirin and clopidogrel SBE  prophylaxis x6 months when indicated Same-day discharge if criteria met  Echo TEE 12/2020   1. Left ventricular ejection fraction, by estimation, is 60 to 65%. The  left ventricle has normal function.   2. Right ventricular systolic function is normal. The right ventricular  size is normal.   3. No left atrial/left atrial appendage thrombus was detected.   4. The interatrial septum is aneurysmal with bowing both into L and R  atrium There is a large PFO that is easily seen with color doppler with L  to R flow. Alos shown when tested with injection of agitated saline with  appearance of bubbles seen in left  sided chambers .   5. The mitral valve is normal in structure. Mild mitral valve  regurgitation.   6. Aortic valve regurgitation is trivial.   7. Minimal fixed plaquing in the thoracic aorta.  EKG:  EKG is  ordered today.  The ekg ordered today demonstrates Afib RVR, 113bpm, LAFB, no significant ST/T wave changes  Recent Labs: 03/20/2021: Magnesium 2.0; TSH 1.250 04/29/2021: Hemoglobin 14.2; Platelets 168 06/28/2021: ALT 49; BUN 21; Creatinine, Ser 1.20; Potassium 4.1; Sodium 141  Recent Lipid Panel    Component Value Date/Time   CHOL 138 06/28/2021 0901   TRIG 77  06/28/2021 0901   HDL 72 06/28/2021 0901   LDLCALC 51 06/28/2021 0901    Physical Exam:    VS:  BP 122/68 (BP Location: Left Arm, Patient Position: Sitting, Cuff Size: Large)   Pulse (!) 113   Ht _0  (1.905 m)   Wt 270 lb (122.5 kg)   SpO2 98%   BMI 33.75 kg/m     Wt Readings from Last 3 Encounters:  12/12/21 270 lb (122.5 kg)  09/19/21 264 lb (119.7 kg)  06/28/21 263 lb 11.2 oz (119.6 kg)     GEN:  Well nourished, well developed in no acute distress HEENT: Normal NECK: No JVD; No carotid bruits LYMPHATICS: No lymphadenopathy CARDIAC: Irreg Irreg, no murmurs, rubs, gallops RESPIRATORY:  Clear to auscultation without rales, wheezing or rhonchi  ABDOMEN: Soft, non-tender, non-distended MUSCULOSKELETAL:  No edema; No deformity  SKIN: Warm and dry NEUROLOGIC:  Alert and oriented x 3 PSYCHIATRIC:  Normal affect   ASSESSMENT:    1. Persistent atrial fibrillation ([place])   2. History of stroke   3. PFO (patent foramen ovale)   4. Essential hypertension    PLAN:    In order of problems listed above:  Persistent Afib Patient underwent successful cardioversion in December. Today he is back in Afib RVR with a heart rate of 113bpm. He is fairly asymptomatic. Metoprolol was previously discontinued. He remains on Eliquis 68m BID and denies missing doses. I will restart Lopressor 270mID. I will check CBC, BMET, Mag, TSH today. I will see him back in a week, may need to undergo repeat cardioversion.   H/o stroke and PFO status post-closure No new neurological defects. Most recent LDL was 32. Continue statin therapy.   HTN BP is good today. Start Lopressor as above. Continue amlodipine 80m69maily.   Disposition: Follow up in 1 week(s) with MD/APP    Signed, Shynice Sigel H FNinfa MeekerA-C  12/12/2021 11:59 AM    [facility]

## 2021-12-19 ENCOUNTER — Encounter: Payer: Self-pay | Admitting: Medical

## 2021-12-19 ENCOUNTER — Ambulatory Visit (INDEPENDENT_AMBULATORY_CARE_PROVIDER_SITE_OTHER): Payer: Medicare Other | Admitting: Medical

## 2021-12-19 VITALS — BP 118/78 | HR 108 | Ht 75.0 in | Wt 269.0 lb

## 2021-12-19 DIAGNOSIS — I253 Aneurysm of heart: Secondary | ICD-10-CM

## 2021-12-19 DIAGNOSIS — I10 Essential (primary) hypertension: Secondary | ICD-10-CM

## 2021-12-19 DIAGNOSIS — I4819 Other persistent atrial fibrillation: Secondary | ICD-10-CM | POA: Diagnosis not present

## 2021-12-19 DIAGNOSIS — Q2112 Patent foramen ovale: Secondary | ICD-10-CM

## 2021-12-19 DIAGNOSIS — Z8673 Personal history of transient ischemic attack (TIA), and cerebral infarction without residual deficits: Secondary | ICD-10-CM | POA: Diagnosis not present

## 2021-12-19 MED ORDER — METOPROLOL TARTRATE 25 MG PO TABS: 50.0000 mg | ORAL_TABLET | Freq: Two times a day (BID) | ORAL | Status: DC

## 2021-12-19 NOTE — H&P (View-Only) (Signed)
Cardiology Office Note:    Date:  12/19/2021   ID:  [name], DOB 06/03/1950, MRN [id number]  PCP:  Valerie Roys, DO  [facility] Cardiologist:  Nelva Bush, MD  [facility] Electrophysiologist:  None   Referring MD: Valerie Roys, DO   Chief Complaint: Afib  History of Present Illness:    Zachary Rogers is a 71 y.o. male with a hx of of stroke with subsequent discovery of large PFO s/p PFO closure, Afib s/p DCCV, HTN, HLD, nephrolithiasis.    Seen in November 2023 at which time he was still in Afib. He underwent cardioversion in December.    seen 05/31/21 and was in NSR. Suspected afib related to PFO closure. Eliquis was continued. Metoprolol was previously discontinued.  Last seen 12/12/21 and was in afib RVR with HR 113bpm. Labs were ordered and Lopressor was restarted.   Today, the patient is still in Afib with a HR of 108bpm. He denies missed doses of Eliquis. Says heart rate has been high throughout the week. He feels fine, no significant symptoms. Just has some trouble sleeping. He is agreeable to undergo cardioversion.   Past Medical History:  Diagnosis Date   Actinic keratosis    R lat neck - bx proven with verruca vulgaris   Cerebrovascular accident (CVA) ([place]) 10/25/2020   Dysplastic nevus 06/22/2017   L mid back parapinal -severe, excision 10/06/2017   Hypertension    Lateral epicondylitis (tennis elbow) 09/04/2020   Mixed hyperlipidemia 07/18/2019   Nephrolithiasis     Past Surgical History:  Procedure Laterality Date   BUBBLE STUDY  12/17/2020   Procedure: BUBBLE STUDY;  Surgeon: Fay Records, MD;  Location: [place];  Service: Cardiovascular;;   CARDIOVERSION N/A 04/30/2021   Procedure: CARDIOVERSION;  Surgeon: Nelva Bush, MD;  Location: [facility];  Service: Cardiovascular;  Laterality: N/A;   CHOLECYSTECTOMY  06/07/1998   COLONOSCOPY     COLONOSCOPY WITH PROPOFOL N/A 11/09/2018   Procedure: COLONOSCOPY WITH PROPOFOL;   Surgeon: [name], MD;  Location: [facility] ENDOSCOPY;  Service: Gastroenterology;  Laterality: N/A;   PATENT FORAMEN OVALE(PFO) CLOSURE N/A 02/13/2021   Procedure: PATENT FORAMEN OVALE (PFO) CLOSURE;  Surgeon: Sherren Mocha, MD;  Location: [place] CV LAB;  Service: Cardiovascular;  Laterality: N/A;   SKIN LESION EXCISION     dermatlogy - precancerous lesion 1 year ago on back    TEE WITHOUT CARDIOVERSION N/A 12/17/2020   Procedure: TRANSESOPHAGEAL ECHOCARDIOGRAM (TEE);  Surgeon: Fay Records, MD;  Location: [facility] ENDOSCOPY;  Service: Cardiovascular;  Laterality: N/A;    Current Medications: Current Meds  Medication Sig   amLODipine (NORVASC) 5 MG tablet TAKE 1 TABLET DAILY   apixaban (ELIQUIS) 5 MG TABS tablet TAKE 1 TABLET(5 MG) BY MOUTH TWICE DAILY   atorvastatin (LIPITOR) 80 MG tablet TAKE 1 TABLET DAILY   metoprolol tartrate (LOPRESSOR) 25 MG tablet Take 1 tablet (25 mg total) by mouth 2 (two) times daily.   Multiple Vitamin (MULTIVITAMIN) capsule Take 1 capsule by mouth daily.   Omega-3 Fatty Acids (FISH OIL) 1000 MG CAPS Take 1,000 mg by mouth daily.     Allergies:   Patient has no known allergies.   Social History   Socioeconomic History   Marital status: Married    Spouse name: Not on file   Number of children: Not on file   Years of education: Not on file   Highest education level: Master's degree (e.g., MA, MS, MEng, MEd,  MSW, MBA)  Occupational History   Occupation: working fulltime   Tobacco Use   Smoking status: Former    Packs/day: 0.00    Years: 0.00    Total pack years: 0.00    Types: Cigarettes    Quit date: 2000    Years since quitting: 23.6   Smokeless tobacco: Never   Tobacco comments:    social smoker when younger   Vaping Use   Vaping Use: Never used  Substance and Sexual Activity   Alcohol use: Yes    Alcohol/week: 5.0 standard drinks of alcohol    Types: 5 Glasses of wine per week   Drug use: No   Sexual activity: Yes    Birth  control/protection: Surgical  Other Topics Concern   Not on file  Social History Theatre stage manager, local friends.    Social Determinants of Health   Financial Resource Strain: Low Risk  (07/25/2021)   Overall Financial Resource Strain (CARDIA)    Difficulty of Paying Living Expenses: Not hard at all  Food Insecurity: No Food Insecurity (07/25/2021)   Hunger Vital Sign    Worried About Running Out of Food in the Last Year: Never true    Ran Out of Food in the Last Year: Never true  Transportation Needs: No Transportation Needs (07/25/2021)   PRAPARE - Hydrologist (Medical): No    Lack of Transportation (Non-Medical): No  Physical Activity: Sufficiently Active (07/25/2021)   Exercise Vital Sign    Days of Exercise per Week: 5 days    Minutes of Exercise per Session: 60 min  Stress: No Stress Concern Present (07/25/2021)   [place]    Feeling of Stress : Not at all  Social Connections: Moderately Integrated (07/25/2021)   Social Connection and Isolation Panel [NHANES]    Frequency of Communication with Friends and Family: More than three times a week    Frequency of Social Gatherings with Friends and Family: Three times a week    Attends Religious Services: Never    Active Member of Clubs or Organizations: Yes    Attends Music therapist: More than 4 times per year    Marital Status: Married     Family History: The patient's family history includes Diabetes in his father; Heart attack in his maternal grandfather; Hypertension in his father; Lung cancer in his mother; Transient ischemic attack in his father. There is no history of Prostate cancer, Bladder Cancer, or Kidney cancer.  ROS:   Please see the history of present illness.     All other systems reviewed and are negative.  EKGs/Labs/Other Studies Reviewed:    The following studies were reviewed today:  Echo  02/13/21    1. Aneurysmal with PFO on prior TEE. Post closure with 18 mm Aplatzer  device On 4 chamber and PSSA images no residual PFO Subcostal images poor.   2. Right atrial size was mildly dilated.   3. The mitral valve is abnormal.   4. The aortic valve is tricuspid. Aortic valve regurgitation is mild.  Mild to moderate aortic valve sclerosis/calcification is present, without  any evidence of aortic stenosis.    Cardiac Cath 01/2021 Successful transcatheter PFO closure under intracardiac echo and fluoroscopic guidance using an 18 mm Amplatzer atrial septal occluder device.  Recommendations: Limited 2D echocardiogram prior to discharge Dual antiplatelet therapy for 6 months with aspirin and clopidogrel SBE prophylaxis x6 months when  indicated Same-day discharge if criteria met   Echo TEE 12/2020   1. Left ventricular ejection fraction, by estimation, is 60 to 65%. The  left ventricle has normal function.   2. Right ventricular systolic function is normal. The right ventricular  size is normal.   3. No left atrial/left atrial appendage thrombus was detected.   4. The interatrial septum is aneurysmal with bowing both into L and R  atrium There is a large PFO that is easily seen with color doppler with L  to R flow. Alos shown when tested with injection of agitated saline with  appearance of bubbles seen in left  sided chambers .   5. The mitral valve is normal in structure. Mild mitral valve  regurgitation.   6. Aortic valve regurgitation is trivial.   7. Minimal fixed plaquing in the thoracic aorta.    EKG:  EKG is ordered today.  The ekg ordered today demonstrates Afib RVR 108bpm, iRBBB, LAD, LAFB, nonspecific T wave changes  Recent Labs: 06/28/2021: ALT 49 12/12/2021: BUN 20; Creatinine, Ser 1.38; Hemoglobin 15.1; Magnesium 2.1; Platelets 179; Potassium 3.6; Sodium 141; TSH 1.442  Recent Lipid Panel    Component Value Date/Time   CHOL 138 06/28/2021 0901   TRIG 77  06/28/2021 0901   HDL 72 06/28/2021 0901   LDLCALC 51 06/28/2021 0901    Physical Exam:    VS:  BP 118/78 (BP Location: Left Arm, Patient Position: Sitting, Cuff Size: Large)   Pulse (!) 108   Ht $R'6\' 3"'cG$  (1.905 m)   Wt 269 lb (122 kg)   SpO2 98%   BMI 33.62 kg/m     Wt Readings from Last 3 Encounters:  12/19/21 269 lb (122 kg)  12/12/21 270 lb (122.5 kg)  09/19/21 264 lb (119.7 kg)     GEN:  Well nourished, well developed in no acute distress HEENT: Normal NECK: No JVD; No carotid bruits LYMPHATICS: No lymphadenopathy CARDIAC: Irreg Irreg, no murmurs, rubs, gallops RESPIRATORY:  Clear to auscultation without rales, wheezing or rhonchi  ABDOMEN: Soft, non-tender, non-distended MUSCULOSKELETAL:  No edema; No deformity  SKIN: Warm and dry NEUROLOGIC:  Alert and oriented x 3 PSYCHIATRIC:  Normal affect   ASSESSMENT:    1. Persistent atrial fibrillation ([place])   2. PFO with atrial septal aneurysm   3. History of stroke   4. Essential hypertension    PLAN:    In order of problems listed above:  Persistent Afib Patient remains in afib with a heart rate of 108bpm despite addition of Lopressor. He denies missing doses of Eliquis. No significant symptoms. We will set him up for cardioversion. I will increase Lopressor to $RemoveBefo'50mg'FVYTTQudTCM$ BID, this will likely need to be decreased post-cardioversion. Continue Eliquis $RemoveBeforeDE'5mg'SbqwSWrDmHBftGz$  BID for stroke ppx. We will see him back after this procedure.   H/o stroke and PFO closure status No new neurological defects. Most recent LDL was 32. Continue statin therapy.   HTN BP good today. Increase Lopressor as above. Continue amlodipine.   Disposition: Follow up in 3 week(s) with MD/APP   Signed, Tayron Hunnell Ninfa Meeker, PA-C  12/19/2021 1:56 PM    [facility]

## 2021-12-19 NOTE — Patient Instructions (Signed)
Medication Instructions:   Your physician has recommended you make the following change in your medication:   INCREASE metoprolol tartrate 50 mg TWICE daily - Take 2 tablets of current dose until need new Rx  *If you need a refill on your cardiac medications before your next appointment, please call your pharmacy*   Lab Work:  None ordered  Testing/Procedures:  You are scheduled for a Cardioversion on Tuesday 12/24/21 with Dr. Fletcher Anon.  Please arrive at the [place] [facility] at 6:30 a.m. on the day of your procedure.  DIET INSTRUCTIONS:   Nothing to eat or drink after midnight except your medications with a sip of water.         Labs: None needed   Medications:  YOU MAY TAKE ALL of your regular medications with a small amount of water.  Must have a responsible person to drive you home.  Bring a current list of your medications and current insurance cards.    If you have any questions after you get home, please call the office at [phone]    Follow-Up: At [facility], you and your health needs are our priority.  As part of our continuing mission to provide you with exceptional heart care, we have created designated Provider Care Teams.  These Care Teams include your primary Cardiologist (physician) and Advanced Practice Providers (APPs -  Physician Assistants and Nurse Practitioners) who all work together to provide you with the care you need, when you need it.  We recommend signing up for the patient portal called "MyChart".  Sign up information is provided on this After Visit Summary.  MyChart is used to connect with patients for Virtual Visits (Telemedicine).  Patients are able to view lab/test results, encounter notes, upcoming appointments, etc.  Non-urgent messages can be sent to your provider as well.   To learn more about what you can do with MyChart, go to [email or web address].    Your next appointment:    Follow up after cardioversion  The format for your  next appointment:   In Person  Provider:   You may see Nelva Bush, MD or one of the following Advanced Practice Providers on your designated Care Team:   Murray Hodgkins, NP Christell [place], PA-C Cadence Kathlen Mody, PA-C{   Important Information About Sugar

## 2021-12-19 NOTE — Progress Notes (Signed)
Cardiology Office Note:    Date:  12/19/2021   ID:  Zachary Rogers, DOB 11-14-1950, MRN [id number]  PCP:  Valerie Roys, DO  [facility] Cardiologist:  Nelva Bush, MD  [facility] Electrophysiologist:  None   Referring MD: Valerie Roys, DO   Chief Complaint: Afib  History of Present Illness:    Zachary Rogers is a 71 y.o. male with a hx of of stroke with subsequent discovery of large PFO s/p PFO closure, Afib s/p DCCV, HTN, HLD, nephrolithiasis.    Seen in November 2023 at which time he was still in Afib. He underwent cardioversion in December.    seen 05/31/21 and was in NSR. Suspected afib related to PFO closure. Eliquis was continued. Metoprolol was previously discontinued.  Last seen 12/12/21 and was in afib RVR with HR 113bpm. Labs were ordered and Lopressor was restarted.   Today, the patient is still in Afib with a HR of 108bpm. He denies missed doses of Eliquis. Says heart rate has been high throughout the week. He feels fine, no significant symptoms. Just has some trouble sleeping. He is agreeable to undergo cardioversion.   Past Medical History:  Diagnosis Date   Actinic keratosis    R lat neck - bx proven with verruca vulgaris   Cerebrovascular accident (CVA) ([place]) 10/25/2020   Dysplastic nevus 06/22/2017   L mid back parapinal -severe, excision 10/06/2017   Hypertension    Lateral epicondylitis (tennis elbow) 09/04/2020   Mixed hyperlipidemia 07/18/2019   Nephrolithiasis     Past Surgical History:  Procedure Laterality Date   BUBBLE STUDY  12/17/2020   Procedure: BUBBLE STUDY;  Surgeon: Fay Records, MD;  Location: [place];  Service: Cardiovascular;;   CARDIOVERSION N/A 04/30/2021   Procedure: CARDIOVERSION;  Surgeon: Nelva Bush, MD;  Location: [facility];  Service: Cardiovascular;  Laterality: N/A;   CHOLECYSTECTOMY  06/07/1998   COLONOSCOPY     COLONOSCOPY WITH PROPOFOL N/A 11/09/2018   Procedure: COLONOSCOPY WITH PROPOFOL;   Surgeon: [name], MD;  Location: [facility] ENDOSCOPY;  Service: Gastroenterology;  Laterality: N/A;   PATENT FORAMEN OVALE(PFO) CLOSURE N/A 02/13/2021   Procedure: PATENT FORAMEN OVALE (PFO) CLOSURE;  Surgeon: Sherren Mocha, MD;  Location: [place] CV LAB;  Service: Cardiovascular;  Laterality: N/A;   SKIN LESION EXCISION     dermatlogy - precancerous lesion 1 year ago on back    TEE WITHOUT CARDIOVERSION N/A 12/17/2020   Procedure: TRANSESOPHAGEAL ECHOCARDIOGRAM (TEE);  Surgeon: Fay Records, MD;  Location: [facility] ENDOSCOPY;  Service: Cardiovascular;  Laterality: N/A;    Current Medications: Current Meds  Medication Sig   amLODipine (NORVASC) 5 MG tablet TAKE 1 TABLET DAILY   apixaban (ELIQUIS) 5 MG TABS tablet TAKE 1 TABLET(5 MG) BY MOUTH TWICE DAILY   atorvastatin (LIPITOR) 80 MG tablet TAKE 1 TABLET DAILY   metoprolol tartrate (LOPRESSOR) 25 MG tablet Take 1 tablet (25 mg total) by mouth 2 (two) times daily.   Multiple Vitamin (MULTIVITAMIN) capsule Take 1 capsule by mouth daily.   Omega-3 Fatty Acids (FISH OIL) 1000 MG CAPS Take 1,000 mg by mouth daily.     Allergies:   Patient has no known allergies.   Social History   Socioeconomic History   Marital status: Married    Spouse name: Not on file   Number of children: Not on file   Years of education: Not on file   Highest education level: Master's degree (e.g., MA, MS, MEng, MEd,  MSW, MBA)  Occupational History   Occupation: working fulltime   Tobacco Use   Smoking status: Former    Packs/day: 0.00    Years: 0.00    Total pack years: 0.00    Types: Cigarettes    Quit date: 2000    Years since quitting: 23.6   Smokeless tobacco: Never   Tobacco comments:    social smoker when younger   Vaping Use   Vaping Use: Never used  Substance and Sexual Activity   Alcohol use: Yes    Alcohol/week: 5.0 standard drinks of alcohol    Types: 5 Glasses of wine per week   Drug use: No   Sexual activity: Yes    Birth  control/protection: Surgical  Other Topics Concern   Not on file  Social History Theatre stage manager, local friends.    Social Determinants of Health   Financial Resource Strain: Low Risk  (07/25/2021)   Overall Financial Resource Strain (CARDIA)    Difficulty of Paying Living Expenses: Not hard at all  Food Insecurity: No Food Insecurity (07/25/2021)   Hunger Vital Sign    Worried About Running Out of Food in the Last Year: Never true    Ran Out of Food in the Last Year: Never true  Transportation Needs: No Transportation Needs (07/25/2021)   PRAPARE - Hydrologist (Medical): No    Lack of Transportation (Non-Medical): No  Physical Activity: Sufficiently Active (07/25/2021)   Exercise Vital Sign    Days of Exercise per Week: 5 days    Minutes of Exercise per Session: 60 min  Stress: No Stress Concern Present (07/25/2021)   [place]    Feeling of Stress : Not at all  Social Connections: Moderately Integrated (07/25/2021)   Social Connection and Isolation Panel [NHANES]    Frequency of Communication with Friends and Family: More than three times a week    Frequency of Social Gatherings with Friends and Family: Three times a week    Attends Religious Services: Never    Active Member of Clubs or Organizations: Yes    Attends Music therapist: More than 4 times per year    Marital Status: Married     Family History: The patient's family history includes Diabetes in his father; Heart attack in his maternal grandfather; Hypertension in his father; Lung cancer in his mother; Transient ischemic attack in his father. There is no history of Prostate cancer, Bladder Cancer, or Kidney cancer.  ROS:   Please see the history of present illness.     All other systems reviewed and are negative.  EKGs/Labs/Other Studies Reviewed:    The following studies were reviewed today:  Echo  02/13/21    1. Aneurysmal with PFO on prior TEE. Post closure with 18 mm Aplatzer  device On 4 chamber and PSSA images no residual PFO Subcostal images poor.   2. Right atrial size was mildly dilated.   3. The mitral valve is abnormal.   4. The aortic valve is tricuspid. Aortic valve regurgitation is mild.  Mild to moderate aortic valve sclerosis/calcification is present, without  any evidence of aortic stenosis.    Cardiac Cath 01/2021 Successful transcatheter PFO closure under intracardiac echo and fluoroscopic guidance using an 18 mm Amplatzer atrial septal occluder device.  Recommendations: Limited 2D echocardiogram prior to discharge Dual antiplatelet therapy for 6 months with aspirin and clopidogrel SBE prophylaxis x6 months when  indicated Same-day discharge if criteria met   Echo TEE 12/2020   1. Left ventricular ejection fraction, by estimation, is 60 to 65%. The  left ventricle has normal function.   2. Right ventricular systolic function is normal. The right ventricular  size is normal.   3. No left atrial/left atrial appendage thrombus was detected.   4. The interatrial septum is aneurysmal with bowing both into L and R  atrium There is a large PFO that is easily seen with color doppler with L  to R flow. Alos shown when tested with injection of agitated saline with  appearance of bubbles seen in left  sided chambers .   5. The mitral valve is normal in structure. Mild mitral valve  regurgitation.   6. Aortic valve regurgitation is trivial.   7. Minimal fixed plaquing in the thoracic aorta.    EKG:  EKG is ordered today.  The ekg ordered today demonstrates Afib RVR 108bpm, iRBBB, LAD, LAFB, nonspecific T wave changes  Recent Labs: 06/28/2021: ALT 49 12/12/2021: BUN 20; Creatinine, Ser 1.38; Hemoglobin 15.1; Magnesium 2.1; Platelets 179; Potassium 3.6; Sodium 141; TSH 1.442  Recent Lipid Panel    Component Value Date/Time   CHOL 138 06/28/2021 0901   TRIG 77  06/28/2021 0901   HDL 72 06/28/2021 0901   LDLCALC 51 06/28/2021 0901    Physical Exam:    VS:  BP 118/78 (BP Location: Left Arm, Patient Position: Sitting, Cuff Size: Large)   Pulse (!) 108   Ht $R'6\' 3"'mR$  (1.905 m)   Wt 269 lb (122 kg)   SpO2 98%   BMI 33.62 kg/m     Wt Readings from Last 3 Encounters:  12/19/21 269 lb (122 kg)  12/12/21 270 lb (122.5 kg)  09/19/21 264 lb (119.7 kg)     GEN:  Well nourished, well developed in no acute distress HEENT: Normal NECK: No JVD; No carotid bruits LYMPHATICS: No lymphadenopathy CARDIAC: Irreg Irreg, no murmurs, rubs, gallops RESPIRATORY:  Clear to auscultation without rales, wheezing or rhonchi  ABDOMEN: Soft, non-tender, non-distended MUSCULOSKELETAL:  No edema; No deformity  SKIN: Warm and dry NEUROLOGIC:  Alert and oriented x 3 PSYCHIATRIC:  Normal affect   ASSESSMENT:    1. Persistent atrial fibrillation ([place])   2. PFO with atrial septal aneurysm   3. History of stroke   4. Essential hypertension    PLAN:    In order of problems listed above:  Persistent Afib Patient remains in afib with a heart rate of 108bpm despite addition of Lopressor. He denies missing doses of Eliquis. No significant symptoms. We will set him up for cardioversion. I will increase Lopressor to $RemoveBefo'50mg'ImOPkcnBZby$ BID, this will likely need to be decreased post-cardioversion. Continue Eliquis $RemoveBeforeDE'5mg'kWFHYkZmuUDyMzM$  BID for stroke ppx. We will see him back after this procedure.   H/o stroke and PFO closure status No new neurological defects. Most recent LDL was 32. Continue statin therapy.   HTN BP good today. Increase Lopressor as above. Continue amlodipine.   Disposition: Follow up in 3 week(s) with MD/APP   Signed, Terrilee Dudzik Ninfa Meeker, PA-C  12/19/2021 1:56 PM    [facility]

## 2021-12-24 ENCOUNTER — Ambulatory Visit: Payer: Medicare Other | Admitting: Registered Nurse

## 2021-12-24 ENCOUNTER — Ambulatory Visit
Admission: RE | Admit: 2021-12-24 | Discharge: 2021-12-24 | Disposition: A | Discharge status: Home / Self Care | Payer: Medicare Other | Attending: Cardiovascular Disease | Admitting: Cardiovascular Disease

## 2021-12-24 ENCOUNTER — Encounter: Payer: Self-pay | Admitting: Cardiovascular Disease

## 2021-12-24 ENCOUNTER — Encounter
Admission: RE | Disposition: A | Discharge status: Home / Self Care | Payer: Self-pay | Source: Home / Self Care | Attending: Cardiovascular Disease

## 2021-12-24 DIAGNOSIS — I4891 Unspecified atrial fibrillation: Secondary | ICD-10-CM | POA: Diagnosis not present

## 2021-12-24 DIAGNOSIS — Z8774 Personal history of (corrected) congenital malformations of heart and circulatory system: Secondary | ICD-10-CM | POA: Diagnosis not present

## 2021-12-24 DIAGNOSIS — Z87891 Personal history of nicotine dependence: Secondary | ICD-10-CM | POA: Insufficient documentation

## 2021-12-24 DIAGNOSIS — I2510 Atherosclerotic heart disease of native coronary artery without angina pectoris: Secondary | ICD-10-CM | POA: Insufficient documentation

## 2021-12-24 DIAGNOSIS — Z7901 Long term (current) use of anticoagulants: Secondary | ICD-10-CM | POA: Diagnosis not present

## 2021-12-24 DIAGNOSIS — Z79899 Other long term (current) drug therapy: Secondary | ICD-10-CM | POA: Insufficient documentation

## 2021-12-24 DIAGNOSIS — I10 Essential (primary) hypertension: Secondary | ICD-10-CM | POA: Insufficient documentation

## 2021-12-24 DIAGNOSIS — E782 Mixed hyperlipidemia: Secondary | ICD-10-CM | POA: Diagnosis not present

## 2021-12-24 DIAGNOSIS — I4819 Other persistent atrial fibrillation: Secondary | ICD-10-CM | POA: Diagnosis not present

## 2021-12-24 DIAGNOSIS — Z8673 Personal history of transient ischemic attack (TIA), and cerebral infarction without residual deficits: Secondary | ICD-10-CM | POA: Diagnosis not present

## 2021-12-24 HISTORY — PX: CARDIOVERSION: SHX1299

## 2021-12-24 SURGERY — CARDIOVERSION
Anesthesia: General

## 2021-12-24 MED ORDER — PROPOFOL 10 MG/ML IV BOLUS
INTRAVENOUS | Status: DC | PRN
  Administered 2021-12-24: 60 mg via INTRAVENOUS

## 2021-12-24 MED ORDER — METOPROLOL TARTRATE 25 MG PO TABS: 25.0000 mg | ORAL_TABLET | Freq: Two times a day (BID) | ORAL | Status: DC

## 2021-12-24 MED ORDER — SODIUM CHLORIDE 0.9 % IV SOLN: INTRAVENOUS | Status: DC

## 2021-12-24 MED ORDER — PROPOFOL 10 MG/ML IV BOLUS
INTRAVENOUS | Status: AC
  Filled 2021-12-24: qty 40

## 2021-12-24 NOTE — Anesthesia Preprocedure Evaluation (Signed)
Anesthesia Evaluation  Patient identified by MRN, date of birth, ID band Patient awake    Reviewed: Allergy & Precautions, NPO status , Patient's Chart, lab work & pertinent test results  History of Anesthesia Complications Negative for: history of anesthetic complications  Airway Mallampati: III  TM Distance: <3 FB Neck ROM: full    Dental  (+) Chipped   Pulmonary neg pulmonary ROS, neg shortness of breath, former smoker,    Pulmonary exam normal        Cardiovascular Exercise Tolerance: Good hypertension, + CAD  + dysrhythmias Atrial Fibrillation  Rhythm:irregular Rate:Normal     Neuro/Psych CVA negative psych ROS   GI/Hepatic negative GI ROS, Neg liver ROS,   Endo/Other  negative endocrine ROS  Renal/GU Renal disease  negative genitourinary   Musculoskeletal   Abdominal   Peds  Hematology negative hematology ROS (+)   Anesthesia Other Findings Past Medical History: No date: Actinic keratosis     Comment:  R lat neck - bx proven with verruca vulgaris 10/25/2020: Cerebrovascular accident (CVA) ([place]) 06/22/2017: Dysplastic nevus     Comment:  L mid back parapinal -severe, excision 10/06/2017 No date: Hypertension 09/04/2020: Lateral epicondylitis (tennis elbow) 07/18/2019: Mixed hyperlipidemia No date: Nephrolithiasis  Past Surgical History: 12/17/2020: BUBBLE STUDY     Comment:  Procedure: BUBBLE STUDY;  Surgeon: Fay Records, MD;                Location: [place];  Service: Cardiovascular;; 04/30/2021: CARDIOVERSION; N/A     Comment:  Procedure: CARDIOVERSION;  Surgeon: Nelva Bush,               MD;  Location: [facility];  Service: Cardiovascular;                Laterality: N/A; 06/07/1998: CHOLECYSTECTOMY No date: COLONOSCOPY 11/09/2018: COLONOSCOPY WITH PROPOFOL; N/A     Comment:  Procedure: COLONOSCOPY WITH PROPOFOL;  Surgeon: [name], MD;  Location: [facility] ENDOSCOPY;   Service:               Gastroenterology;  Laterality: N/A; 02/13/2021: PATENT FORAMEN OVALE(PFO) CLOSURE; N/A     Comment:  Procedure: PATENT FORAMEN OVALE (PFO) CLOSURE;  Surgeon:              Sherren Mocha, MD;  Location: [place] CV [facility];                Service: Cardiovascular;  Laterality: N/A; No date: SKIN LESION EXCISION     Comment:  dermatlogy - precancerous lesion 1 year ago on back  12/17/2020: TEE WITHOUT CARDIOVERSION; N/A     Comment:  Procedure: TRANSESOPHAGEAL ECHOCARDIOGRAM (TEE);                Surgeon: Fay Records, MD;  Location: [facility] ENDOSCOPY;                Service: Cardiovascular;  Laterality: N/A;  BMI    Body Mass Index: 33.75 kg/m      Reproductive/Obstetrics negative OB ROS                             Anesthesia Physical Anesthesia Plan  ASA: 3  Anesthesia Plan: General   Post-op Pain Management:    Induction: Intravenous  PONV Risk Score and Plan: Propofol infusion and TIVA  Airway Management Planned: Natural Airway and Nasal  Cannula  Additional Equipment:   Intra-op Plan:   Post-operative Plan:   Informed Consent: I have reviewed the patients History and Physical, chart, labs and discussed the procedure including the risks, benefits and alternatives for the proposed anesthesia with the patient or authorized representative who has indicated his/her understanding and acceptance.     Dental Advisory Given  Plan Discussed with: Anesthesiologist, CRNA and Surgeon  Anesthesia Plan Comments: (Patient consented for risks of anesthesia including but not limited to:  - adverse reactions to medications - risk of airway placement if required - damage to eyes, teeth, lips or other oral mucosa - nerve damage due to positioning  - sore throat or hoarseness - Damage to heart, brain, nerves, lungs, other parts of body or loss of life  Patient voiced understanding.)        Anesthesia Quick Evaluation

## 2021-12-24 NOTE — CV Procedure (Signed)
Cardioversion note: A standard informed consent was obtained. Timeout was performed. The pads were placed in the anterior posterior fashion. The patient was given propofol by the anesthesia team.  Successful cardioversion was performed with a 200 J. The patient converted to sinus bradycardia Pre-and post EKGs were reviewed. The patient tolerated the procedure with no immediate complications.  Recommendations: Decrease metoprolol to 25 mg twice daily given sinus bradycardia post cardioversion. Continue anticoagulation with Eliquis. Consider an antiarrhythmic medication or A-fib ablation.

## 2021-12-24 NOTE — Interval H&P Note (Signed)
History and Physical Interval Note:  12/24/2021 7:46 AM  Zachary Rogers  has presented today for surgery, with the diagnosis of Cardioversion   AFib.  The various methods of treatment have been discussed with the patient and family. After consideration of risks, benefits and other options for treatment, the patient has consented to  Procedure(s): CARDIOVERSION (N/A) as a surgical intervention.  The patient's history has been reviewed, patient examined, no change in status, stable for surgery.  I have reviewed the patient's chart and labs.  Questions were answered to the patient's satisfaction.     Kathlyn Sacramento

## 2021-12-24 NOTE — Transfer of Care (Signed)
Immediate Anesthesia Transfer of Care Note  Patient: Zachary Rogers  Procedure(s) Performed: CARDIOVERSION  Patient Location: Special Procedures  Anesthesia Type:General  Level of Consciousness: drowsy and patient cooperative  Airway & Oxygen Therapy: Patient Spontanous Breathing and Patient connected to nasal cannula oxygen  Post-op Assessment: Report given to RN and Post -op Vital signs reviewed and stable  Post vital signs: Reviewed and stable  Last Vitals:  Vitals Value Taken Time  BP 138/110 12/24/21 0742  Temp    Pulse 44 12/24/21 0746  Resp 12 12/24/21 0746  SpO2 97 % 12/24/21 0746  Vitals shown include unvalidated device data.  Last Pain:  Vitals:   12/24/21 0659  TempSrc: Oral  PainSc: 0-No pain         Complications: No notable events documented.

## 2021-12-25 ENCOUNTER — Encounter: Payer: Self-pay | Admitting: Cardiovascular Disease

## 2021-12-25 NOTE — Anesthesia Postprocedure Evaluation (Signed)
Anesthesia Post Note  Patient: Zachary Rogers  Procedure(s) Performed: CARDIOVERSION  Patient location during evaluation: [facility] Recovery Anesthesia Type: General Level of consciousness: awake and alert Pain management: pain level controlled Vital Signs Assessment: post-procedure vital signs reviewed and stable Respiratory status: spontaneous breathing, nonlabored ventilation, respiratory function stable and patient connected to nasal cannula oxygen Cardiovascular status: blood pressure returned to baseline and stable Postop Assessment: no apparent nausea or vomiting Anesthetic complications: no   No notable events documented.   Last Vitals:  Vitals:   12/24/21 0815 12/24/21 0830  BP: 105/69 111/80  Pulse: (!) 44 (!) 38  Resp: 15 17  Temp:    SpO2: 95% 97%    Last Pain:  Vitals:   12/24/21 0830  TempSrc:   PainSc: 0-No pain                 Precious Haws [name]

## 2021-12-26 ENCOUNTER — Ambulatory Visit (INDEPENDENT_AMBULATORY_CARE_PROVIDER_SITE_OTHER): Payer: Medicare Other | Admitting: Family Medicine

## 2021-12-26 ENCOUNTER — Encounter: Payer: Self-pay | Admitting: Family Medicine

## 2021-12-26 VITALS — BP 108/67 | HR 51 | Temp 97.7°F | Wt 275.0 lb

## 2021-12-26 DIAGNOSIS — F5101 Primary insomnia: Secondary | ICD-10-CM | POA: Diagnosis not present

## 2021-12-26 DIAGNOSIS — I10 Essential (primary) hypertension: Secondary | ICD-10-CM

## 2021-12-26 DIAGNOSIS — I2510 Atherosclerotic heart disease of native coronary artery without angina pectoris: Secondary | ICD-10-CM

## 2021-12-26 DIAGNOSIS — I700 Atherosclerosis of aorta: Secondary | ICD-10-CM | POA: Diagnosis not present

## 2021-12-26 DIAGNOSIS — I4819 Other persistent atrial fibrillation: Secondary | ICD-10-CM | POA: Diagnosis not present

## 2021-12-26 DIAGNOSIS — E785 Hyperlipidemia, unspecified: Secondary | ICD-10-CM

## 2021-12-26 LAB — MICROALBUMIN, URINE WAIVED
Creatinine, Urine Waived: 300 mg/dL (ref 10–300)
Microalb, Ur Waived: 30 mg/L — ABNORMAL HIGH (ref 0–19)
Microalb/Creat Ratio: <30 mg/g (ref <30)

## 2021-12-26 LAB — URINALYSIS, ROUTINE W REFLEX MICROSCOPIC
Bilirubin, UA: NEGATIVE
Glucose, UA: NEGATIVE
Leukocytes,UA: NEGATIVE
Nitrite, UA: NEGATIVE
Protein,UA: NEGATIVE
RBC, UA: NEGATIVE
Specific Gravity, UA: 1.025 (ref 1.005–1.030)
Urobilinogen, Ur: 0.2 mg/dL (ref 0.2–1.0)
pH, UA: 5.5 (ref 5.0–7.5)

## 2021-12-26 MED ORDER — AMLODIPINE BESYLATE 5 MG PO TABS: 5.0000 mg | ORAL_TABLET | Freq: Every day | ORAL | 1 refills | Status: DC

## 2021-12-26 MED ORDER — ATORVASTATIN CALCIUM 80 MG PO TABS: 80.0000 mg | ORAL_TABLET | Freq: Every day | ORAL | 1 refills | Status: DC

## 2021-12-26 MED ORDER — APIXABAN 5 MG PO TABS: ORAL_TABLET | ORAL | 1 refills | Status: DC

## 2021-12-26 MED ORDER — METOPROLOL TARTRATE 25 MG PO TABS
25.0000 mg | ORAL_TABLET | Freq: Two times a day (BID) | ORAL | 1 refills | Status: DC
Start: 2021-12-26 — End: 2022-01-29

## 2021-12-26 MED ORDER — TRAZODONE HCL 50 MG PO TABS: 25.0000 mg | ORAL_TABLET | Freq: Every evening | ORAL | 3 refills | Status: DC | PRN

## 2021-12-26 NOTE — Assessment & Plan Note (Signed)
Under good control on current regimen. Continue current regimen. Continue to monitor. Call with any concerns. Refills given. Labs dawn today.   

## 2021-12-26 NOTE — Assessment & Plan Note (Signed)
Under good control on current regimen. Continue current regimen. Continue to monitor. Call with any concerns. Refills given. Labs drawn today.   

## 2021-12-26 NOTE — Assessment & Plan Note (Signed)
Will keep BP and cholesterol under good control. Call with any concerns.  ?

## 2021-12-26 NOTE — Assessment & Plan Note (Signed)
Normal rhythm today. Continue to monitor. Call with any concerns.

## 2021-12-26 NOTE — Progress Notes (Signed)
BP 108/67   Pulse (!) 51   Temp 97.7 F (36.5 C)   Wt 275 lb (124.7 kg)   SpO2 97%   BMI 34.37 kg/m    Subjective:    Patient ID: Zachary Rogers, male    DOB: 1950/08/06, 71 y.o.   MRN: [id number]  HPI: Zachary Rogers is a 71 y.o. male  Chief Complaint  Patient presents with   Hypertension   Hyperlipidemia   Persistent atrial fibrillation    Insomnia    Patient states he is not able to sleep through the night, sometimes he wakes up in the middle of the night and can't go back to sleep   HYPERTENSION / [place] Satisfied with current treatment? yes Duration of hypertension: chronic BP monitoring frequency: not checking BP medication side effects: no Past BP meds: amlodipine, metoprolol Duration of hyperlipidemia: chronic Cholesterol medication side effects: no Cholesterol supplements: fish oil Past cholesterol medications: atorvastatin Medication compliance: excellent compliance Aspirin: no Recent stressors: no Recurrent headaches: no Visual changes: no Palpitations: no Dyspnea: no Chest pain: no Lower extremity edema: no Dizzy/lightheaded: no  INSOMNIA Duration:  couple of weeks Satisfied with sleep quality: yes Difficulty falling asleep: no Difficulty staying asleep: yes Waking a few hours after sleep onset: yes Early morning awakenings: yes Daytime hypersomnolence: no Wakes feeling refreshed: no Good sleep hygiene: no Apnea: no Snoring: no Depressed/anxious mood: no Recent stress: no Restless legs/nocturnal leg cramps: no Chronic pain/arthritis: no History of sleep study: no Treatments attempted: none    Relevant past medical, surgical, family and social history reviewed and updated as indicated. Interim medical history since our last visit reviewed. Allergies and medications reviewed and updated.  Review of Systems  Constitutional: Negative.   Respiratory: Negative.    Cardiovascular: Negative.   Gastrointestinal: Negative.    Musculoskeletal: Negative.   Neurological: Negative.   Psychiatric/Behavioral: Negative.      Per HPI unless specifically indicated above     Objective:    BP 108/67   Pulse (!) 51   Temp 97.7 F (36.5 C)   Wt 275 lb (124.7 kg)   SpO2 97%   BMI 34.37 kg/m   Wt Readings from Last 3 Encounters:  12/26/21 275 lb (124.7 kg)  12/24/21 270 lb (122.5 kg)  12/19/21 269 lb (122 kg)    Physical Exam Vitals and nursing note reviewed.  Constitutional:      General: He is not in acute distress.    Appearance: Normal appearance. He is normal weight. He is not ill-appearing, toxic-appearing or diaphoretic.  HENT:     Head: Normocephalic and atraumatic.     Right Ear: External ear normal.     Left Ear: External ear normal.     Nose: Nose normal.     Mouth/Throat:     Mouth: Mucous membranes are moist.     Pharynx: Oropharynx is clear.  Eyes:     General: No scleral icterus.       Right eye: No discharge.        Left eye: No discharge.     Extraocular Movements: Extraocular movements intact.     Conjunctiva/sclera: Conjunctivae normal.     Pupils: Pupils are equal, round, and reactive to light.  Cardiovascular:     Rate and Rhythm: Normal rate and regular rhythm.     Pulses: Normal pulses.     Heart sounds: Normal heart sounds. No murmur heard.    No friction rub. No gallop.  Pulmonary:     Effort: Pulmonary effort is normal. No respiratory distress.     Breath sounds: Normal breath sounds. No stridor. No wheezing, rhonchi or rales.  Chest:     Chest wall: No tenderness.  Musculoskeletal:        General: Normal range of motion.     Cervical back: Normal range of motion and neck supple.  Skin:    General: Skin is warm and dry.     Capillary Refill: Capillary refill takes less than 2 seconds.     Coloration: Skin is not jaundiced or pale.     Findings: No bruising, erythema, lesion or rash.  Neurological:     General: No focal deficit present.     Mental Status: He is  alert and oriented to person, place, and time. Mental status is at baseline.  Psychiatric:        Mood and Affect: Mood normal.        Behavior: Behavior normal.        Thought Content: Thought content normal.        Judgment: Judgment normal.     Results for orders placed or performed during the hospital encounter of 12/12/21  TSH  Result Value Ref Range   TSH 1.442 0.350 - 4.500 uIU/mL  CBC  Result Value Ref Range   WBC 8.3 4.0 - 10.5 K/uL   RBC 5.00 4.22 - 5.81 MIL/uL   Hemoglobin 15.1 13.0 - 17.0 g/dL   HCT 43.2 39.0 - 52.0 %   MCV 86.4 80.0 - 100.0 fL   MCH 30.2 26.0 - 34.0 pg   MCHC 35.0 30.0 - 36.0 g/dL   RDW 13.9 11.5 - 15.5 %   Platelets 179 150 - 400 K/uL   nRBC 0.0 0.0 - 0.2 %  Magnesium  Result Value Ref Range   Magnesium 2.1 1.7 - 2.4 mg/dL  Basic metabolic panel  Result Value Ref Range   Sodium 141 135 - 145 mmol/L   Potassium 3.6 3.5 - 5.1 mmol/L   Chloride 113 (H) 98 - 111 mmol/L   CO2 21 (L) 22 - 32 mmol/L   Glucose, Bld 106 (H) 70 - 99 mg/dL   BUN 20 8 - 23 mg/dL   Creatinine, Ser 1.38 (H) 0.61 - 1.24 mg/dL   Calcium 9.3 8.9 - 10.3 mg/dL   GFR, Estimated 54 (L) >60 mL/min   Anion gap 7 5 - 15      Assessment & Plan:   Problem List Items Addressed This Visit       Cardiovascular and Mediastinum   Essential hypertension    Under good control on current regimen. Continue current regimen. Continue to monitor. Call with any concerns. Refills given. Labs drawn today.        Relevant Medications   apixaban (ELIQUIS) 5 MG TABS tablet   amLODipine (NORVASC) 5 MG tablet   atorvastatin (LIPITOR) 80 MG tablet   metoprolol tartrate (LOPRESSOR) 25 MG tablet   Other Relevant Orders   Comprehensive metabolic panel   Microalbumin, Urine Waived   Urinalysis, Routine w reflex microscopic   Aortic atherosclerosis (HCC)    Will keep BP and cholesterol under good control. Call with any concerns.       Relevant Medications   apixaban (ELIQUIS) 5 MG TABS  tablet   amLODipine (NORVASC) 5 MG tablet   atorvastatin (LIPITOR) 80 MG tablet   metoprolol tartrate (LOPRESSOR) 25 MG tablet   Other Relevant Orders   Comprehensive  metabolic panel   Lipid Panel w/o Chol/HDL Ratio   Persistent atrial fibrillation (HCC) - Primary    Normal rhythm today. Continue to monitor. Call with any concerns.       Relevant Medications   apixaban (ELIQUIS) 5 MG TABS tablet   amLODipine (NORVASC) 5 MG tablet   atorvastatin (LIPITOR) 80 MG tablet   metoprolol tartrate (LOPRESSOR) 25 MG tablet   Other Relevant Orders   Comprehensive metabolic panel     Other   Hyperlipidemia LDL goal <70    Under good control on current regimen. Continue current regimen. Continue to monitor. Call with any concerns. Refills given. Labs dawn today.       Relevant Medications   apixaban (ELIQUIS) 5 MG TABS tablet   amLODipine (NORVASC) 5 MG tablet   atorvastatin (LIPITOR) 80 MG tablet   metoprolol tartrate (LOPRESSOR) 25 MG tablet   Other Relevant Orders   Comprehensive metabolic panel   Lipid Panel w/o Chol/HDL Ratio   Other Visit Diagnoses     Primary insomnia       Will start PRN trazodone. Call if not getting better or getting worse.         Follow up plan: Return in about 6 months (around 06/28/2022).

## 2021-12-27 LAB — COMPREHENSIVE METABOLIC PANEL
ALT: 74 IU/L — ABNORMAL HIGH (ref 0–44)
AST: 45 IU/L — ABNORMAL HIGH (ref 0–40)
Albumin/Globulin Ratio: 1.8 (ref 1.2–2.2)
Albumin: 4.4 g/dL (ref 3.8–4.8)
Alkaline Phosphatase: 121 IU/L (ref 44–121)
BUN/Creatinine Ratio: 11 (ref 10–24)
BUN: 15 mg/dL (ref 8–27)
Bilirubin Total: 2.4 mg/dL — ABNORMAL HIGH (ref 0.0–1.2)
CO2: 22 mmol/L (ref 20–29)
Calcium: 9.5 mg/dL (ref 8.6–10.2)
Chloride: 106 mmol/L (ref 96–106)
Creatinine, Ser: 1.33 mg/dL — ABNORMAL HIGH (ref 0.76–1.27)
Globulin, Total: 2.4 g/dL (ref 1.5–4.5)
Glucose: 95 mg/dL (ref 70–99)
Potassium: 3.8 mmol/L (ref 3.5–5.2)
Sodium: 142 mmol/L (ref 134–144)
Total Protein: 6.8 g/dL (ref 6.0–8.5)
eGFR: 56 mL/min/{1.73_m2} — ABNORMAL LOW (ref >59)

## 2021-12-27 LAB — LIPID PANEL W/O CHOL/HDL RATIO
Cholesterol, Total: 122 mg/dL (ref 100–199)
HDL: 73 mg/dL (ref >39)
LDL Chol Calc (NIH): 36 mg/dL (ref 0–99)
Triglycerides: 63 mg/dL (ref 0–149)
VLDL Cholesterol Cal: 13 mg/dL (ref 5–40)

## 2022-01-02 ENCOUNTER — Other Ambulatory Visit (HOSPITAL_COMMUNITY): Payer: Self-pay

## 2022-01-28 NOTE — Progress Notes (Unsigned)
Cardiology Office Note:    Date:  01/29/2022   ID:  Zachary Rogers, DOB 02/22/1951, MRN [id number]  PCP:  Valerie Roys, DO  [facility] Cardiologist:  Nelva Bush, MD  [facility] Electrophysiologist:  None   Referring MD: Valerie Roys, DO   Chief Complaint: F/u DCCV  History of Present Illness:    Zachary Rogers is a 71 y.o. male with a hx of stroke with subsequent discovery of large PFO s/p PFO closure, Afib s/p DCCV, HTN, HLD, nephrolithiasis.    Seen in November 2023 at which time he was still in Afib. He underwent cardioversion in December.    seen 05/31/21 and was in NSR. It was suspected afib was related to PFO closure. Eliquis was continued. Metoprolol was previously discontinued.   Seen 12/12/21 and was in afib RVR with HR 113bpm. Labs were ordered and Lopressor was restarted. In follow up 12/19/21 he was still in afib RVR. He was set up repeat cardioversion.   Today, the patient is in sinus bradycardia with a heart rate of 40bpm. He is on Lopressor $RemoveBefo'25mg'[id number]$  BID. He denies any dizziness, lightheadedness, weakness, fatigue. No chest pain or SOB. No lower leg edema, orthopnea or pnd. No bleeding issues on Eliquis.   Past Medical History:  Diagnosis Date   Actinic keratosis    R lat neck - bx proven with verruca vulgaris   Cerebrovascular accident (CVA) ([place]) 10/25/2020   Dysplastic nevus 06/22/2017   L mid back parapinal -severe, excision 10/06/2017   Hypertension    Lateral epicondylitis (tennis elbow) 09/04/2020   Mixed hyperlipidemia 07/18/2019   Nephrolithiasis     Past Surgical History:  Procedure Laterality Date   BUBBLE STUDY  12/17/2020   Procedure: BUBBLE STUDY;  Surgeon: Fay Records, MD;  Location: [place];  Service: Cardiovascular;;   CARDIOVERSION N/A 04/30/2021   Procedure: CARDIOVERSION;  Surgeon: Nelva Bush, MD;  Location: [place] [facility];  Service: Cardiovascular;  Laterality: N/A;   CARDIOVERSION N/A 12/24/2021   Procedure:  CARDIOVERSION;  Surgeon: Wellington Hampshire, MD;  Location: [facility];  Service: Cardiovascular;  Laterality: N/A;   CHOLECYSTECTOMY  06/07/1998   COLONOSCOPY     COLONOSCOPY WITH PROPOFOL N/A 11/09/2018   Procedure: COLONOSCOPY WITH PROPOFOL;  Surgeon: [name], MD;  Location: [facility] ENDOSCOPY;  Service: Gastroenterology;  Laterality: N/A;   PATENT FORAMEN OVALE(PFO) CLOSURE N/A 02/13/2021   Procedure: PATENT FORAMEN OVALE (PFO) CLOSURE;  Surgeon: Sherren Mocha, MD;  Location: [place] [facility];  Service: Cardiovascular;  Laterality: N/A;   SKIN LESION EXCISION     dermatlogy - precancerous lesion 1 year ago on back    TEE WITHOUT CARDIOVERSION N/A 12/17/2020   Procedure: TRANSESOPHAGEAL ECHOCARDIOGRAM (TEE);  Surgeon: Fay Records, MD;  Location: [facility] ENDOSCOPY;  Service: Cardiovascular;  Laterality: N/A;    Current Medications: Current Meds  Medication Sig   amLODipine (NORVASC) 5 MG tablet Take 1 tablet (5 mg total) by mouth daily.   apixaban (ELIQUIS) 5 MG TABS tablet TAKE 1 TABLET(5 MG) BY MOUTH TWICE DAILY   atorvastatin (LIPITOR) 80 MG tablet Take 1 tablet (80 mg total) by mouth daily.   Multiple Vitamin (MULTIVITAMIN) capsule Take 1 capsule by mouth daily.   Omega-3 Fatty Acids (FISH OIL) 1000 MG CAPS Take 1,000 mg by mouth daily.   traZODone (DESYREL) 50 MG tablet Take 0.5-1 tablets (25-50 mg total) by mouth at bedtime as needed for sleep.   [DISCONTINUED] metoprolol tartrate (LOPRESSOR) 25  MG tablet Take 1 tablet (25 mg total) by mouth 2 (two) times daily.     Allergies:   Patient has no known allergies.   Social History   Socioeconomic History   Marital status: Married    Spouse name: Not on file   Number of children: Not on file   Years of education: Not on file   Highest education level: Master's degree (e.g., MA, MS, MEng, MEd, MSW, MBA)  Occupational History   Occupation: working fulltime   Tobacco Use   Smoking status: Former    Packs/day: 0.00    Years: 0.00     Total pack years: 0.00    Types: Cigarettes    Quit date: 2000    Years since quitting: 23.7   Smokeless tobacco: Never   Tobacco comments:    social smoker when younger   Vaping Use   Vaping Use: Never used  Substance and Sexual Activity   Alcohol use: Yes    Alcohol/week: 5.0 standard drinks of alcohol    Types: 5 Glasses of wine per week   Drug use: No   Sexual activity: Yes    Birth control/protection: Surgical  Other Topics Concern   Not on file  Social History Theatre stage manager, local friends.    Social Determinants of Health   Financial Resource Strain: Low Risk  (07/25/2021)   Overall Financial Resource Strain (CARDIA)    Difficulty of Paying Living Expenses: Not hard at all  Food Insecurity: No Food Insecurity (07/25/2021)   Hunger Vital Sign    Worried About Running Out of Food in the Last Year: Never true    Ran Out of Food in the Last Year: Never true  Transportation Needs: No Transportation Needs (07/25/2021)   PRAPARE - Hydrologist (Medical): No    Lack of Transportation (Non-Medical): No  Physical Activity: Sufficiently Active (07/25/2021)   Exercise Vital Sign    Days of Exercise per Week: 5 days    Minutes of Exercise per Session: 60 min  Stress: No Stress Concern Present (07/25/2021)   [place]    Feeling of Stress : Not at all  Social Connections: Moderately Integrated (07/25/2021)   Social Connection and Isolation Panel [NHANES]    Frequency of Communication with Friends and Family: More than three times a week    Frequency of Social Gatherings with Friends and Family: Three times a week    Attends Religious Services: Never    Active Member of Clubs or Organizations: Yes    Attends Music therapist: More than 4 times per year    Marital Status: Married     Family History: The patient's family history includes Diabetes in his father;  Heart attack in his maternal grandfather; Hypertension in his father; Lung cancer in his mother; Transient ischemic attack in his father. There is no history of Prostate cancer, Bladder Cancer, or Kidney cancer.  ROS:   Please see the history of present illness.     All other systems reviewed and are negative.  EKGs/Labs/Other Studies Reviewed:    The following studies were reviewed today:  Echo 02/13/21    1. Aneurysmal with PFO on prior TEE. Post closure with 18 mm Aplatzer  device On 4 chamber and PSSA images no residual PFO Subcostal images poor.   2. Right atrial size was mildly dilated.   3. The mitral valve is abnormal.  4. The aortic valve is tricuspid. Aortic valve regurgitation is mild.  Mild to moderate aortic valve sclerosis/calcification is present, without  any evidence of aortic stenosis.    Cardiac Cath 01/2021 Successful transcatheter PFO closure under intracardiac echo and fluoroscopic guidance using an 18 mm Amplatzer atrial septal occluder device.  Recommendations: Limited 2D echocardiogram prior to discharge Dual antiplatelet therapy for 6 months with aspirin and clopidogrel SBE prophylaxis x6 months when indicated Same-day discharge if criteria met   Echo TEE 12/2020   1. Left ventricular ejection fraction, by estimation, is 60 to 65%. The  left ventricle has normal function.   2. Right ventricular systolic function is normal. The right ventricular  size is normal.   3. No left atrial/left atrial appendage thrombus was detected.   4. The interatrial septum is aneurysmal with bowing both into L and R  atrium There is a large PFO that is easily seen with color doppler with L  to R flow. Alos shown when tested with injection of agitated saline with  appearance of bubbles seen in left  sided chambers .   5. The mitral valve is normal in structure. Mild mitral valve  regurgitation.   6. Aortic valve regurgitation is trivial.   7. Minimal fixed plaquing in  the thoracic aorta.  EKG:  EKG is ordered today.  The ekg ordered today demonstrates sinus bradycardia, 40bpm, TWI aVF, PAC  Recent Labs: 12/12/2021: Hemoglobin 15.1; Magnesium 2.1; Platelets 179; TSH 1.442 12/26/2021: ALT 74; BUN 15; Creatinine, Ser 1.33; Potassium 3.8; Sodium 142  Recent Lipid Panel    Component Value Date/Time   CHOL 122 12/26/2021 0857   TRIG 63 12/26/2021 0857   HDL 73 12/26/2021 0857   LDLCALC 36 12/26/2021 0857     Physical Exam:    VS:  BP 120/60 (BP Location: Left Arm, Patient Position: Sitting, Cuff Size: Large)   Pulse (!) 40   Ht _0  (1.905 m)   Wt 269 lb 6 oz (122.2 kg)   SpO2 98%   BMI 33.67 kg/m     Wt Readings from Last 3 Encounters:  01/29/22 269 lb 6 oz (122.2 kg)  12/26/21 275 lb (124.7 kg)  12/24/21 270 lb (122.5 kg)     GEN:  Well nourished, well developed in no acute distress HEENT: Normal NECK: No JVD; No carotid bruits LYMPHATICS: No lymphadenopathy CARDIAC: bradycardia, RR, no murmurs, rubs, gallops RESPIRATORY:  Clear to auscultation without rales, wheezing or rhonchi  ABDOMEN: Soft, non-tender, non-distended MUSCULOSKELETAL:  No edema; No deformity  SKIN: Warm and dry NEUROLOGIC:  Alert and oriented x 3 PSYCHIATRIC:  Normal affect   ASSESSMENT:    1. Persistent atrial fibrillation ([place])   2. Hyperlipidemia LDL goal <70   3. PFO (patent foramen ovale)   4. History of stroke   5. Essential hypertension    PLAN:    In order of problems listed above:  Persistent Afib He is s/p second cardioversion and is in sinus bradycardia with a heart rate of 40bpm. He denies any symptoms. I will decrease Lopressor to 12.26m BID. He will continue to monitor his heart rate at home. Unsure he will tolerate BB at baseline. Continue Eliquis 558mBID for stroke ppx.   H/o stroke and PFO closure No new neurological defects. Most recent LDL was 32. Continue statin therapy.   HTN BP is good today, continue Lopressor and amlodipine 45m38mdaily.   Disposition: Follow up in 3 month(s) with MD  Signed, Jaziyah Gradel Ninfa Meeker, PA-C  01/29/2022 9:40 AM    [facility]

## 2022-01-29 ENCOUNTER — Encounter: Payer: Self-pay | Admitting: Medical

## 2022-01-29 ENCOUNTER — Ambulatory Visit: Payer: Medicare Other | Attending: Medical | Admitting: Medical

## 2022-01-29 VITALS — BP 120/60 | HR 40 | Ht 75.0 in | Wt 269.4 lb

## 2022-01-29 DIAGNOSIS — E785 Hyperlipidemia, unspecified: Secondary | ICD-10-CM | POA: Diagnosis not present

## 2022-01-29 DIAGNOSIS — Q2112 Patent foramen ovale: Secondary | ICD-10-CM | POA: Diagnosis not present

## 2022-01-29 DIAGNOSIS — I10 Essential (primary) hypertension: Secondary | ICD-10-CM | POA: Diagnosis not present

## 2022-01-29 DIAGNOSIS — I4819 Other persistent atrial fibrillation: Secondary | ICD-10-CM | POA: Diagnosis not present

## 2022-01-29 DIAGNOSIS — Z8673 Personal history of transient ischemic attack (TIA), and cerebral infarction without residual deficits: Secondary | ICD-10-CM | POA: Diagnosis not present

## 2022-01-29 MED ORDER — METOPROLOL TARTRATE 25 MG PO TABS: ORAL_TABLET | ORAL | 1 refills | Status: DC

## 2022-01-29 NOTE — Patient Instructions (Signed)
Medication Instructions:  - Your physician has recommended you make the following change in your medication:   1) DECREASE Metoprolol tartrate 25 mg: - take 0.5 tablet (12.5 mg) by mouth twice daily   *If you need a refill on your cardiac medications before your next appointment, please call your pharmacy*   Lab Work: - none ordered  If you have labs (blood work) drawn today and your tests are completely normal, you will receive your results only by: [place] (if you have MyChart) OR A paper copy in the mail If you have any lab test that is abnormal or we need to change your treatment, we will call you to review the results.   Testing/Procedures: - none ordered   Follow-Up: At [facility], you and your health needs are our priority.  As part of our continuing mission to provide you with exceptional heart care, we have created designated Provider Care Teams.  These Care Teams include your primary Cardiologist (physician) and Advanced Practice Providers (APPs -  Physician Assistants and Nurse Practitioners) who all work together to provide you with the care you need, when you need it.  We recommend signing up for the patient portal called "MyChart".  Sign up information is provided on this After Visit Summary.  MyChart is used to connect with patients for Virtual Visits (Telemedicine).  Patients are able to view lab/test results, encounter notes, upcoming appointments, etc.  Non-urgent messages can be sent to your provider as well.   To learn more about what you can do with MyChart, go to [email or web address].    Your next appointment:   3 month(s)  The format for your next appointment:   In Person  Provider:   You may see Nelva Bush, MD or one of the following Advanced Practice Providers on your designated Care Team:    Cadence Kathlen Mody, [place]   Other Instructions N/a  Important Information About Sugar

## 2022-02-18 DIAGNOSIS — M5432 Sciatica, left side: Secondary | ICD-10-CM | POA: Diagnosis not present

## 2022-02-18 DIAGNOSIS — Z8673 Personal history of transient ischemic attack (TIA), and cerebral infarction without residual deficits: Secondary | ICD-10-CM | POA: Diagnosis not present

## 2022-02-18 DIAGNOSIS — Z8774 Personal history of (corrected) congenital malformations of heart and circulatory system: Secondary | ICD-10-CM | POA: Diagnosis not present

## 2022-03-03 ENCOUNTER — Other Ambulatory Visit: Payer: Self-pay | Admitting: Family Medicine

## 2022-03-04 NOTE — Telephone Encounter (Signed)
Requested Prescriptions  Pending Prescriptions Disp Refills  . amLODipine (NORVASC) 5 MG tablet [Pharmacy Med Name: AMLODIPINE BESYLATE TABS '5MG'$ ] 90 tablet 3    Sig: TAKE 1 TABLET DAILY     Cardiovascular: Calcium Channel Blockers 2 Failed - 03/03/2022  3:42 AM      Failed - Last Heart Rate in normal range    Pulse Readings from Last 1 Encounters:  01/29/22 (!) 40         Passed - Last BP in normal range    BP Readings from Last 1 Encounters:  01/29/22 120/60         Passed - Valid encounter within last 6 months    Recent Outpatient Visits          2 months ago Persistent atrial fibrillation [facility])   [place], [place], DO   8 months ago Essential hypertension   Ava, [place], DO   11 months ago Essential hypertension   [place], [place], DO   1 year ago PFO (patent foramen ovale)   [employer], [place], DO   1 year ago Mixed hyperlipidemia   [facility] [place], Barb Merino, DO      Future Appointments            In 3 weeks Ralene Bathe, MD [place]   In 1 month End, Harrell Gave, MD [place]. [place]   In 3 months Wynetta Emery, Barb Merino, DO [facility], [facility]   In 6 months McGowan, Shannon A, [place]

## 2022-03-18 ENCOUNTER — Other Ambulatory Visit: Payer: Self-pay | Admitting: Internal Medicine

## 2022-03-18 NOTE — Telephone Encounter (Signed)
Prescription refill request for Eliquis received. Indication: AF Last office visit: 01/29/22  C Furth PA-C Scr: 1.33 on 12/26/21 Age: 71 Weight: 122.2kg  Based on above findings Eliquis '5mg'$  twice daily is the appropriate dose.  Refill approved.

## 2022-03-20 ENCOUNTER — Ambulatory Visit: Payer: Medicare Other | Admitting: Dermatology

## 2022-03-24 ENCOUNTER — Other Ambulatory Visit: Payer: Self-pay | Admitting: Family Medicine

## 2022-03-24 NOTE — Telephone Encounter (Signed)
Rx 12/26/21 #90 1RF Requested Prescriptions  Pending Prescriptions Disp Refills   atorvastatin (LIPITOR) 80 MG tablet [Pharmacy Med Name: ATORVASTATIN TABS '80MG'$ ] 90 tablet 3    Sig: TAKE 1 TABLET DAILY     Cardiovascular:  Antilipid - Statins Failed - 03/24/2022  2:50 AM      Failed - Lipid Panel in normal range within the last 12 months    Cholesterol, Total  Date Value Ref Range Status  12/26/2021 122 100 - 199 mg/dL Final   LDL Chol Calc (NIH)  Date Value Ref Range Status  12/26/2021 36 0 - 99 mg/dL Final   HDL  Date Value Ref Range Status  12/26/2021 73 >39 mg/dL Final   Triglycerides  Date Value Ref Range Status  12/26/2021 63 0 - 149 mg/dL Final         Passed - Patient is not pregnant      Passed - Valid encounter within last 12 months    Recent Outpatient Visits           2 months ago Persistent atrial fibrillation ([place])   [place], Megan P, DO   8 months ago Essential hypertension   [place], [place], DO   12 months ago Essential hypertension   Brown, [place], DO   1 year ago PFO (patent foramen ovale)   [employer], [place], DO   1 year ago Mixed hyperlipidemia   [facility] Valerie Roys, DO       Future Appointments             In 2 days Ralene Bathe, MD [place]   In 1 month End, Harrell Gave, [place]. [place]   In 3 months Wynetta Emery, Barb Merino, DO [facility], [facility]   In 6 months McGowan, Shannon A, [place]

## 2022-03-26 ENCOUNTER — Ambulatory Visit (INDEPENDENT_AMBULATORY_CARE_PROVIDER_SITE_OTHER): Payer: Medicare Other | Admitting: Dermatology

## 2022-03-26 DIAGNOSIS — Z86018 Personal history of other benign neoplasm: Secondary | ICD-10-CM | POA: Diagnosis not present

## 2022-03-26 DIAGNOSIS — L578 Other skin changes due to chronic exposure to nonionizing radiation: Secondary | ICD-10-CM | POA: Diagnosis not present

## 2022-03-26 DIAGNOSIS — L821 Other seborrheic keratosis: Secondary | ICD-10-CM | POA: Diagnosis not present

## 2022-03-26 DIAGNOSIS — I2510 Atherosclerotic heart disease of native coronary artery without angina pectoris: Secondary | ICD-10-CM

## 2022-03-26 DIAGNOSIS — L814 Other melanin hyperpigmentation: Secondary | ICD-10-CM

## 2022-03-26 DIAGNOSIS — D229 Melanocytic nevi, unspecified: Secondary | ICD-10-CM | POA: Diagnosis not present

## 2022-03-26 DIAGNOSIS — Z872 Personal history of diseases of the skin and subcutaneous tissue: Secondary | ICD-10-CM

## 2022-03-26 DIAGNOSIS — Z1283 Encounter for screening for malignant neoplasm of skin: Secondary | ICD-10-CM

## 2022-03-26 DIAGNOSIS — L918 Other hypertrophic disorders of the skin: Secondary | ICD-10-CM

## 2022-03-26 NOTE — Progress Notes (Signed)
Follow-Up Visit   Subjective  Zachary Rogers is a 71 y.o. male who presents for the following: Total body skin exam (Hx of Dysplastic nevus L mid back paraspinal, hx of AKs). The patient presents for Total-Body Skin Exam (TBSE) for skin cancer screening and mole check.  The patient has spots, moles and lesions to be evaluated, some may be new or changing and the patient has concerns that these could be cancer.  The following portions of the chart were reviewed this encounter and updated as appropriate:   Tobacco  Allergies  Meds  Problems  Med Hx  Surg Hx  Fam Hx     Review of Systems:  No other skin or systemic complaints except as noted in HPI or Assessment and Plan.  Objective  Well appearing patient in no apparent distress; mood and affect are within normal limits.  A full examination was performed including scalp, head, eyes, ears, nose, lips, neck, chest, axillae, abdomen, back, buttocks, bilateral upper extremities, bilateral lower extremities, hands, feet, fingers, toes, fingernails, and toenails. All findings within normal limits unless otherwise noted below.  L mid back paraspinal Scar with no evidence of recurrence.    Assessment & Plan   Lentigines - Scattered tan macules - Due to sun exposure - Benign-appearing, observe - Recommend daily broad spectrum sunscreen SPF 30+ to sun-exposed areas, reapply every 2 hours as needed. - Call for any changes  Seborrheic Keratoses - Stuck-on, waxy, tan-brown papules and/or plaques  - Benign-appearing - Discussed benign etiology and prognosis. - Observe - Call for any changes  Melanocytic Nevi - Tan-brown and/or pink-flesh-colored symmetric macules and papules - Benign appearing on exam today - Observation - Call clinic for new or changing moles - Recommend daily use of broad spectrum spf 30+ sunscreen to sun-exposed areas.   Hemangiomas - Red papules - Discussed benign nature - Observe - Call for any  changes  Actinic Damage - Chronic condition, secondary to cumulative UV/sun exposure - diffuse scaly erythematous macules with underlying dyspigmentation - Recommend daily broad spectrum sunscreen SPF 30+ to sun-exposed areas, reapply every 2 hours as needed.  - Staying in the shade or wearing long sleeves, sun glasses (UVA+UVB protection) and wide brim hats (4-inch brim around the entire circumference of the hat) are also recommended for sun protection.  - Call for new or changing lesions.  Skin cancer screening performed today.   History of PreCancerous Actinic Keratosis  - site(s) of PreCancerous Actinic Keratosis clear today. - these may recur and new lesions may form requiring treatment to prevent transformation into skin cancer - observe for new or changing spots and contact [place] for appointment if occur - photoprotection with sun protective clothing; sunglasses and broad spectrum sunscreen with SPF of at least 30 + and frequent self skin exams recommended - yearly exams by a dermatologist recommended for persons with history of PreCancerous Actinic Keratoses   Acrochordons (Skin Tags) - Fleshy, skin-colored pedunculated papules - Benign appearing.  - Observe. - If desired, they can be removed with an in office procedure that is not covered by insurance. - Please call the clinic if you notice any new or changing lesions.   History of dysplastic nevus L mid back paraspinal  Clear. Observe for recurrence. Call clinic for new or changing lesions.  Recommend regular skin exams, daily broad-spectrum spf 30+ sunscreen use, and photoprotection.     Return in about 1 year (around 03/27/2023) for TBSE, Hx of Dysplastic nevi,  Hx of AKs.  I, Othelia Pulling, RMA, am acting as scribe for Sarina Ser, MD . Documentation: I have reviewed the above documentation for accuracy and completeness, and I agree with the above.  Sarina Ser, MD

## 2022-03-26 NOTE — Patient Instructions (Signed)
Due to recent changes in healthcare laws, you may see results of your pathology and/or laboratory studies on MyChart before the doctors have had a chance to review them. We understand that in some cases there may be results that are confusing or concerning to you. Please understand that not all results are received at the same time and often the doctors may need to interpret multiple results in order to provide you with the best plan of care or course of treatment. Therefore, we ask that you please give us 2 business days to thoroughly review all your results before contacting the office for clarification. Should we see a critical lab result, you will be contacted sooner.   If You Need Anything After Your Visit  If you have any questions or concerns for your doctor, please call our main line at [phone] and press option 4 to reach your doctor's medical assistant. If no one answers, please leave a voicemail as directed and we will return your call as soon as possible. Messages left after 4 pm will be answered the following business day.   You may also send us a message via MyChart. We typically respond to MyChart messages within 1-2 business days.  For prescription refills, please ask your pharmacy to contact our office. Our fax number is [phone].  If you have an urgent issue when the clinic is closed that cannot wait until the next business day, you can page your doctor at the number below.    Please note that while we do our best to be available for urgent issues outside of office hours, we are not available 24/7.   If you have an urgent issue and are unable to reach us, you may choose to seek medical care at your doctor's office, retail clinic, urgent care center, or emergency room.  If you have a medical emergency, please immediately call 911 or go to the emergency department.  Pager Numbers  - Dr. Kowalski: [phone]  - Dr. Moye: [phone]  - Dr. Stewart:  [phone]  In the event of inclement weather, please call our main line at [phone] for an update on the status of any delays or closures.  Dermatology Medication Tips: Please keep the boxes that topical medications come in in order to help keep track of the instructions about where and how to use these. Pharmacies typically print the medication instructions only on the boxes and not directly on the medication tubes.   If your medication is too expensive, please contact our office at [phone] option 4 or send us a message through MyChart.   We are unable to tell what your co-pay for medications will be in advance as this is different depending on your insurance coverage. However, we may be able to find a substitute medication at lower cost or fill out paperwork to get insurance to cover a needed medication.   If a prior authorization is required to get your medication covered by your insurance company, please allow us 1-2 business days to complete this process.  Drug prices often vary depending on where the prescription is filled and some pharmacies may offer cheaper prices.  The website www.goodrx.com contains coupons for medications through different pharmacies. The prices here do not account for what the cost may be with help from insurance (it may be cheaper with your insurance), but the website can give you the price if you did not use any insurance.  - You can print the associated coupon and take it with   your prescription to the pharmacy.  - You may also stop by our office during regular business hours and pick up a GoodRx coupon card.  - If you need your prescription sent electronically to a different pharmacy, notify our office through [place] MyChart or by phone at [phone] option 4.     Si Usted Necesita Algo Despus de Su Visita  Tambin puede enviarnos un mensaje a travs de MyChart. Por lo general respondemos a los mensajes de MyChart en el transcurso de 1 a 2  das hbiles.  Para renovar recetas, por favor pida a su farmacia que se ponga en contacto con nuestra oficina. Nuestro nmero de fax es el [phone].  Si tiene un asunto urgente cuando la clnica est cerrada y que no puede esperar hasta el siguiente da hbil, puede llamar/localizar a su doctor(a) al nmero que aparece a continuacin.   Por favor, tenga en cuenta que aunque hacemos todo lo posible para estar disponibles para asuntos urgentes fuera del horario de oficina, no estamos disponibles las 24 horas del da, los 7 das de la semana.   Si tiene un problema urgente y no puede comunicarse con nosotros, puede optar por buscar atencin mdica  en el consultorio de su doctor(a), en una clnica privada, en un centro de atencin urgente o en una sala de emergencias.  Si tiene una emergencia mdica, por favor llame inmediatamente al 911 o vaya a la sala de emergencias.  Nmeros de bper  - Dr. Kowalski: [phone]  - Dra. Moye: [phone]  - Dra. Stewart: [phone]  En caso de inclemencias del tiempo, por favor llame a nuestra lnea principal al [phone] para una actualizacin sobre el estado de cualquier retraso o cierre.  Consejos para la medicacin en dermatologa: Por favor, guarde las cajas en las que vienen los medicamentos de uso tpico para ayudarle a seguir las instrucciones sobre dnde y cmo usarlos. Las farmacias generalmente imprimen las instrucciones del medicamento slo en las cajas y no directamente en los tubos del medicamento.   Si su medicamento es muy caro, por favor, pngase en contacto con nuestra oficina llamando al [phone] y presione la opcin 4 o envenos un mensaje a travs de MyChart.   No podemos decirle cul ser su copago por los medicamentos por adelantado ya que esto es diferente dependiendo de la cobertura de su seguro. Sin embargo, es posible que podamos encontrar un medicamento sustituto a menor costo o llenar un formulario para que el  seguro cubra el medicamento que se considera necesario.   Si se requiere una autorizacin previa para que su compaa de seguros cubra su medicamento, por favor permtanos de 1 a 2 das hbiles para completar este proceso.  Los precios de los medicamentos varan con frecuencia dependiendo del lugar de dnde se surte la receta y alguna farmacias pueden ofrecer precios ms baratos.  El sitio web www.goodrx.com tiene cupones para medicamentos de diferentes farmacias. Los precios aqu no tienen en cuenta lo que podra costar con la ayuda del seguro (puede ser ms barato con su seguro), pero el sitio web puede darle el precio si no utiliz ningn seguro.  - Puede imprimir el cupn correspondiente y llevarlo con su receta a la farmacia.  - Tambin puede pasar por nuestra oficina durante el horario de atencin regular y recoger una tarjeta de cupones de GoodRx.  - Si necesita que su receta se enve electrnicamente a una farmacia diferente, informe a nuestra oficina a travs de MyChart de [place]   o por telfono llamando al [phone] y presione la opcin 4.  

## 2022-04-05 ENCOUNTER — Encounter: Payer: Self-pay | Admitting: Dermatology

## 2022-05-01 ENCOUNTER — Encounter: Payer: Self-pay | Admitting: Internal Medicine

## 2022-05-01 ENCOUNTER — Ambulatory Visit: Payer: Medicare Other | Attending: Internal Medicine | Admitting: Internal Medicine

## 2022-05-01 VITALS — BP 120/78 | HR 64 | Ht 75.0 in | Wt 272.0 lb

## 2022-05-01 DIAGNOSIS — I2510 Atherosclerotic heart disease of native coronary artery without angina pectoris: Secondary | ICD-10-CM | POA: Diagnosis not present

## 2022-05-01 DIAGNOSIS — I10 Essential (primary) hypertension: Secondary | ICD-10-CM | POA: Diagnosis not present

## 2022-05-01 DIAGNOSIS — Z8673 Personal history of transient ischemic attack (TIA), and cerebral infarction without residual deficits: Secondary | ICD-10-CM | POA: Insufficient documentation

## 2022-05-01 DIAGNOSIS — E785 Hyperlipidemia, unspecified: Secondary | ICD-10-CM | POA: Insufficient documentation

## 2022-05-01 DIAGNOSIS — R0609 Other forms of dyspnea: Secondary | ICD-10-CM | POA: Diagnosis not present

## 2022-05-01 DIAGNOSIS — I480 Paroxysmal atrial fibrillation: Secondary | ICD-10-CM

## 2022-05-01 DIAGNOSIS — Q2112 Patent foramen ovale: Secondary | ICD-10-CM | POA: Diagnosis not present

## 2022-05-01 NOTE — Patient Instructions (Signed)
Medication Instructions:  Your physician recommends the following medication changes.  STOP TAKING: Metoprolol 25 mg daily  *If you need a refill on your cardiac medications before your next appointment, please call your pharmacy*   Lab Work: None ordered today   Testing/Procedures: Your provider has ordered a Lexiscan/ Exercise Myoview Stress test. This will take place at [facility]. Please report to the [facility] medical mall entrance. The volunteers at the first desk will direct you where to go.  [place]  Your provider has ordered a Stress Test with nuclear imaging. The purpose of this test is to evaluate the blood supply to your heart muscle. This procedure is referred to as a "Non-Invasive Stress Test." This is because other than having an IV started in your vein, nothing is inserted or "invades" your body. Cardiac stress tests are done to find areas of poor blood flow to the heart by determining the extent of coronary artery disease (CAD). Some patients exercise on a treadmill, which naturally increases the blood flow to your heart, while others who are unable to walk on a treadmill due to physical limitations will have a pharmacologic/chemical stress agent called Lexiscan . This medicine will mimic walking on a treadmill by temporarily increasing your coronary blood flow.   Please note: these test may take anywhere between 2-4 hours to complete  How to prepare for your Myoview test:  Do not eat or drink after midnight No caffeine for 24 hours prior to test No smoking 24 hours prior to test. Your medication may be taken with water.  If your doctor stopped a medication because of this test, do not take that medication. Ladies, please do not wear dresses.  Skirts or pants are appropriate. Please wear a short sleeve shirt. No perfume, cologne or lotion. Wear comfortable walking shoes. No heels!   PLEASE NOTIFY THE OFFICE AT LEAST 77 HOURS IN ADVANCE IF YOU ARE UNABLE TO KEEP YOUR  APPOINTMENT.  [phone] AND  PLEASE NOTIFY NUCLEAR MEDICINE AT [facility] AT LEAST 24 HOURS IN ADVANCE IF YOU ARE UNABLE TO KEEP YOUR APPOINTMENT. [phone]    Follow-Up: At [facility], you and your health needs are our priority.  As part of our continuing mission to provide you with exceptional heart care, we have created designated Provider Care Teams.  These Care Teams include your primary Cardiologist (physician) and Advanced Practice Providers (APPs -  Physician Assistants and Nurse Practitioners) who all work together to provide you with the care you need, when you need it.  We recommend signing up for the patient portal called "MyChart".  Sign up information is provided on this After Visit Summary.  MyChart is used to connect with patients for Virtual Visits (Telemedicine).  Patients are able to view lab/test results, encounter notes, upcoming appointments, etc.  Non-urgent messages can be sent to your provider as well.   To learn more about what you can do with MyChart, go to [email or web address].    Your next appointment:   6 month(s)  The format for your next appointment:   In Person  Provider:   You may see Nelva Bush, MD or one of the following Advanced Practice Providers on your designated Care Team:   Murray Hodgkins, NP Christell Faith, PA-C Cadence Kathlen Mody, PA-C Gerrie Nordmann, NP

## 2022-05-01 NOTE — Progress Notes (Signed)
Follow-up Outpatient Visit Date: 05/01/2022  Primary Care Provider: Valerie Roys, DO [place] [id number]  Chief Complaint: Follow-up atrial fibrillation  HPI:  Zachary Rogers is a 71 y.o. male with history of stroke in the setting of PFO status post percutaneous closure and subsequent development of paroxysmal atrial fibrillation, hypertension, hyperlipidemia, and nephrolithiasis who presents for follow-up of atrial fibrillation.  He was last seen in our office in September by Tarri Glenn, [place], at which time he was noted to be bradycardic in sinus rhythm following cardioversion in August.  He was asymptomatic.  Metoprolol tartrate was decreased to 12.5 mg twice daily.  He was continued on apixaban for secondary prevention of stroke.  Today, Mr. Dominica Severin reports that he has been feeling well.  He has been spending much of his time caring for his wife, who is continued to struggle with knee problems.  Dominica Severin notes that he ran out of metoprolol a few weeks ago and that his heart rate is now in the low 60s (it was still in the low 40s or even upper 30s when on metoprolol 12.5 mg twice daily).  He has not had any palpitations to suggest recurrent atrial fibrillation.  He also denies chest pain, shortness of breath, and edema.  However, on further questioning, he reports that he has noticed some exertional dyspnea and decreased exercise tolerance over the last few years.  He notes occasional vertigo but no frank lightheadedness.  He is currently on amoxicillin, having undergone a root canal yesterday.  --------------------------------------------------------------------------------------------------  Cardiovascular History & Procedures: Cardiovascular Problems: Paroxysmal atrial fibrillation Stroke Patent foramen ovale   Risk Factors: Hypertension, hyperlipidemia, male gender, tobacco use, obesity, and age greater than 73   Cath/PCI: None   CV Surgery: PFO closure (02/13/2021): 18 mm  Amplatz atrial septal occluder   EP Procedures and Devices: DCCV (04/30/2021, 12/24/2021) 14-day event monitor (11/14/2020): Predominantly sinus rhythm with occasional PACs and rare PVCs as well as several brief runs of PSVT.  At least one episode of idioventricular rhythm was also noted.  No atrial fibrillation/flutter observed.   Non-Invasive Evaluation(s): Limited TTE (02/13/2021): Amplatz closure device in place with no residual PFO.  Mild right atrial enlargement.  Mild mitral annular calcification and mitral valve thickening.  Trivial tricuspid regurgitation.  Aortic sclerosis. TEE (12/17/2020): Normal LV size.  LVEF 60-65%.  Normal RV size and function.  Normal biatrial size without thrombus.  Normal mitral valve with mild regurgitation.  Normal tricuspid valve with trivial regurgitation.  Tricuspid aortic valve with trivial regurgitation.  Normal pulmonic valve with mild regurgitation.  Minimal plaque in the thoracic aorta.  Atrial septal aneurysm with large PFO and positive bubble study noted. Carotid Doppler (11/13/2020): Mild right ICA stenosis.  Normal left carotid artery.  Atypical antegrade flow in both vertebral arteries.  Flow disturbance in right subclavian artery noted. TTE (11/06/2020): Mildly dilated LV with normal wall thickness.  Normal LVEF (50-55%) with normal wall motion.  Indeterminate diastolic parameters.  Normal RV size and function.  Normal biatrial size.  Trivial mitral and tricuspid regurgitation.  Positive bubble study.  Recent CV Pertinent Labs: Lab Results  Component Value Date   CHOL 122 12/26/2021   HDL 73 12/26/2021   LDLCALC 36 12/26/2021   TRIG 63 12/26/2021   K 3.8 12/26/2021   MG 2.1 12/12/2021   BUN 15 12/26/2021   CREATININE 1.33 (H) 12/26/2021    Past medical and surgical history were reviewed and updated in EPIC.  Current Meds  Medication Sig   amLODipine (NORVASC) 5 MG tablet Take 1 tablet (5 mg total) by mouth daily.   amoxicillin (AMOXIL) 500 MG  capsule Take 1,000 mg by mouth 2 (two) times daily.   apixaban (ELIQUIS) 5 MG TABS tablet TAKE 1 TABLET TWICE A DAY   atorvastatin (LIPITOR) 80 MG tablet TAKE 1 TABLET DAILY   Multiple Vitamin (MULTIVITAMIN) capsule Take 1 capsule by mouth daily.   Omega-3 Fatty Acids (FISH OIL) 1000 MG CAPS Take 1,000 mg by mouth daily.   traZODone (DESYREL) 50 MG tablet Take 0.5-1 tablets (25-50 mg total) by mouth at bedtime as needed for sleep.    Allergies: Patient has no known allergies.  Social History   Tobacco Use   Smoking status: Former    Packs/day: 0.00    Years: 0.00    Total pack years: 0.00    Types: Cigarettes    Quit date: 2000    Years since quitting: 23.9   Smokeless tobacco: Never   Tobacco comments:    social smoker when younger   Vaping Use   Vaping Use: Never used  Substance Use Topics   Alcohol use: Yes    Alcohol/week: 5.0 standard drinks of alcohol    Types: 5 Glasses of wine per week   Drug use: No    Family History  Problem Relation Age of Onset   Lung cancer Mother    Hypertension Father    Diabetes Father    Transient ischemic attack Father    Heart attack Maternal Grandfather    Prostate cancer Neg Hx    Bladder Cancer Neg Hx    Kidney cancer Neg Hx     Review of Systems: A 12-system review of systems was performed and was negative except as noted in the HPI.  --------------------------------------------------------------------------------------------------  Physical Exam: BP 120/78 (BP Location: Left Arm, Patient Position: Sitting, Cuff Size: Large)   Pulse 64   Ht '6\' 3"'$  (1.905 m)   Wt 272 lb (123.4 kg)   SpO2 98%   BMI 34.00 kg/m   General:  NAD. Neck: No JVD or HJR. Lungs: Clear to auscultation bilaterally without wheezes or crackles. Heart: Regular rate and rhythm without murmurs, rubs, or gallops. Abdomen: Soft, nontender, nondistended. Extremities: No lower extremity edema.  EKG: Normal sinus rhythm with left axis deviation and  nonspecific ST changes.  Compared to prior tracing from 01/29/2022, heart rate has increased.  Lab Results  Component Value Date   WBC 8.3 12/12/2021   HGB 15.1 12/12/2021   HCT 43.2 12/12/2021   MCV 86.4 12/12/2021   PLT 179 12/12/2021    Lab Results  Component Value Date   NA 142 12/26/2021   K 3.8 12/26/2021   CL 106 12/26/2021   CO2 22 12/26/2021   BUN 15 12/26/2021   CREATININE 1.33 (H) 12/26/2021   GLUCOSE 95 12/26/2021   ALT 74 (H) 12/26/2021    Lab Results  Component Value Date   CHOL 122 12/26/2021   HDL 73 12/26/2021   LDLCALC 36 12/26/2021   TRIG 63 12/26/2021    --------------------------------------------------------------------------------------------------  ASSESSMENT AND PLAN: Paroxysmal atrial fibrillation: No symptoms to suggest recurrent atrial fibrillation.  Heart rate has increased and is now in the low 60s at rest since discontinuation of metoprolol.  We have agreed to refrain from restarting metoprolol.  Continue indefinite anticoagulation with apixaban.  If he were to have recurrent atrial fibrillation, consultation with EP will need to be considered.  Coronary  artery calcification, dyspnea on exertion, and decreased exercise tolerance: This has gradually been progressing over the last several months to years.  We have agreed to obtain an exercise MPI for further evaluation given coronary artery calcification noted on prior cross-sectional imaging of the chest..  PFO and history of stroke: Status post successful closure.  Continue apixaban, as outlined above, given history of PAF.  Hypertension: Blood pressure well-controlled today.  Continue current dose of amlodipine.  Hyperlipidemia: LDL well-controlled on last check in August.  Continue atorvastatin for secondary prevention in the setting of stroke.  Given normal triglycerides and history of paroxysmal atrial fibrillation, discontinuation of omega-3 should be considered.  Shared Decision  Making/Informed Consent The risks [chest pain, shortness of breath, cardiac arrhythmias, dizziness, blood pressure fluctuations, myocardial infarction, stroke/transient ischemic attack, nausea, vomiting, allergic reaction, radiation exposure, metallic taste sensation and life-threatening complications (estimated to be 1 in 10,000)], benefits (risk stratification, diagnosing coronary artery disease, treatment guidance) and alternatives of a nuclear stress test were discussed in detail with Mr. Lisa Blakeman and he agrees to proceed.  Follow-up: Return to clinic in 6 months.  Nelva Bush, MD 05/01/2022 11:06 AM

## 2022-05-02 ENCOUNTER — Encounter: Payer: Self-pay | Admitting: Internal Medicine

## 2022-05-05 ENCOUNTER — Other Ambulatory Visit: Payer: Medicare Other

## 2022-05-15 ENCOUNTER — Encounter
Admission: RE | Admit: 2022-05-15 | Discharge: 2022-05-15 | Disposition: A | Discharge status: Home / Self Care | Payer: Medicare Other | Source: Ambulatory Visit | Attending: Internal Medicine | Admitting: Internal Medicine

## 2022-05-15 DIAGNOSIS — I2510 Atherosclerotic heart disease of native coronary artery without angina pectoris: Secondary | ICD-10-CM | POA: Diagnosis not present

## 2022-05-15 DIAGNOSIS — R0609 Other forms of dyspnea: Secondary | ICD-10-CM | POA: Diagnosis not present

## 2022-05-15 LAB — NM MYOCAR MULTI W/SPECT W/WALL MOTION / EF
Angina Index: 0
Duke Treadmill Score: 5
Estimated workload: 7
Exercise duration (min): 5 min
Exercise duration (sec): 28 s
LV dias vol: 133 mL (ref 62–150)
LV sys vol: 51 mL
MPHR: 147 {beats}/min
Nuc Stress EF: 62 %
Peak HR: 144 {beats}/min
Percent HR: 97 %
Rest HR: 54 {beats}/min
Rest Nuclear Isotope Dose: 10.1 mCi
SDS: 3
SRS: 2
SSS: 1
ST Depression (mm): 0 mm
Stress Nuclear Isotope Dose: 28.4 mCi
TID: 0.83

## 2022-05-15 MED ORDER — TECHNETIUM TC 99M TETROFOSMIN IV KIT
10.1000 | PACK | Freq: Once | INTRAVENOUS | Status: AC | PRN
  Administered 2022-05-15: 10.1 via INTRAVENOUS

## 2022-05-15 MED ORDER — TECHNETIUM TC 99M TETROFOSMIN IV KIT
28.3900 | PACK | Freq: Once | INTRAVENOUS | Status: AC | PRN
  Administered 2022-05-15: 28.39 via INTRAVENOUS

## 2022-06-09 ENCOUNTER — Other Ambulatory Visit: Payer: Self-pay | Admitting: Family Medicine

## 2022-06-10 NOTE — Telephone Encounter (Signed)
Requested Prescriptions  Pending Prescriptions Disp Refills   amLODipine (NORVASC) 5 MG tablet [Pharmacy Med Name: AMLODIPINE BESYLATE TABS '5MG'$ ] 90 tablet 0    Sig: TAKE 1 TABLET DAILY     Cardiovascular: Calcium Channel Blockers 2 Passed - 06/09/2022 11:46 PM      Passed - Last BP in normal range    BP Readings from Last 1 Encounters:  05/01/22 120/78         Passed - Last Heart Rate in normal range    Pulse Readings from Last 1 Encounters:  05/01/22 64         Passed - Valid encounter within last 6 months    Recent Outpatient Visits           5 months ago Persistent atrial fibrillation [facility])   [place], [place], DO   11 months ago Essential hypertension   [place], [place], DO   1 year ago Essential hypertension   [place], [place], DO   1 year ago PFO (patent foramen ovale)   [place], [place] P, DO   1 year ago Mixed hyperlipidemia   [place], Barb Merino, DO       Future Appointments             In 2 weeks Wynetta Emery, Barb Merino, DO [place], [place]   In 3 months [place], Gordan Payment [place]   In 4 months [place], Harrell Gave, MD [place] at [facility]

## 2022-06-30 ENCOUNTER — Ambulatory Visit (INDEPENDENT_AMBULATORY_CARE_PROVIDER_SITE_OTHER): Payer: Medicare Other | Admitting: Family Medicine

## 2022-06-30 ENCOUNTER — Encounter: Payer: Self-pay | Admitting: Family Medicine

## 2022-06-30 VITALS — BP 138/73 | HR 44 | Temp 98.1°F | Ht 75.0 in | Wt 276.8 lb

## 2022-06-30 DIAGNOSIS — B351 Tinea unguium: Secondary | ICD-10-CM | POA: Diagnosis not present

## 2022-06-30 DIAGNOSIS — I4819 Other persistent atrial fibrillation: Secondary | ICD-10-CM

## 2022-06-30 DIAGNOSIS — I700 Atherosclerosis of aorta: Secondary | ICD-10-CM | POA: Diagnosis not present

## 2022-06-30 DIAGNOSIS — M25512 Pain in left shoulder: Secondary | ICD-10-CM | POA: Diagnosis not present

## 2022-06-30 DIAGNOSIS — I10 Essential (primary) hypertension: Secondary | ICD-10-CM | POA: Diagnosis not present

## 2022-06-30 DIAGNOSIS — G8929 Other chronic pain: Secondary | ICD-10-CM

## 2022-06-30 DIAGNOSIS — E785 Hyperlipidemia, unspecified: Secondary | ICD-10-CM

## 2022-06-30 DIAGNOSIS — I2510 Atherosclerotic heart disease of native coronary artery without angina pectoris: Secondary | ICD-10-CM

## 2022-06-30 MED ORDER — APIXABAN 5 MG PO TABS: 5.0000 mg | ORAL_TABLET | Freq: Two times a day (BID) | ORAL | 1 refills | Status: DC

## 2022-06-30 MED ORDER — ATORVASTATIN CALCIUM 80 MG PO TABS: 80.0000 mg | ORAL_TABLET | Freq: Every day | ORAL | 1 refills | Status: DC

## 2022-06-30 MED ORDER — AMLODIPINE BESYLATE 5 MG PO TABS: 7.5000 mg | ORAL_TABLET | Freq: Every day | ORAL | 1 refills | Status: DC

## 2022-06-30 NOTE — Assessment & Plan Note (Signed)
Will keep BP and cholesterol under good control. Continue to monitor. Call with any concerns. Continue to follow with cardiology.

## 2022-06-30 NOTE — Progress Notes (Signed)
BP 138/73   Pulse (!) 44   Temp 98.1 F (36.7 C) (Oral)   Ht 6' 3"$  (1.905 m)   Wt 276 lb 12.8 oz (125.6 kg)   SpO2 96%   BMI 34.60 kg/m    Subjective:    Patient ID: Zachary Rogers, male    DOB: 1950/06/08, 72 y.o.   MRN: [id number]  HPI: Zachary Rogers is a 72 y.o. male  Chief Complaint  Patient presents with   Hyperlipidemia   Atrial Fibrillation   Hypertension    Patient says he has noticed that his blood pressure readings have been elevated.    Ingrown Toenail    Patient says he noticed some ingrown toenails and also noticed some fungus.    Shoulder Pain   HYPERTENSION / HYPERLIPIDEMIA Satisfied with current treatment? no Duration of hypertension: chronic BP monitoring frequency: a few times a week BP range: 140s-150s at home BP medication side effects: no Past BP meds: amlodipine Duration of hyperlipidemia: chronic Cholesterol medication side effects: no Cholesterol supplements: fish oil Past cholesterol medications: atorvastatin Medication compliance: excellent compliance Aspirin: no Recent stressors: no Recurrent headaches: no Visual changes: no Palpitations: no Dyspnea: no Chest pain: no Lower extremity edema: no Dizzy/lightheaded: no  SHOULDER PAIN Duration: chronic Involved shoulder: left Mechanism of injury:  lifting weights and formerly playing baseball Location: lateral Onset:gradual Severity: moderate  Quality:  aching and sore Frequency: intermittent Radiation: no Aggravating factors: movement and throwing  Alleviating factors: ice, APAP, NSAIDs, and rest  Status: stable Treatments attempted: rest, ice, heat, APAP, ibuprofen, and aleve  Relief with NSAIDs?:  significant Weakness: no Numbness: no Decreased grip strength: yes Redness: no Swelling: no Bruising: no Fevers: no  Has toenail fungus- was taking oral lamisil about 9 years ago and didn't finish the course. Would like to go back to see podiatry to get it sorted. No  other concerns or complaints at this time.   Relevant past medical, surgical, family and social history reviewed and updated as indicated. Interim medical history since our last visit reviewed. Allergies and medications reviewed and updated.  Review of Systems  Constitutional: Negative.   Respiratory: Negative.    Cardiovascular: Negative.   Gastrointestinal: Negative.   Musculoskeletal: Negative.   Psychiatric/Behavioral: Negative.      Per HPI unless specifically indicated above     Objective:    BP 138/73   Pulse (!) 44   Temp 98.1 F (36.7 C) (Oral)   Ht 6' 3"$  (1.905 m)   Wt 276 lb 12.8 oz (125.6 kg)   SpO2 96%   BMI 34.60 kg/m   Wt Readings from Last 3 Encounters:  06/30/22 276 lb 12.8 oz (125.6 kg)  05/01/22 272 lb (123.4 kg)  01/29/22 269 lb 6 oz (122.2 kg)    Physical Exam Vitals and nursing note reviewed.  Constitutional:      General: He is not in acute distress.    Appearance: Normal appearance. He is normal weight. He is not ill-appearing, toxic-appearing or diaphoretic.  HENT:     Head: Normocephalic and atraumatic.     Right Ear: External ear normal.     Left Ear: External ear normal.     Nose: Nose normal.     Mouth/Throat:     Mouth: Mucous membranes are moist.     Pharynx: Oropharynx is clear.  Eyes:     General: No scleral icterus.       Right eye: No discharge.  Left eye: No discharge.     Extraocular Movements: Extraocular movements intact.     Conjunctiva/sclera: Conjunctivae normal.     Pupils: Pupils are equal, round, and reactive to light.  Cardiovascular:     Rate and Rhythm: Normal rate and regular rhythm.     Pulses: Normal pulses.     Heart sounds: Normal heart sounds. No murmur heard.    No friction rub. No gallop.  Pulmonary:     Effort: Pulmonary effort is normal. No respiratory distress.     Breath sounds: Normal breath sounds. No stridor. No wheezing, rhonchi or rales.  Chest:     Chest wall: No tenderness.   Musculoskeletal:        General: Normal range of motion.     Cervical back: Normal range of motion and neck supple.  Skin:    General: Skin is warm and dry.     Capillary Refill: Capillary refill takes less than 2 seconds.     Coloration: Skin is not jaundiced or pale.     Findings: No bruising, erythema, lesion or rash.  Neurological:     General: No focal deficit present.     Mental Status: He is alert and oriented to person, place, and time. Mental status is at baseline.  Psychiatric:        Mood and Affect: Mood normal.        Behavior: Behavior normal.        Thought Content: Thought content normal.        Judgment: Judgment normal.     Results for orders placed or performed during the hospital encounter of 05/15/22  NM Myocar Multi W/Spect W/Wall Motion / EF  Result Value Ref Range   Angina Index 0    Duke Treadmill Score 5    Rest HR 54.0 bpm   Rest BP 156/76 mmHg   Exercise duration (min) 5 min   Exercise duration (sec) 28 sec   Estimated workload 7.0    Peak HR 144 bpm   Peak BP 246/91 mmHg   MPHR 147 bpm   Percent HR 97.0 %   ST Depression (mm) 0 mm   Rest Nuclear Isotope Dose 10.1 mCi   Stress Nuclear Isotope Dose 28.4 mCi   SSS 1.0    SRS 2.0    SDS 3.0    TID 0.83    LV sys vol 51.0 mL   LV dias vol 133.0 62 - 150 mL   Nuc Stress EF 62 %      Assessment & Plan:   Problem List Items Addressed This Visit       Cardiovascular and Mediastinum   Essential hypertension - Primary    Not stable on current regimen. Will increase amlodipine form 60m to 7.556m Will follow up in 1 month.       Relevant Medications   amLODipine (NORVASC) 5 MG tablet   apixaban (ELIQUIS) 5 MG TABS tablet   atorvastatin (LIPITOR) 80 MG tablet   Other Relevant Orders   CBC with Differential/Platelet   Comprehensive metabolic panel   Aortic atherosclerosis (HCC)    Will keep BP and cholesterol under good control. Continue to monitor. Call with any concerns. Continue to  follow with cardiology.       Relevant Medications   amLODipine (NORVASC) 5 MG tablet   apixaban (ELIQUIS) 5 MG TABS tablet   atorvastatin (LIPITOR) 80 MG tablet   Other Relevant Orders   CBC with Differential/Platelet   Coronary  artery disease involving native coronary artery of native heart without angina pectoris    Will keep BP and cholesterol under good control. Continue to monitor. Call with any concerns. Continue to follow with cardiology.       Relevant Medications   amLODipine (NORVASC) 5 MG tablet   apixaban (ELIQUIS) 5 MG TABS tablet   atorvastatin (LIPITOR) 80 MG tablet   Persistent atrial fibrillation (HCC)    Stable on current regimen. Will continue current regimen. Labs done today. Will follow up in 1 month.       Relevant Medications   amLODipine (NORVASC) 5 MG tablet   apixaban (ELIQUIS) 5 MG TABS tablet   atorvastatin (LIPITOR) 80 MG tablet     Other   Hyperlipidemia LDL goal <70    Controlled on current regimen. Continue current regimen. Labs done today. Will follow up in 1 month.       Relevant Medications   amLODipine (NORVASC) 5 MG tablet   apixaban (ELIQUIS) 5 MG TABS tablet   atorvastatin (LIPITOR) 80 MG tablet   Other Relevant Orders   CBC with Differential/Platelet   Comprehensive metabolic panel   Lipid Panel w/o Chol/HDL Ratio   Other Visit Diagnoses     Onychomycosis       Referral to podiatry placed today. Call with any concerns. Continue to monitor.   Relevant Orders   Ambulatory referral to Podiatry   Chronic left shoulder pain       Order for x-ray and referral to PT placed today. Continue to monitor. Call with any concerns.   Relevant Orders   DG Shoulder Left   Ambulatory referral to Physical Therapy        Follow up plan: Return in about 4 weeks (around 07/28/2022) for BP follow up.

## 2022-06-30 NOTE — Assessment & Plan Note (Signed)
Not stable on current regimen. Will increase amlodipine form 86m to 7.564m Will follow up in 1 month.

## 2022-06-30 NOTE — Assessment & Plan Note (Signed)
Stable on current regimen. Will continue current regimen. Labs done today. Will follow up in 1 month.

## 2022-06-30 NOTE — Progress Notes (Signed)
Nurse Practitioner Fellow- please see note by Park Liter, DO for official note.     Established Patient Office Visit  Subjective   Patient ID: Zachary Rogers, male    DOB: [birth year]  Age: 72 y.o. MRN: [id number]  Chief Complaint  Patient presents with   Hyperlipidemia   Atrial Fibrillation   Hypertension    Patient says he has noticed that his blood pressure readings have been elevated.    Ingrown Toenail    Patient says he noticed some ingrown toenails and also noticed some fungus.    Shoulder Pain    HYPERTENSION  Hypertension status: stable  Satisfied with current treatment? yes Duration of hypertension: Chronic BP monitoring frequency: Once weekly BP range: 140s-150s/80-85. HR 48-52.  BP medication side effects:None Medication compliance: excellent compliance Previous BP meds: Amlodipine 68m Aspirin: no Recurrent headaches: no Visual changes: no Palpitations: no Dyspnea: no Chest pain: no Lower extremity edema: no Dizzy/lightheaded: no   HYPERLIPIDEMIA Hyperlipidemia status: excellent compliance Satisfied with current treatment?  yes Side effects:  no Medication compliance: excellent compliance Past cholesterol meds: atorvastain (lipitor) 841mSupplements: fish oil Aspirin:  no The ASCVD Risk score (Arnett DK, et al., 2019) failed to calculate for the following reasons:   The patient has a prior MI or stroke diagnosis Chest pain:  no Coronary artery disease:  yes Family history CAD:  no Family history early CAD:  no  Exercising 5x/week for 30 minutes.   ATRIAL FIBRILLATION Atrial fibrillation status: stable Satisfied with current treatment: yes  Medication side effects:  no Medication compliance: excellent compliance Etiology of atrial fibrillation:  Palpitations:  no Chest pain:  no Dyspnea on exertion:  no Orthopnea:  no Syncope:  no Edema:  no Ventricular rate control: Not indicated Anti-coagulation: Eliquis 66m41mNo excessive bruising or  bleeding.   SHOULDER PAIN Duration: chronic Involved shoulder: left Mechanism of injury: Activity; exercising, lifting weights played baseball Location: lateral Onset:gradual Severity: 4/10  Quality:  Aching  Frequency: occasional Radiation: no Aggravating factors: lifting, movement, sleep, and throwing  Alleviating factors: NSAIDs; Aleve every other day. X1 in the morning for prevention  Status: stable Treatments attempted: Aleve  Relief with NSAIDs?:  moderate Weakness: no Numbness: no Decreased grip strength:no Redness: no Swelling: no Bruising: no Fevers: no   Right and Left big toe fungus/ ingrown toe nail that started over 9 years ago, denies itching, pain, redness.Interferes with ADLs occasionally with standing. Tried antifungal 9 years ago but was not able to complete course. Willing to retry medication.  Stopped taking Trazodone; as well as limiting caffeine intake to when he is travelling. He no longer has issues with falling asleep or staying asleep.       Review of Systems  Constitutional:  Negative for chills.  Respiratory:  Negative for cough, hemoptysis, sputum production and wheezing.   Cardiovascular: Negative.  Negative for claudication and leg swelling.  Musculoskeletal:        Left lateral shoulder/arm pain  Neurological:  Positive for sensory change.       Visual disorientation; with blurred vision for 20 seconds, has to refocus.   Endo/Heme/Allergies:  Does not bruise/bleed easily.      Objective:     BP 138/73   Pulse (!) 44   Temp 98.1 F (36.7 C) (Oral)   Ht 6' 3"$  (1.905 m)   Wt 276 lb 12.8 oz (125.6 kg)   SpO2 96%   BMI 34.60 kg/m    Physical Exam Constitutional:  Appearance: Normal appearance.  HENT:     Head: Normocephalic and atraumatic.  Cardiovascular:     Rate and Rhythm: Bradycardia present.  Pulmonary:     Effort: Pulmonary effort is normal.     Breath sounds: Normal breath sounds.  Musculoskeletal:         General: Normal range of motion.     Cervical back: Normal range of motion.  Neurological:     Mental Status: He is alert and oriented to person, place, and time.  Psychiatric:        Mood and Affect: Mood normal.        Behavior: Behavior normal.        Thought Content: Thought content normal.        Judgment: Judgment normal.      No results found for any visits on 06/30/22.    The ASCVD Risk score (Arnett DK, et al., 2019) failed to calculate for the following reasons:   The patient has a prior MI or stroke diagnosis    Assessment & Plan:   Problem List Items Addressed This Visit       Cardiovascular and Mediastinum   Essential hypertension - Primary    Not stable on current regimen. Will increase amlodipine form 61m to 7.521m Will follow up in 1 month.       Relevant Medications   amLODipine (NORVASC) 5 MG tablet   apixaban (ELIQUIS) 5 MG TABS tablet   atorvastatin (LIPITOR) 80 MG tablet   Other Relevant Orders   CBC with Differential/Platelet   Comprehensive metabolic panel   Aortic atherosclerosis (HCC)    Will keep BP and cholesterol under good control. Continue to monitor. Call with any concerns. Continue to follow with cardiology.       Relevant Medications   amLODipine (NORVASC) 5 MG tablet   apixaban (ELIQUIS) 5 MG TABS tablet   atorvastatin (LIPITOR) 80 MG tablet   Other Relevant Orders   CBC with Differential/Platelet   Coronary artery disease involving native coronary artery of native heart without angina pectoris    Will keep BP and cholesterol under good control. Continue to monitor. Call with any concerns. Continue to follow with cardiology.       Relevant Medications   amLODipine (NORVASC) 5 MG tablet   apixaban (ELIQUIS) 5 MG TABS tablet   atorvastatin (LIPITOR) 80 MG tablet   Persistent atrial fibrillation (HCC)    Stable on current regimen. Will continue current regimen. Labs done today. Will follow up in 1 month.       Relevant  Medications   amLODipine (NORVASC) 5 MG tablet   apixaban (ELIQUIS) 5 MG TABS tablet   atorvastatin (LIPITOR) 80 MG tablet     Other   Hyperlipidemia LDL goal <70    Controlled on current regimen. Continue current regimen. Labs done today. Will follow up in 1 month.       Relevant Medications   amLODipine (NORVASC) 5 MG tablet   apixaban (ELIQUIS) 5 MG TABS tablet   atorvastatin (LIPITOR) 80 MG tablet   Other Relevant Orders   CBC with Differential/Platelet   Comprehensive metabolic panel   Lipid Panel w/o Chol/HDL Ratio   Other Visit Diagnoses     Onychomycosis       Referral to podiatry placed today. Call with any concerns. Continue to monitor.   Relevant Orders   Ambulatory referral to Podiatry   Chronic left shoulder pain       Order for x-ray and  referral to PT placed today. Continue to monitor. Call with any concerns.   Relevant Orders   DG Shoulder Left   Ambulatory referral to Physical Therapy       Return in about 4 weeks (around 07/28/2022) for BP follow up.    Rashelle Pearley

## 2022-06-30 NOTE — Assessment & Plan Note (Signed)
Controlled on current regimen. Continue current regimen. Labs done today. Will follow up in 1 month.

## 2022-07-01 ENCOUNTER — Other Ambulatory Visit: Payer: Self-pay | Admitting: Family Medicine

## 2022-07-01 DIAGNOSIS — R748 Abnormal levels of other serum enzymes: Secondary | ICD-10-CM

## 2022-07-01 LAB — CBC WITH DIFFERENTIAL/PLATELET
Basophils Absolute: 0.1 10*3/uL (ref 0.0–0.2)
Basos: 1 %
EOS (ABSOLUTE): 0.4 10*3/uL (ref 0.0–0.4)
Eos: 8 %
Hematocrit: 42.5 % (ref 37.5–51.0)
Hemoglobin: 15.1 g/dL (ref 13.0–17.7)
Immature Grans (Abs): 0 10*3/uL (ref 0.0–0.1)
Immature Granulocytes: 0 %
Lymphocytes Absolute: 1.7 10*3/uL (ref 0.7–3.1)
Lymphs: 33 %
MCH: 31.1 pg (ref 26.6–33.0)
MCHC: 35.5 g/dL (ref 31.5–35.7)
MCV: 88 fL (ref 79–97)
Monocytes Absolute: 0.5 10*3/uL (ref 0.1–0.9)
Monocytes: 9 %
Neutrophils Absolute: 2.5 10*3/uL (ref 1.4–7.0)
Neutrophils: 49 %
Platelets: 165 10*3/uL (ref 150–450)
RBC: 4.85 x10E6/uL (ref 4.14–5.80)
RDW: 13 % (ref 11.6–15.4)
WBC: 5.1 10*3/uL (ref 3.4–10.8)

## 2022-07-01 LAB — LIPID PANEL W/O CHOL/HDL RATIO
Cholesterol, Total: 131 mg/dL (ref 100–199)
HDL: 79 mg/dL (ref >39)
LDL Chol Calc (NIH): 37 mg/dL (ref 0–99)
Triglycerides: 77 mg/dL (ref 0–149)
VLDL Cholesterol Cal: 15 mg/dL (ref 5–40)

## 2022-07-01 LAB — COMPREHENSIVE METABOLIC PANEL
ALT: 121 IU/L — ABNORMAL HIGH (ref 0–44)
AST: 167 IU/L — ABNORMAL HIGH (ref 0–40)
Albumin/Globulin Ratio: 1.8 (ref 1.2–2.2)
Albumin: 4.4 g/dL (ref 3.8–4.8)
Alkaline Phosphatase: 103 IU/L (ref 44–121)
BUN/Creatinine Ratio: 13 (ref 10–24)
BUN: 15 mg/dL (ref 8–27)
Bilirubin Total: 1.8 mg/dL — ABNORMAL HIGH (ref 0.0–1.2)
CO2: 22 mmol/L (ref 20–29)
Calcium: 9.4 mg/dL (ref 8.6–10.2)
Chloride: 104 mmol/L (ref 96–106)
Creatinine, Ser: 1.16 mg/dL (ref 0.76–1.27)
Globulin, Total: 2.4 g/dL (ref 1.5–4.5)
Glucose: 105 mg/dL — ABNORMAL HIGH (ref 70–99)
Potassium: 4.4 mmol/L (ref 3.5–5.2)
Sodium: 142 mmol/L (ref 134–144)
Total Protein: 6.8 g/dL (ref 6.0–8.5)
eGFR: 67 mL/min/{1.73_m2} (ref >59)

## 2022-07-08 ENCOUNTER — Ambulatory Visit
Admission: RE | Admit: 2022-07-08 | Discharge: 2022-07-08 | Disposition: A | Discharge status: Home / Self Care | Payer: Medicare Other | Source: Home / Self Care | Attending: Family Medicine | Admitting: Family Medicine

## 2022-07-08 ENCOUNTER — Ambulatory Visit
Admission: RE | Admit: 2022-07-08 | Discharge: 2022-07-08 | Disposition: A | Discharge status: Home / Self Care | Payer: Medicare Other | Source: Ambulatory Visit | Attending: Family Medicine | Admitting: Family Medicine

## 2022-07-08 DIAGNOSIS — M19012 Primary osteoarthritis, left shoulder: Secondary | ICD-10-CM | POA: Diagnosis not present

## 2022-07-08 DIAGNOSIS — G8929 Other chronic pain: Secondary | ICD-10-CM

## 2022-07-08 DIAGNOSIS — M25512 Pain in left shoulder: Secondary | ICD-10-CM | POA: Insufficient documentation

## 2022-07-08 DIAGNOSIS — R748 Abnormal levels of other serum enzymes: Secondary | ICD-10-CM

## 2022-07-08 DIAGNOSIS — R945 Abnormal results of liver function studies: Secondary | ICD-10-CM | POA: Diagnosis not present

## 2022-07-18 ENCOUNTER — Telehealth: Payer: Self-pay | Admitting: Family Medicine

## 2022-07-18 NOTE — Telephone Encounter (Signed)
Copied from [place] [id number]. Topic: Medicare AWV >> Jul 18, 2022 11:37 AM Devoria Glassing wrote: Reason for CRM: Called patient to schedule Medicare Annual Wellness Visit (AWV). Left message for patient to call back and schedule Medicare Annual Wellness Visit (AWV).  Last date of AWV: 07/25/2021  Please schedule an appointment at any time with Kirke Shaggy, [facility]    If any questions, please contact me.  Thank you ,  Sherol Dade; [place] Direct Dial: [phone]

## 2022-07-22 ENCOUNTER — Encounter: Payer: Self-pay | Admitting: Podiatry

## 2022-07-22 ENCOUNTER — Ambulatory Visit (INDEPENDENT_AMBULATORY_CARE_PROVIDER_SITE_OTHER): Payer: Medicare Other | Admitting: Podiatry

## 2022-07-22 DIAGNOSIS — L600 Ingrowing nail: Secondary | ICD-10-CM | POA: Diagnosis not present

## 2022-07-22 MED ORDER — CLINDAMYCIN HCL 300 MG PO CAPS: 300.0000 mg | ORAL_CAPSULE | Freq: Three times a day (TID) | ORAL | 0 refills | Status: DC

## 2022-07-22 NOTE — Progress Notes (Addendum)
Chief Complaint  Patient presents with   Nail Problem    "I have toe fungus on both big toes and I have an ingrown toenail on my left big toe." N - fungal nail L - Hallux bilateral D - 15 years O - gradually worse C - ingrown left hallux, discoloration, buildup underneath the nails, thick, spreading A - shoes, pressure T - clip when I can    Subjective: Patient presents today for evaluation of pain to the medial lateral border bilateral great toes. Patient is concerned for possible ingrown nail.  It is very sensitive to touch.  Patient is also concerned due to discoloration with thickening to the bilateral toenails as well as the right second digit.  When he served in [employer] he was diagnosed with fungal toenails.  He has not done anything recently for treatment.  Patient presents today for further treatment and evaluation.  Past Medical History:  Diagnosis Date   Actinic keratosis    R lat neck - bx proven with verruca vulgaris   Cerebrovascular accident (CVA) ([place]) 10/25/2020   Dysplastic nevus 06/22/2017   L mid back parapinal -severe, excision 10/06/2017   Hypertension    Lateral epicondylitis (tennis elbow) 09/04/2020   Mixed hyperlipidemia 07/18/2019   Nephrolithiasis     Objective:  General: Well developed, nourished, in no acute distress, alert and oriented x3   Dermatology: Skin is warm, dry and supple bilateral.  Medial and lateral border bilateral great toenails is tender with evidence of an ingrowing nail. Pain on palpation noted to the border of the nail fold. The remaining nails appear unremarkable at this time. There are no open sores, lesions.  There is some hyperkeratotic thickening with discoloration noted to the bilateral toenails consistent with fungal nail infection  Vascular: DP and PT pulses palpable.  No clinical evidence of vascular compromise  Neruologic: Grossly intact via light touch bilateral.  Musculoskeletal: No pedal deformity  noted  Assesement: #1 Paronychia with ingrowing nail medial lateral border bilateral great toenails #2 fungal nail infection bilateral great toes and right second digit  Plan of Care:  1. Patient evaluated.  2. Discussed treatment alternatives and plan of care. Explained nail avulsion procedure and post procedure course to patient. 3. Patient opted for permanent partial nail avulsion of the ingrown portion of the nail.  4. Prior to procedure, local anesthesia infiltration utilized using 3 ml of a 50:50 mixture of 2% plain lidocaine and 0.5% plain marcaine in a normal hallux block fashion and a betadine prep performed.  5. Partial permanent nail avulsion with chemical matrixectomy performed using [id number] applications of phenol followed by alcohol flush.  6. Light dressing applied.  Post care instructions provided 7.  Regards to the fungal nail infection of the bilateral toenails, we discussed different treatment options including oral, topical, and laser antifungal treatment modalities.  Efficacies as well as risks and benefits of each were explained and discussed in detail.  After discussing with the patient he opts for oral antifungal medication.  Patient had comprehensive metabolic panel [id number].  AST ALT levels were elevated.  Patient needs normal hepatic function prior to oral antifungal regimen.  In the meantime OTC Tolcylen antifungal topical dispensed at checkout 8.  Rx clindamycin '300mg'$  TID #30 for prophylaxis 9. Return to clinic 3 weeks ingrown follow-up  *Leaving for a '[place]ues Cruise' in 2 weeks.  Retired Animator  Edrick Kins, DPM [facility]  Dr. Edrick Kins,  DPM    [address],  [address]                Office [phone]  Fax [phone]

## 2022-07-22 NOTE — Addendum Note (Signed)
Addended by: Edrick Kins on: 07/22/2022 03:03 PM   Modules accepted: Orders

## 2022-07-29 ENCOUNTER — Ambulatory Visit (INDEPENDENT_AMBULATORY_CARE_PROVIDER_SITE_OTHER): Payer: Medicare Other | Admitting: Family Medicine

## 2022-07-29 ENCOUNTER — Encounter: Payer: Self-pay | Admitting: Family Medicine

## 2022-07-29 VITALS — BP 129/67 | HR 51 | Temp 98.0°F | Ht 75.0 in | Wt 279.4 lb

## 2022-07-29 DIAGNOSIS — I10 Essential (primary) hypertension: Secondary | ICD-10-CM

## 2022-07-29 DIAGNOSIS — R748 Abnormal levels of other serum enzymes: Secondary | ICD-10-CM

## 2022-07-29 NOTE — Progress Notes (Signed)
BP 129/67   Pulse (!) 51   Temp 98 F (36.7 C) (Oral)   Ht '6\' 3"'$  (1.905 m)   Wt 279 lb 6.4 oz (126.7 kg)   SpO2 97%   BMI 34.92 kg/m    Subjective:    Patient ID: Zachary Rogers, male    DOB: 02/26/1951, 72 y.o.   MRN: [id number]  HPI: Zachary Rogers is a 72 y.o. male  Chief Complaint  Patient presents with   Hypertension   HYPERTENSION  Hypertension status: controlled  Satisfied with current treatment? yes Duration of hypertension: chronic BP monitoring frequency:  not checking BP medication side effects:  no Medication compliance: excellent compliance Previous BP meds:amlodipine Aspirin: no Recurrent headaches: no Visual changes: no Palpitations: no Dyspnea: no Chest pain: no Lower extremity edema: no Dizzy/lightheaded: no   Relevant past medical, surgical, family and social history reviewed and updated as indicated. Interim medical history since our last visit reviewed. Allergies and medications reviewed and updated.  Review of Systems  Constitutional: Negative.   Respiratory: Negative.    Cardiovascular: Negative.   Gastrointestinal: Negative.   Musculoskeletal: Negative.   Psychiatric/Behavioral: Negative.      Per HPI unless specifically indicated above     Objective:    BP 129/67   Pulse (!) 51   Temp 98 F (36.7 C) (Oral)   Ht '6\' 3"'$  (1.905 m)   Wt 279 lb 6.4 oz (126.7 kg)   SpO2 97%   BMI 34.92 kg/m   Wt Readings from Last 3 Encounters:  07/29/22 279 lb 6.4 oz (126.7 kg)  06/30/22 276 lb 12.8 oz (125.6 kg)  05/01/22 272 lb (123.4 kg)    Physical Exam Vitals and nursing note reviewed.  Constitutional:      General: He is not in acute distress.    Appearance: Normal appearance. He is not ill-appearing, toxic-appearing or diaphoretic.  HENT:     Head: Normocephalic and atraumatic.     Right Ear: External ear normal.     Left Ear: External ear normal.     Nose: Nose normal.     Mouth/Throat:     Mouth: Mucous membranes are  moist.     Pharynx: Oropharynx is clear.  Eyes:     General: No scleral icterus.       Right eye: No discharge.        Left eye: No discharge.     Extraocular Movements: Extraocular movements intact.     Conjunctiva/sclera: Conjunctivae normal.     Pupils: Pupils are equal, round, and reactive to light.  Cardiovascular:     Rate and Rhythm: Normal rate and regular rhythm.     Pulses: Normal pulses.     Heart sounds: Normal heart sounds. No murmur heard.    No friction rub. No gallop.  Pulmonary:     Effort: Pulmonary effort is normal. No respiratory distress.     Breath sounds: Normal breath sounds. No stridor. No wheezing, rhonchi or rales.  Chest:     Chest wall: No tenderness.  Musculoskeletal:        General: Normal range of motion.     Cervical back: Normal range of motion and neck supple.  Skin:    General: Skin is warm and dry.     Capillary Refill: Capillary refill takes less than 2 seconds.     Coloration: Skin is not jaundiced or pale.     Findings: No bruising, erythema, lesion or rash.  Neurological:     General: No focal deficit present.     Mental Status: He is alert and oriented to person, place, and time. Mental status is at baseline.  Psychiatric:        Mood and Affect: Mood normal.        Behavior: Behavior normal.        Thought Content: Thought content normal.        Judgment: Judgment normal.     Results for orders placed or performed in visit on 06/30/22  CBC with Differential/Platelet  Result Value Ref Range   WBC 5.1 3.4 - 10.8 x10E3/uL   RBC 4.85 4.14 - 5.80 x10E6/uL   Hemoglobin 15.1 13.0 - 17.7 g/dL   Hematocrit 42.5 37.5 - 51.0 %   MCV 88 79 - 97 fL   MCH 31.1 26.6 - 33.0 pg   MCHC 35.5 31.5 - 35.7 g/dL   RDW 13.0 11.6 - 15.4 %   Platelets 165 150 - 450 x10E3/uL   Neutrophils 49 Not Estab. %   Lymphs 33 Not Estab. %   Monocytes 9 Not Estab. %   Eos 8 Not Estab. %   Basos 1 Not Estab. %   Neutrophils Absolute 2.5 1.4 - 7.0 x10E3/uL    Lymphocytes Absolute 1.7 0.7 - 3.1 x10E3/uL   Monocytes Absolute 0.5 0.1 - 0.9 x10E3/uL   EOS (ABSOLUTE) 0.4 0.0 - 0.4 x10E3/uL   Basophils Absolute 0.1 0.0 - 0.2 x10E3/uL   Immature Granulocytes 0 Not Estab. %   Immature Grans (Abs) 0.0 0.0 - 0.1 x10E3/uL  Comprehensive metabolic panel  Result Value Ref Range   Glucose 105 (H) 70 - 99 mg/dL   BUN 15 8 - 27 mg/dL   Creatinine, Ser 1.16 0.76 - 1.27 mg/dL   eGFR 67 >59 mL/min/1.73   BUN/Creatinine Ratio 13 10 - 24   Sodium 142 134 - 144 mmol/L   Potassium 4.4 3.5 - 5.2 mmol/L   Chloride 104 96 - 106 mmol/L   CO2 22 20 - 29 mmol/L   Calcium 9.4 8.6 - 10.2 mg/dL   Total Protein 6.8 6.0 - 8.5 g/dL   Albumin 4.4 3.8 - 4.8 g/dL   Globulin, Total 2.4 1.5 - 4.5 g/dL   Albumin/Globulin Ratio 1.8 1.2 - 2.2   Bilirubin Total 1.8 (H) 0.0 - 1.2 mg/dL   Alkaline Phosphatase 103 44 - 121 IU/L   AST 167 (H) 0 - 40 IU/L   ALT 121 (H) 0 - 44 IU/L  Lipid Panel w/o Chol/HDL Ratio  Result Value Ref Range   Cholesterol, Total 131 100 - 199 mg/dL   Triglycerides 77 0 - 149 mg/dL   HDL 79 >39 mg/dL   VLDL Cholesterol Cal 15 5 - 40 mg/dL   LDL Chol Calc (NIH) 37 0 - 99 mg/dL      Assessment & Plan:   Problem List Items Addressed This Visit       Cardiovascular and Mediastinum   Essential hypertension - Primary    Under good control on current regimen. Continue current regimen. Continue to monitor. Call with any concerns. Refills up to date. Checking labs today.       Relevant Orders   Comprehensive metabolic panel   Other Visit Diagnoses     Elevated liver enzymes       RUQ [place] normal. Will recheck liver enzymes. Call with any concerns. Continue to monitor.   Relevant Orders   Comprehensive metabolic panel  Follow up plan: Return in about 5 months (around 12/29/2022).

## 2022-07-29 NOTE — Assessment & Plan Note (Signed)
Under good control on current regimen. Continue current regimen. Continue to monitor. Call with any concerns. Refills up to date. Checking labs today.

## 2022-07-30 LAB — COMPREHENSIVE METABOLIC PANEL
ALT: 51 IU/L — ABNORMAL HIGH (ref 0–44)
AST: 40 IU/L (ref 0–40)
Albumin/Globulin Ratio: 1.8 (ref 1.2–2.2)
Albumin: 4.2 g/dL (ref 3.8–4.8)
Alkaline Phosphatase: 108 IU/L (ref 44–121)
BUN/Creatinine Ratio: 10 (ref 10–24)
BUN: 13 mg/dL (ref 8–27)
Bilirubin Total: 2.3 mg/dL — ABNORMAL HIGH (ref 0.0–1.2)
CO2: 23 mmol/L (ref 20–29)
Calcium: 9.1 mg/dL (ref 8.6–10.2)
Chloride: 106 mmol/L (ref 96–106)
Creatinine, Ser: 1.25 mg/dL (ref 0.76–1.27)
Globulin, Total: 2.3 g/dL (ref 1.5–4.5)
Glucose: 102 mg/dL — ABNORMAL HIGH (ref 70–99)
Potassium: 3.9 mmol/L (ref 3.5–5.2)
Sodium: 143 mmol/L (ref 134–144)
Total Protein: 6.5 g/dL (ref 6.0–8.5)
eGFR: 61 mL/min/{1.73_m2} (ref >59)

## 2022-08-07 ENCOUNTER — Telehealth: Payer: Self-pay | Admitting: Family Medicine

## 2022-08-07 NOTE — Telephone Encounter (Signed)
Copied from [place] [id number]. Topic: Medicare AWV >> Aug 07, 2022  1:37 PM Devoria Glassing wrote: Reason for CRM: Called patient to schedule Medicare Annual Wellness Visit (AWV). Left message for patient to call back and schedule Medicare Annual Wellness Visit (AWV).  Last date of AWV: 07/25/2021  Please schedule an appointment at any time with Kirke Shaggy, LPN after [phone] on AWV schedule. .  If any questions, please contact me.  Thank you ,  Sherol Dade; [place] Direct Dial: [phone]

## 2022-08-26 ENCOUNTER — Ambulatory Visit: Payer: Medicare Other | Admitting: Podiatry

## 2022-09-16 ENCOUNTER — Other Ambulatory Visit: Payer: Self-pay | Admitting: Family Medicine

## 2022-09-16 DIAGNOSIS — N400 Enlarged prostate without lower urinary tract symptoms: Secondary | ICD-10-CM

## 2022-09-17 ENCOUNTER — Other Ambulatory Visit: Payer: Medicare Other

## 2022-09-17 DIAGNOSIS — N400 Enlarged prostate without lower urinary tract symptoms: Secondary | ICD-10-CM | POA: Diagnosis not present

## 2022-09-18 LAB — PSA: Prostate Specific Ag, Serum: 2.4 ng/mL (ref 0.0–4.0)

## 2022-09-19 ENCOUNTER — Other Ambulatory Visit: Payer: Medicare Other

## 2022-09-22 ENCOUNTER — Telehealth: Payer: Self-pay | Admitting: Family Medicine

## 2022-09-22 NOTE — Telephone Encounter (Signed)
Contacted Zachary Rogers to schedule their annual wellness visit. Appointment made for 09/29/2022.  Verlee Rossetti; Care Guide Ambulatory Clinical Support [place] [facility] Direct Dial: [phone]

## 2022-09-22 NOTE — Progress Notes (Unsigned)
09/23/22 9:57 AM   Zachary Rogers 1950/06/01 [id number]  Referring provider:  Dorcas Carrow, DO [address] [place],  [place] [address] Chief Complaint  Patient presents with   Benign Prostatic Hypertrophy    Urological history:  1. Nephrolithiasis  -spontaneous passage of a 4 mm right UVJ stone in 2019  -KUB (09/2021) bilateral lower pole nephrolithiasis with largest ~ 4 mm in size     2. BPH  -PSA (09/2022) 2.4  HPI: Zachary Rogers is a 72 y.o.male who presents today for a 1 year follow-up.     KUB 5 mm left renal stone  I PSS 3/0  He has a an occasional slow/weak stream, but is not bothersome.  Patient denies any modifying or aggravating factors.  Patient denies any gross hematuria, dysuria or suprapubic/flank pain.  Patient denies any fevers, chills, nausea or vomiting.    He and his wife are not sexually active and this is not bothersome.   IPSS     Row Name 09/23/22 0900         International Prostate Symptom Score   How often have you had the sensation of not emptying your bladder? Not at All     How often have you had to urinate less than every two hours? Less than 1 in 5 times     How often have you found you stopped and started again several times when you urinated? Not at All     How often have you found it difficult to postpone urination? Not at All     How often have you had a weak urinary stream? Less than 1 in 5 times     How often have you had to strain to start urination? Not at All     How many times did you typically get up at night to urinate? 1 Time     Total IPSS Score 3       Quality of Life due to urinary symptoms   If you were to spend the rest of your life with your urinary condition just the way it is now how would you feel about that? Delighted              Score:  1-7 Mild 8-19 Moderate 20-35 Severe    PMH: Past Medical History:  Diagnosis Date   Actinic keratosis    R lat neck - bx proven with verruca vulgaris    Cerebrovascular accident (CVA) (HCC) 10/25/2020   Dysplastic nevus 06/22/2017   L mid back parapinal -severe, excision 10/06/2017   Hypertension    Lateral epicondylitis (tennis elbow) 09/04/2020   Mixed hyperlipidemia 07/18/2019   Nephrolithiasis     Surgical History: Past Surgical History:  Procedure Laterality Date   BUBBLE STUDY  12/17/2020   Procedure: BUBBLE STUDY;  Surgeon: Pricilla Riffle, MD;  [facility]: [facility] ENDOSCOPY;  Service: Cardiovascular;;   CARDIOVERSION N/A 04/30/2021   Procedure: CARDIOVERSION;  Surgeon: Yvonne Kendall, MD;  Location: [facility] ORS;  Service: Cardiovascular;  Laterality: N/A;   CARDIOVERSION N/A 12/24/2021   Procedure: CARDIOVERSION;  Surgeon: Iran Ouch, MD;  Location: [facility] ORS;  Service: Cardiovascular;  Laterality: N/A;   CHOLECYSTECTOMY  06/07/1998   COLONOSCOPY     COLONOSCOPY WITH PROPOFOL N/A 11/09/2018   Procedure: COLONOSCOPY WITH PROPOFOL;  Surgeon: Wyline Mood, MD;  Location: [facility] ENDOSCOPY;  Service: Gastroenterology;  Laterality: N/A;   PATENT FORAMEN OVALE(PFO) CLOSURE N/A 02/13/2021   Procedure: PATENT FORAMEN OVALE (  PFO) CLOSURE;  Surgeon: Tonny Bollman, MD;  Location: [facility] INVASIVE CV LAB;  Service: Cardiovascular;  Laterality: N/A;   SKIN LESION EXCISION     dermatlogy - precancerous lesion 1 year ago on back    TEE WITHOUT CARDIOVERSION N/A 12/17/2020   Procedure: TRANSESOPHAGEAL ECHOCARDIOGRAM (TEE);  Surgeon: Pricilla Riffle, MD;  Location: [facility] ENDOSCOPY;  Service: Cardiovascular;  Laterality: N/A;   TOE SURGERY Bilateral     Home Medications:  Allergies as of 09/23/2022       Reactions   Amoxicillin Rash        Medication List        Accurate as of Sep 23, 2022  9:57 AM. If you have any questions, ask your nurse or doctor.          amLODipine 5 MG tablet Commonly known as: NORVASC Take 1.5 tablets (7.5 mg total) by mouth daily.   apixaban 5 MG Tabs tablet Commonly known as: Eliquis Take 1 tablet (5 mg total) by  mouth 2 (two) times daily.   atorvastatin 80 MG tablet Commonly known as: LIPITOR Take 1 tablet (80 mg total) by mouth daily.   clindamycin 300 MG capsule Commonly known as: Cleocin Take 1 capsule (300 mg total) by mouth 3 (three) times daily.   Fish Oil 1000 MG Caps Take 1,000 mg by mouth daily.   multivitamin capsule Take 1 capsule by mouth daily.        Allergies:  Allergies  Allergen Reactions   Amoxicillin Rash    Family History: Family History  Problem Relation Age of Onset   Lung cancer Mother    Hypertension Father    Diabetes Father    Transient ischemic attack Father    Heart attack Maternal Grandfather    Prostate cancer Neg Hx    Bladder Cancer Neg Hx    Kidney cancer Neg Hx     Social History:  reports that he quit smoking about 24 years ago. His smoking use included cigarettes. He has never used smokeless tobacco. He reports current alcohol use of about 5.0 standard drinks of alcohol per week. He reports that he does not use drugs.   Physical Exam: BP 133/78   Pulse (!) 52   Ht 6\' 3"  (1.905 m)   Wt 270 lb (122.5 kg)   BMI 33.75 kg/m   Constitutional:  Well nourished. Alert and oriented, No acute distress. HEENT: [place] AT, moist mucus membranes.  Trachea midline Cardiovascular: No clubbing, cyanosis, or edema. Respiratory: Normal respiratory effort, no increased work of breathing. Neurologic: Grossly intact, no focal deficits, moving all 4 extremities. Psychiatric: Normal mood and affect.    Laboratory Data: Lab Results  Component Value Date   CREATININE 1.25 07/29/2022   Total cholesterol (06/2022) 131  I have reviewed the labs.   Pertinent Imaging: KUB 5 mm left lower pole stone I have independently reviewed the films.  See HPI.  Radiologist interpretation still pending  Assessment & Plan:    1. Bilateral stones -Small stones in the lower poles of left kidney which are stable -Continue to monitor with yearly KUBs  2. BPH -PSA  stable  Return in about 1 year (around 09/23/2023) for KUB, I PSS.  Cloretta Ned   [facility] [address] [place], [place] [address] [phone]

## 2022-09-23 ENCOUNTER — Ambulatory Visit (INDEPENDENT_AMBULATORY_CARE_PROVIDER_SITE_OTHER): Payer: Medicare Other | Admitting: Urology

## 2022-09-23 ENCOUNTER — Ambulatory Visit
Admission: RE | Admit: 2022-09-23 | Discharge: 2022-09-23 | Disposition: A | Discharge status: Home / Self Care | Payer: Medicare Other | Attending: Urology | Admitting: Urology

## 2022-09-23 ENCOUNTER — Other Ambulatory Visit: Payer: Self-pay | Admitting: Urology

## 2022-09-23 ENCOUNTER — Encounter: Payer: Self-pay | Admitting: Urology

## 2022-09-23 ENCOUNTER — Ambulatory Visit
Admission: RE | Admit: 2022-09-23 | Discharge: 2022-09-23 | Disposition: A | Discharge status: Home / Self Care | Payer: Medicare Other | Source: Ambulatory Visit | Attending: Urology | Admitting: Urology

## 2022-09-23 ENCOUNTER — Other Ambulatory Visit: Payer: Self-pay | Admitting: *Deleted

## 2022-09-23 VITALS — BP 133/78 | HR 52 | Ht 75.0 in | Wt 270.0 lb

## 2022-09-23 DIAGNOSIS — N200 Calculus of kidney: Secondary | ICD-10-CM

## 2022-09-23 DIAGNOSIS — N400 Enlarged prostate without lower urinary tract symptoms: Secondary | ICD-10-CM

## 2022-09-26 ENCOUNTER — Encounter: Payer: Self-pay | Admitting: Podiatry

## 2022-09-26 ENCOUNTER — Ambulatory Visit (INDEPENDENT_AMBULATORY_CARE_PROVIDER_SITE_OTHER): Payer: Medicare Other | Admitting: Podiatry

## 2022-09-26 VITALS — BP 146/76 | HR 48

## 2022-09-26 DIAGNOSIS — L600 Ingrowing nail: Secondary | ICD-10-CM

## 2022-09-26 NOTE — Progress Notes (Signed)
   Chief Complaint  Patient presents with   Nail Problem    "They're doing great.  There's no pain at all."    Subjective: 72 y.o. male presents today status post permanent nail avulsion procedure of the medial and lateral border of the bilateral great toes that was performed on 07/22/2022.  Patient is feeling very well.  He has no pain at all to the toes..   Past Medical History:  Diagnosis Date   Actinic keratosis    R lat neck - bx proven with verruca vulgaris   Cerebrovascular accident (CVA) (HCC) 10/25/2020   Dysplastic nevus 06/22/2017   L mid back parapinal -severe, excision 10/06/2017   Hypertension    Lateral epicondylitis (tennis elbow) 09/04/2020   Mixed hyperlipidemia 07/18/2019   Nephrolithiasis     Objective: Neurovascular status intact.  Skin is warm, dry and supple. Nail and respective nail fold appears to be healing appropriately.   Assessment: #1 s/p partial permanent nail matrixectomy MED and LAT B/L great toes   Plan of care: #1 patient was evaluated  #2 light debridement of the periungual debris was performed to the border of the respective toe and nail plate using a tissue nipper. #3 patient is to return to clinic on a PRN basis.   Felecia Shelling, DPM [facility]  Dr. Felecia Shelling, DPM    [address] [place], [place] [address]                Office [phone]  Fax [phone]

## 2022-09-29 ENCOUNTER — Ambulatory Visit (INDEPENDENT_AMBULATORY_CARE_PROVIDER_SITE_OTHER): Payer: Medicare Other

## 2022-09-29 VITALS — Ht 75.0 in | Wt 270.0 lb

## 2022-09-29 DIAGNOSIS — Z0000 Encounter for general adult medical examination without abnormal findings: Secondary | ICD-10-CM

## 2022-09-29 NOTE — Patient Instructions (Signed)
[name] , Thank you for taking time to come for your Medicare Wellness Visit. I appreciate your ongoing commitment to your health goals. Please review the following plan we discussed and let me know if I can assist you in the future.   These are the goals we discussed:  Goals       DIET - EAT MORE FRUITS AND VEGETABLES      Exercise 3x per week (30 min per time) (pt-stated)      Walk every day for approx .       Exercise 3x per week (30 min per time)      Golf more, and use workout machines at home couple times a week.       Patient Stated      07/23/2020, wants to weigh between 240-250 pounds      Patient Stated      Continue walking and current lifestyle        This is a list of the screening recommended for you and due dates:  Health Maintenance  Topic Date Due   Zoster (Shingles) Vaccine (1 of 2) Never done   COVID-19 Vaccine (5 - 2023-24 season) 01/17/2022   Flu Shot  12/18/2022   Medicare Annual Wellness Visit  09/29/2023   Colon Cancer Screening  11/09/2023   DTaP/Tdap/Td vaccine (2 - Td or Tdap) 06/19/2024   Pneumonia Vaccine  Completed   Hepatitis C Screening: USPSTF Recommendation to screen - Ages 64-79 yo.  Completed   HPV Vaccine  Aged Out    Advanced directives: no  Conditions/risks identified: none  Next appointment: Follow up in one year for your annual wellness visit. 10/05/23 @ 1:00 pm by phone  Preventive Care 65 Years and Older, Male  Preventive care refers to lifestyle choices and visits with your health care provider that can promote health and wellness. What does preventive care include? A yearly physical exam. This is also called an annual well check. Dental exams once or twice a year. Routine eye exams. Ask your health care provider how often you should have your eyes checked. Personal lifestyle choices, including: Daily care of your teeth and gums. Regular physical activity. Eating a healthy diet. Avoiding tobacco and drug  use. Limiting alcohol use. Practicing safe sex. Taking low doses of aspirin every day. Taking vitamin and mineral supplements as recommended by your health care provider. What happens during an annual well check? The services and screenings done by your health care provider during your annual well check will depend on your age, overall health, lifestyle risk factors, and family history of disease. Counseling  Your health care provider may ask you questions about your: Alcohol use. Tobacco use. Drug use. Emotional well-being. Home and relationship well-being. Sexual activity. Eating habits. History of falls. Memory and ability to understand (cognition). Work and work Astronomer. Screening  You may have the following tests or measurements: Height, weight, and BMI. Blood pressure. Lipid and cholesterol levels. These may be checked every 5 years, or more frequently if you are over 67 years old. Skin check. Lung cancer screening. You may have this screening every year starting at age 4 if you have a 30-pack-year history of smoking and currently smoke or have quit within the past 15 years. Fecal occult blood test (FOBT) of the stool. You may have this test every year starting at age 57. Flexible sigmoidoscopy or colonoscopy. You may have a sigmoidoscopy every 5 years or a colonoscopy every 10 years starting at  age 63. Prostate cancer screening. Recommendations will vary depending on your family history and other risks. Hepatitis C blood test. Hepatitis B blood test. Sexually transmitted disease (STD) testing. Diabetes screening. This is done by checking your blood sugar (glucose) after you have not eaten for a while (fasting). You may have this done every 1-3 years. Abdominal aortic aneurysm (AAA) screening. You may need this if you are a current or former smoker. Osteoporosis. You may be screened starting at age 33 if you are at high risk. Talk with your health care provider about  your test results, treatment options, and if necessary, the need for more tests. Vaccines  Your health care provider may recommend certain vaccines, such as: Influenza vaccine. This is recommended every year. Tetanus, diphtheria, and acellular pertussis (Tdap, Td) vaccine. You may need a Td booster every 10 years. Zoster vaccine. You may need this after age 34. Pneumococcal 13-valent conjugate (PCV13) vaccine. One dose is recommended after age 79. Pneumococcal polysaccharide (PPSV23) vaccine. One dose is recommended after age 15. Talk to your health care provider about which screenings and vaccines you need and how often you need them. This information is not intended to replace advice given to you by your health care provider. Make sure you discuss any questions you have with your health care provider. Document Released: 06/01/2015 Document Revised: 01/23/2016 Document Reviewed: 03/06/2015 Elsevier Interactive Patient Education  2017 [employer].  Fall Prevention in the Home Falls can cause injuries. They can happen to people of all ages. There are many things you can do to make your home safe and to help prevent falls. What can I do on the outside of my home? Regularly fix the edges of walkways and driveways and fix any cracks. Remove anything that might make you trip as you walk through a door, such as a raised step or threshold. Trim any bushes or trees on the path to your home. Use bright outdoor lighting. Clear any walking paths of anything that might make someone trip, such as rocks or tools. Regularly check to see if handrails are loose or broken. Make sure that both sides of any steps have handrails. Any raised decks and porches should have guardrails on the edges. Have any leaves, snow, or ice cleared regularly. Use sand or salt on walking paths during winter. Clean up any spills in your garage right away. This includes oil or grease spills. What can I do in the bathroom? Use  night lights. Install grab bars by the toilet and in the tub and shower. Do not use towel bars as grab bars. Use non-skid mats or decals in the tub or shower. If you need to sit down in the shower, use a plastic, non-slip stool. Keep the floor dry. Clean up any water that spills on the floor as soon as it happens. Remove soap buildup in the tub or shower regularly. Attach bath mats securely with double-sided non-slip rug tape. Do not have throw rugs and other things on the floor that can make you trip. What can I do in the bedroom? Use night lights. Make sure that you have a light by your bed that is easy to reach. Do not use any sheets or blankets that are too big for your bed. They should not hang down onto the floor. Have a firm chair that has side arms. You can use this for support while you get dressed. Do not have throw rugs and other things on the floor that can make  you trip. What can I do in the kitchen? Clean up any spills right away. Avoid walking on wet floors. Keep items that you use a lot in easy-to-reach places. If you need to reach something above you, use a strong step stool that has a grab bar. Keep electrical cords out of the way. Do not use floor polish or wax that makes floors slippery. If you must use wax, use non-skid floor wax. Do not have throw rugs and other things on the floor that can make you trip. What can I do with my stairs? Do not leave any items on the stairs. Make sure that there are handrails on both sides of the stairs and use them. Fix handrails that are broken or loose. Make sure that handrails are as long as the stairways. Check any carpeting to make sure that it is firmly attached to the stairs. Fix any carpet that is loose or worn. Avoid having throw rugs at the top or bottom of the stairs. If you do have throw rugs, attach them to the floor with carpet tape. Make sure that you have a light switch at the top of the stairs and the bottom of the  stairs. If you do not have them, ask someone to add them for you. What else can I do to help prevent falls? Wear shoes that: Do not have high heels. Have rubber bottoms. Are comfortable and fit you well. Are closed at the toe. Do not wear sandals. If you use a stepladder: Make sure that it is fully opened. Do not climb a closed stepladder. Make sure that both sides of the stepladder are locked into place. Ask someone to hold it for you, if possible. Clearly mark and make sure that you can see: Any grab bars or handrails. First and last steps. Where the edge of each step is. Use tools that help you move around (mobility aids) if they are needed. These include: Canes. Walkers. Scooters. Crutches. Turn on the lights when you go into a dark area. Replace any light bulbs as soon as they burn out. Set up your furniture so you have a clear path. Avoid moving your furniture around. If any of your floors are uneven, fix them. If there are any pets around you, be aware of where they are. Review your medicines with your doctor. Some medicines can make you feel dizzy. This can increase your chance of falling. Ask your doctor what other things that you can do to help prevent falls. This information is not intended to replace advice given to you by your health care provider. Make sure you discuss any questions you have with your health care provider. Document Released: 03/01/2009 Document Revised: 10/11/2015 Document Reviewed: 06/09/2014 Elsevier Interactive Patient Education  2017 [employer].

## 2022-09-29 NOTE — Progress Notes (Signed)
I connected with  [name] on 09/29/22 by a audio enabled telemedicine application and verified that I am speaking with the correct person using two identifiers.  Patient Location: Home  Provider Location: Office/Clinic  I discussed the limitations of evaluation and management by telemedicine. The patient expressed understanding and agreed to proceed.  Subjective:   Zachary Rogers is a 72 y.o. male who presents for Medicare Annual/Subsequent preventive examination.  Review of Systems     Cardiac Risk Factors include: advanced age (>24men, >77 women);male gender;hypertension;obesity (BMI >30kg/m2)     Objective:    Today's Vitals   09/29/22 1302  PainSc: 3    There is no height or weight on file to calculate BMI.     09/29/2022    1:06 PM 02/13/2021    7:21 AM 12/17/2020    8:08 AM 07/23/2020    9:03 AM 07/18/2019    9:07 AM 11/09/2018    9:07 AM 07/12/2018   10:15 AM  Advanced Directives  Does Patient Have a Medical Advance Directive? No No Yes Yes Yes Yes Yes  Type of Chief of Staff of [place];Living will Living will;Healthcare Power of [employer] Power of [place];Living will;Out of facility DNR (pink MOST or yellow form) Living will;Healthcare Power of [employer] of Healthcare Power of Attorney in Chart?   No - copy requested No - copy requested No - copy requested No - copy requested No - copy requested  Would patient like information on creating a medical advance directive? No - Patient declined Yes (MAU/Ambulatory/Procedural Areas - Information given)         Current Medications (verified) Outpatient Encounter Medications as of 09/29/2022  Medication Sig   amLODipine (NORVASC) 5 MG tablet Take 1.5 tablets (7.5 mg total) by mouth daily.   apixaban (ELIQUIS) 5 MG TABS tablet Take 1 tablet (5 mg total) by mouth 2 (two) times daily.   atorvastatin (LIPITOR) 80 MG tablet Take 1 tablet (80 mg total) by  mouth daily.   Multiple Vitamin (MULTIVITAMIN) capsule Take 1 capsule by mouth daily.   Omega-3 Fatty Acids (FISH OIL) 1000 MG CAPS Take 1,000 mg by mouth daily.   [DISCONTINUED] clindamycin (CLEOCIN) 300 MG capsule Take 1 capsule (300 mg total) by mouth 3 (three) times daily. (Patient not taking: Reported on 09/26/2022)   No facility-administered encounter medications on file as of 09/29/2022.    Allergies (verified) Amoxicillin   History: Past Medical History:  Diagnosis Date   Actinic keratosis    R lat neck - bx proven with verruca vulgaris   Cerebrovascular accident (CVA) (HCC) 10/25/2020   Dysplastic nevus 06/22/2017   L mid back parapinal -severe, excision 10/06/2017   Hypertension    Lateral epicondylitis (tennis elbow) 09/04/2020   Mixed hyperlipidemia 07/18/2019   Nephrolithiasis    Past Surgical History:  Procedure Laterality Date   BUBBLE STUDY  12/17/2020   Procedure: BUBBLE STUDY;  Surgeon: Pricilla Riffle, MD;  Location: [facility] ENDOSCOPY;  Service: Cardiovascular;;   CARDIOVERSION N/A 04/30/2021   Procedure: CARDIOVERSION;  Surgeon: Yvonne Kendall, MD;  Location: [facility] ORS;  Service: Cardiovascular;  Laterality: N/A;   CARDIOVERSION N/A 12/24/2021   Procedure: CARDIOVERSION;  Surgeon: Iran Ouch, MD;  Location: [facility] ORS;  Service: Cardiovascular;  Laterality: N/A;   CHOLECYSTECTOMY  06/07/1998   COLONOSCOPY     COLONOSCOPY WITH PROPOFOL N/A 11/09/2018   Procedure: COLONOSCOPY WITH PROPOFOL;  Surgeon: Wyline Mood,  MD;  Location: [facility] ENDOSCOPY;  Service: Gastroenterology;  Laterality: N/A;   PATENT FORAMEN OVALE(PFO) CLOSURE N/A 02/13/2021   Procedure: PATENT FORAMEN OVALE (PFO) CLOSURE;  Surgeon: Tonny Bollman, MD;  Location: [facility] INVASIVE CV LAB;  Service: Cardiovascular;  Laterality: N/A;   SKIN LESION EXCISION     dermatlogy - precancerous lesion 1 year ago on back    TEE WITHOUT CARDIOVERSION N/A 12/17/2020   Procedure: TRANSESOPHAGEAL ECHOCARDIOGRAM (TEE);   Surgeon: Pricilla Riffle, MD;  Location: [facility] ENDOSCOPY;  Service: Cardiovascular;  Laterality: N/A;   TOE SURGERY Bilateral    Family History  Problem Relation Age of Onset   Lung cancer Mother    Hypertension Father    Diabetes Father    Transient ischemic attack Father    Heart attack Maternal Grandfather    Prostate cancer Neg Hx    Bladder Cancer Neg Hx    Kidney cancer Neg Hx    Social History   Socioeconomic History   Marital status: Married    Spouse name: Not on file   Number of children: Not on file   Years of education: Not on file   Highest education level: Master's degree (e.g., MA, MS, MEng, MEd, MSW, MBA)  Occupational History   Occupation: working fulltime   Tobacco Use   Smoking status: Some Days    Packs/day: 0.00    Years: 0.00    Additional pack years: 0.00    Total pack years: 0.00    Types: Cigars, Cigarettes    Last attempt to quit: 2000    Years since quitting: 24.3   Smokeless tobacco: Never   Tobacco comments:    social smoker when younger, I have a cigar every now and then.  Vaping Use   Vaping Use: Never used  Substance and Sexual Activity   Alcohol use: Yes    Alcohol/week: 5.0 standard drinks of alcohol    Types: 5 Glasses of wine per week    Comment: 4-5 drinks a week   Drug use: No   Sexual activity: Yes    Birth control/protection: Surgical  Other Topics Concern   Not on file  Social History Licensed conveyancer, local friends.    Social Determinants of Health   Financial Resource Strain: Low Risk  (09/29/2022)   Overall Financial Resource Strain (CARDIA)    Difficulty of Paying Living Expenses: Not hard at all  Food Insecurity: No Food Insecurity (09/29/2022)   Hunger Vital Sign    Worried About Running Out of Food in the Last Year: Never true    Ran Out of Food in the Last Year: Never true  Transportation Needs: No Transportation Needs (09/29/2022)   PRAPARE - Administrator, Civil Service (Medical): No     Lack of Transportation (Non-Medical): No  Physical Activity: Sufficiently Active (09/29/2022)   Exercise Vital Sign    Days of Exercise per Week: 5 days    Minutes of Exercise per Session: 60 min  Stress: No Stress Concern Present (09/29/2022)   [employer] of Occupational Health - Occupational Stress Questionnaire    Feeling of Stress : Only a little  Social Connections: Moderately Isolated (09/29/2022)   Social Connection and Isolation Panel [NHANES]    Frequency of Communication with Friends and Family: More than three times a week    Frequency of Social Gatherings with Friends and Family: Once a week    Attends Religious Services: Never    Production manager of [employer]  or Organizations: No    Attends Banker Meetings: Never    Marital Status: Married    Tobacco Counseling Ready to quit: Not Answered Counseling given: Not Answered Tobacco comments: social smoker when younger, I have a cigar every now and then.   Clinical Intake:  Pre-visit preparation completed: Yes  Pain : 0-10 Pain Score: 3  Pain Type: Chronic pain Pain Location: Hand Pain Orientation: Right, Left Pain Radiating Towards: arthritis     Nutritional Risks: None Diabetes: No  How often do you need to have someone help you when you read instructions, pamphlets, or other written materials from your doctor or pharmacy?: 1 - Never  Diabetic?no  Interpreter Needed?: No  Information entered by :: Kennedy Bucker, LPN   Activities of Daily Living    09/29/2022    1:09 PM 09/25/2022    9:03 AM  In your present state of health, do you have any difficulty performing the following activities:  Hearing? 0 0  Vision? 0 0  Difficulty concentrating or making decisions? 0 0  Walking or climbing stairs? 0 0  Dressing or bathing? 0 0  Doing errands, shopping? 0 0  Preparing Food and eating ? N N  Using the Toilet? N N  In the past six months, have you accidently leaked urine? N N  Do you have  problems with loss of bowel control? N N  Managing your Medications? N N  Managing your Finances? N N  Housekeeping or managing your Housekeeping? N N    Patient Care Team: Dorcas Carrow, DO as PCP - General (Family Medicine) End, Cristal Deer, MD as PCP - Cardiology (Cardiology) Hulan Fray as Physician Assistant (Urology) Deirdre Evener, MD (Dermatology)  Indicate any recent Medical Services you may have received from other than [facility] providers in the past year (date may be approximate).     Assessment:   This is a routine wellness examination for [place].  Hearing/Vision screen Hearing Screening - Comments:: No aids Vision Screening - Comments:: Readers- [facility]  Dietary issues and exercise activities discussed: Current Exercise Habits: Home exercise routine, Type of exercise: walking;strength training/weights, Time (Minutes): 60, Frequency (Times/Week): 5, Weekly Exercise (Minutes/Week): 300, Intensity: Mild   Goals Addressed             This Visit's Progress    DIET - EAT MORE FRUITS AND VEGETABLES         Depression Screen    09/29/2022    1:05 PM 07/29/2022    8:26 AM 06/30/2022    8:26 AM 12/26/2021    8:50 AM 07/25/2021    9:20 AM 06/28/2021    8:46 AM 07/23/2020    9:04 AM  PHQ 2/9 Scores  PHQ - 2 Score 0 0 0 0 0 0 0  PHQ- 9 Score 0 0 0 0 0 0     Fall Risk    09/29/2022    1:09 PM 09/25/2022    9:03 AM 07/29/2022    8:26 AM 06/30/2022    8:25 AM 07/25/2021    9:11 AM  Fall Risk   Falls in the past year? 0 0 0 0 0  Number falls in past yr: 0 0 0 0 0  Injury with Fall? 0 0 0 0 0  Risk for fall due to : No Fall Risks  No Fall Risks No Fall Risks   Follow up Falls prevention discussed;Falls evaluation completed  Falls evaluation completed Falls evaluation completed Falls  evaluation completed;Falls prevention discussed    FALL RISK PREVENTION PERTAINING TO THE HOME:  Any stairs in or around the home? Yes  If so, are there any without  handrails? No  Home free of loose throw rugs in walkways, pet beds, electrical cords, etc? Yes  Adequate lighting in your home to reduce risk of falls? Yes   ASSISTIVE DEVICES UTILIZED TO PREVENT FALLS:  Life alert? No  Use of a cane, walker or w/c? No  Grab bars in the bathroom? No  Shower chair or bench in shower? Yes  Elevated toilet seat or a handicapped toilet? Yes    Cognitive Function:        09/29/2022    1:12 PM 07/23/2020    9:06 AM 07/18/2019    9:08 AM 07/12/2018   10:16 AM 06/19/2017    1:10 PM  6CIT Screen  What Year? 0 points 0 points 0 points 0 points 0 points  What month? 0 points 0 points 0 points 0 points 0 points  What time? 0 points 0 points 0 points 0 points 0 points  Count back from 20 0 points 0 points 0 points 0 points 0 points  Months in reverse 0 points 0 points 0 points 0 points 0 points  Repeat phrase 0 points 2 points 4 points 0 points 4 points  Total Score 0 points 2 points 4 points 0 points 4 points    Immunizations Immunization History  Administered Date(s) Administered   Fluad Quad(high Dose 65+) 02/13/2020   Influenza, High Dose Seasonal PF 05/06/2017, 04/07/2018, 03/15/2019   Influenza,inj,Quad PF,6+ Mos 06/19/2014, 03/27/2015   Influenza-Unspecified 05/06/2017, 03/15/2019, 03/25/2021   PFIZER(Purple Top)SARS-COV-2 Vaccination 06/11/2019, 07/07/2019, 02/27/2020, 09/28/2020   Pneumococcal Conjugate-13 06/19/2014   Pneumococcal Polysaccharide-23 06/20/2015, 07/23/2015   Tdap 06/19/2014   Zoster, Live 06/19/2014    TDAP status: Up to date  Flu Vaccine status: Declined, Education has been provided regarding the importance of this vaccine but patient still declined. Advised may receive this vaccine at local pharmacy or Health Dept. Aware to provide a copy of the vaccination record if obtained from local pharmacy or Health Dept. Verbalized acceptance and understanding.  Pneumococcal vaccine status: Up to date  Covid-19 vaccine status:  Completed vaccines  Qualifies for Shingles Vaccine? Yes   Zostavax completed Yes   Shingrix Completed?: No.    Education has been provided regarding the importance of this vaccine. Patient has been advised to call insurance company to determine out of pocket expense if they have not yet received this vaccine. Advised may also receive vaccine at local pharmacy or Health Dept. Verbalized acceptance and understanding.  Screening Tests Health Maintenance  Topic Date Due   Zoster Vaccines- Shingrix (1 of 2) Never done   COVID-19 Vaccine (5 - 2023-24 season) 01/17/2022   INFLUENZA VACCINE  12/18/2022   Medicare Annual Wellness (AWV)  09/29/2023   COLONOSCOPY (Pts 45-49yrs Insurance coverage will need to be confirmed)  11/09/2023   DTaP/Tdap/Td (2 - Td or Tdap) 06/19/2024   Pneumonia Vaccine 45+ Years old  Completed   Hepatitis C Screening  Completed   HPV VACCINES  Aged Out    Health Maintenance  Health Maintenance Due  Topic Date Due   Zoster Vaccines- Shingrix (1 of 2) Never done   COVID-19 Vaccine (5 - 2023-24 season) 01/17/2022    Colorectal cancer screening: Type of screening: Colonoscopy. Completed 11/09/18. Repeat every 5 years  Lung Cancer Screening: (Low Dose CT Chest recommended if Age 58-80 years,  30 pack-year currently smoking OR have quit w/in 15years.) does not qualify.    Additional Screening:  Hepatitis C Screening: does qualify; Completed 06/02/17  Vision Screening: Recommended annual ophthalmology exams for early detection of glaucoma and other disorders of the eye. Is the patient up to date with their annual eye exam?  Yes  Who is the provider or what is the name of the office in which the patient attends annual eye exams? [facility] If pt is not established with a provider, would they like to be referred to a provider to establish care? No .   Dental Screening: Recommended annual dental exams for proper oral hygiene  Community Resource Referral / Chronic Care  Management: CRR required this visit?  No   CCM required this visit?  No      Plan:     I have personally reviewed and noted the following in the patient's chart:   Medical and social history Use of alcohol, tobacco or illicit drugs  Current medications and supplements including opioid prescriptions. Patient is not currently taking opioid prescriptions. Functional ability and status Nutritional status Physical activity Advanced directives List of other physicians Hospitalizations, surgeries, and ER visits in previous 12 months Vitals Screenings to include cognitive, depression, and falls Referrals and appointments  In addition, I have reviewed and discussed with patient certain preventive protocols, quality metrics, and best practice recommendations. A written personalized care plan for preventive services as well as general preventive health recommendations were provided to patient.     [name] Hope, LPN   1/61/0960   Nurse Notes: none

## 2022-10-30 IMAGING — CR DG ABDOMEN 1V
2 series · 2 of 2 positions shown · non-contrast
Comparison: Abdominal radiograph 09/05/2020

CLINICAL DATA: Flank pain.  Prior nephrolithiasis.

EXAM:
ABDOMEN - 1 VIEW

[abdomen kub (1 of 2)]
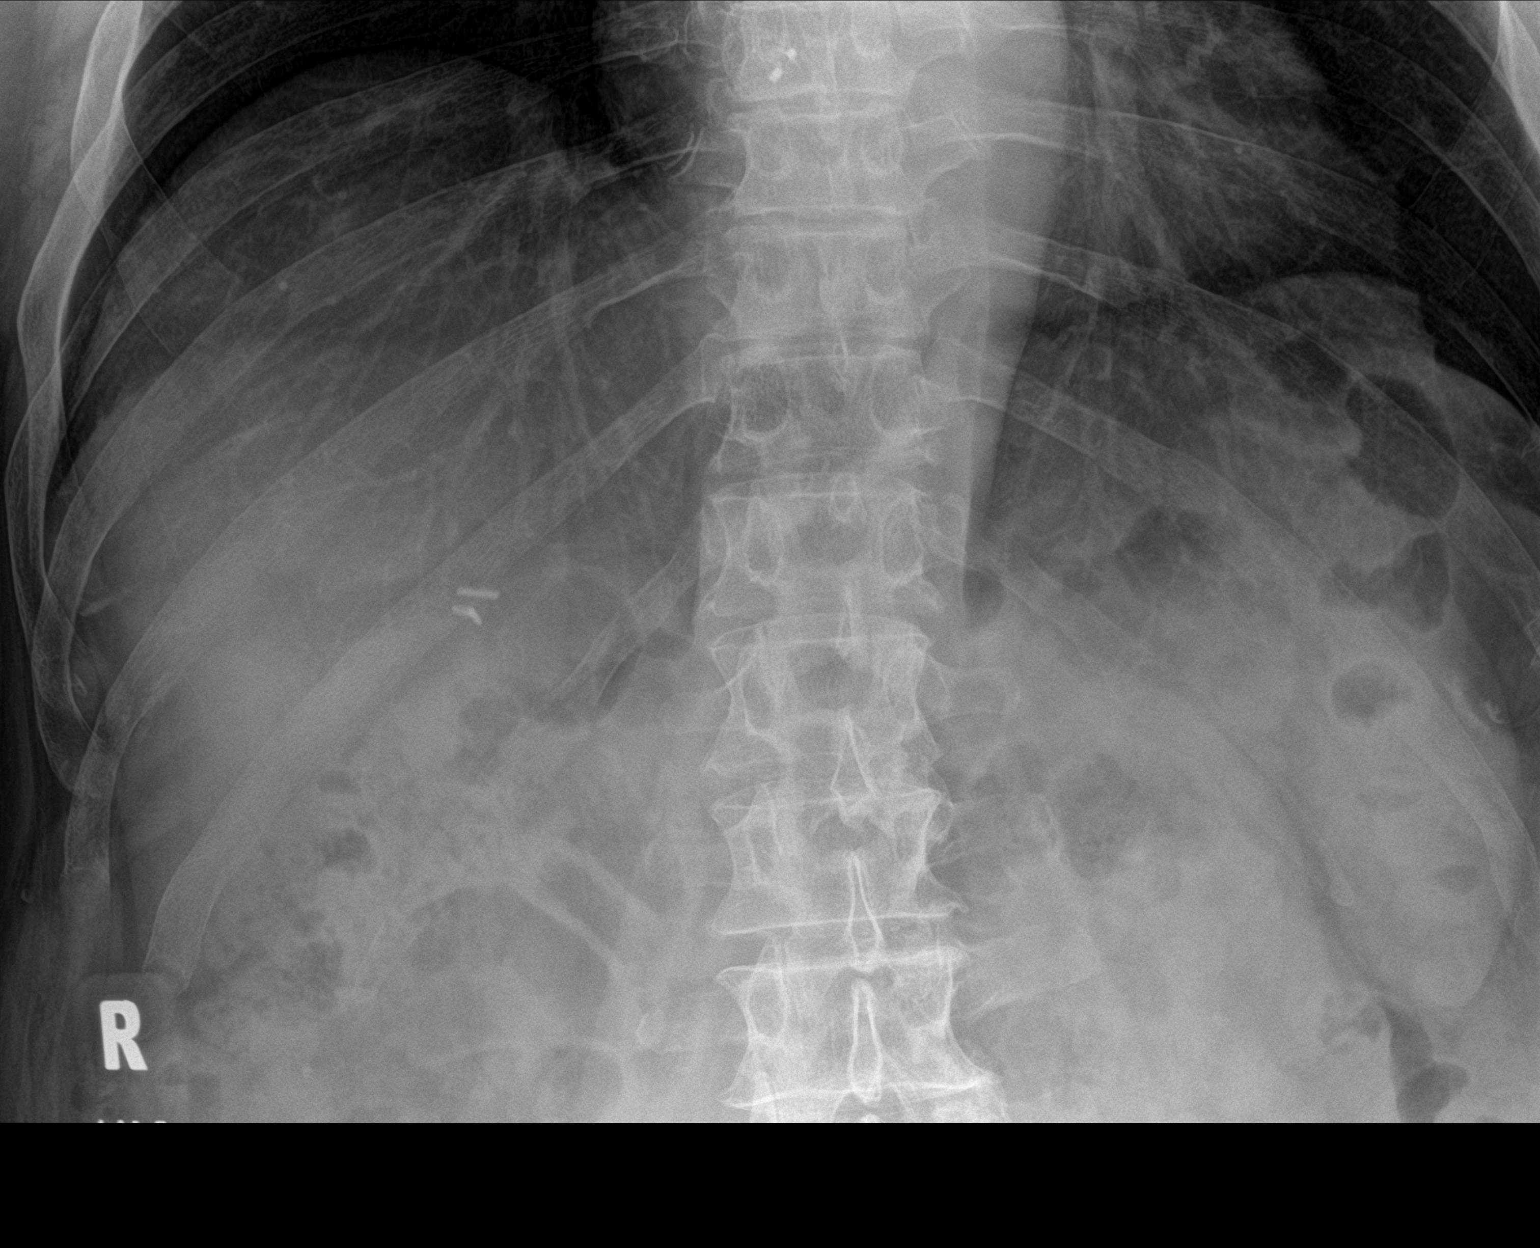

[abdomen kub (2 of 2)]
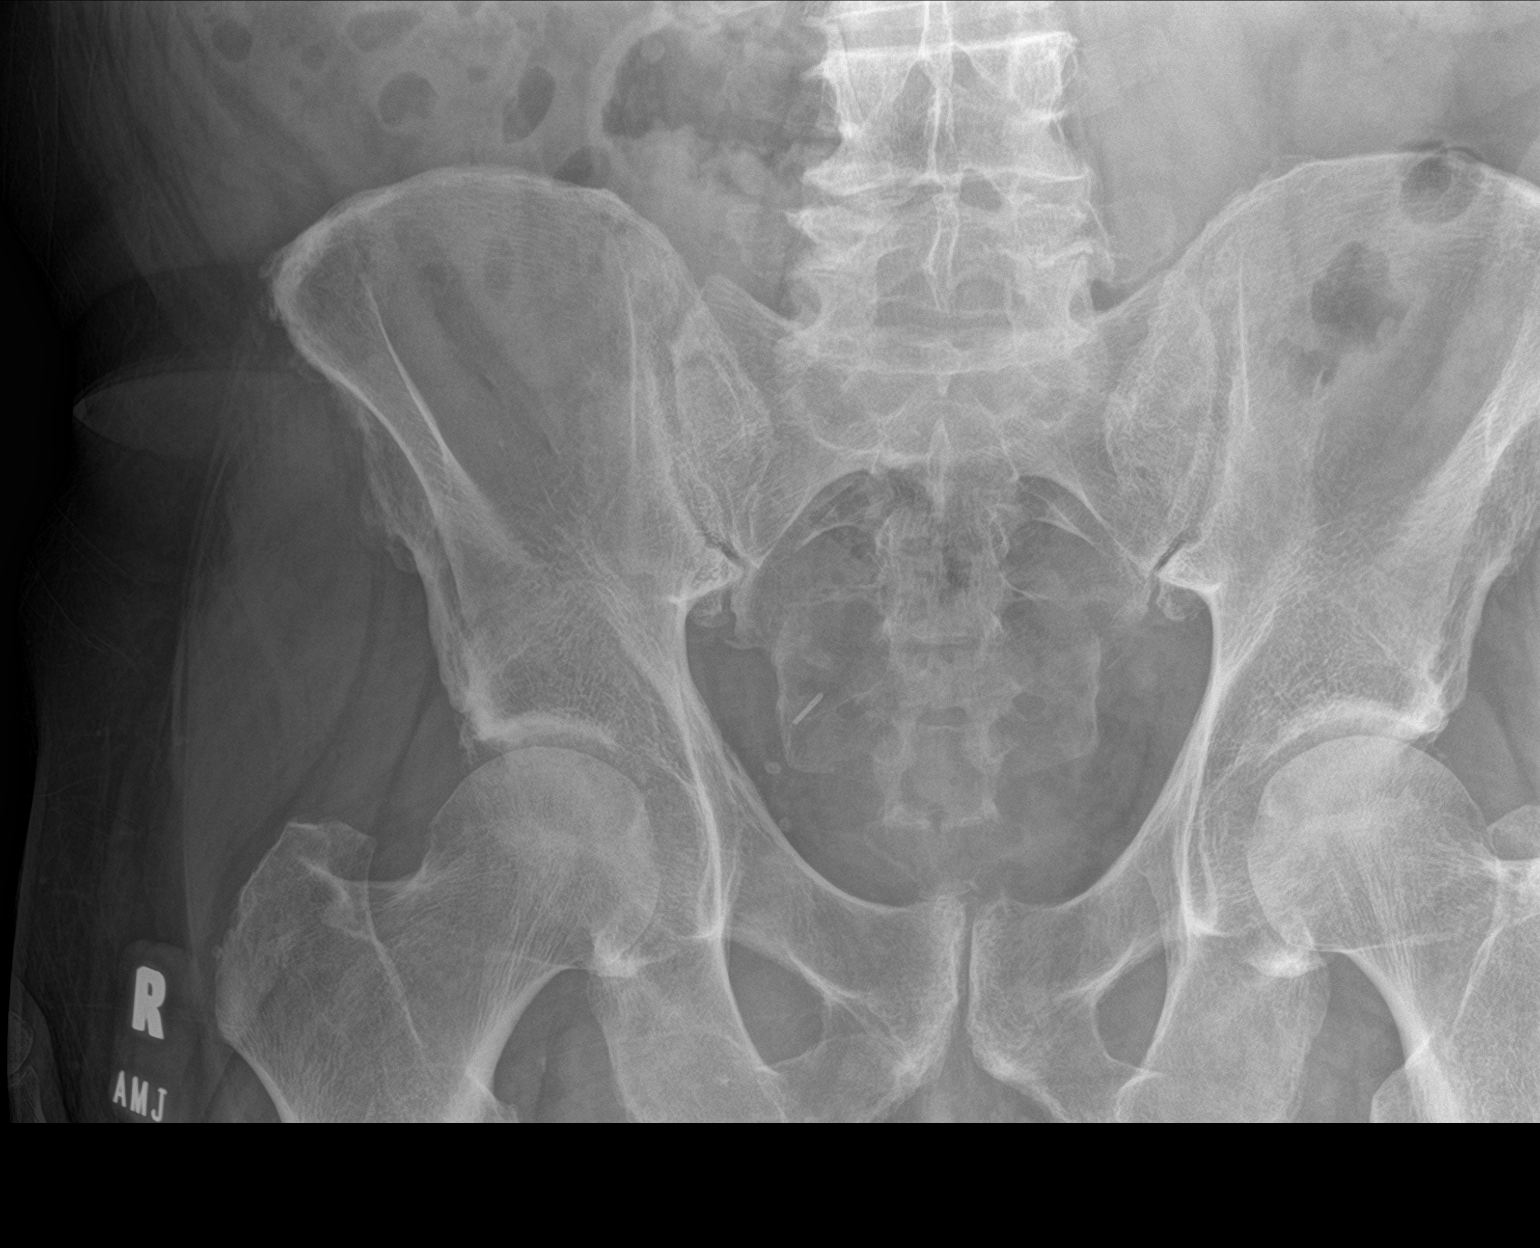

[2 of 2 positions shown; findings below may reference images not displayed]

FINDINGS: Motion on the imaging limits evaluation for renal stones however
there is suggestion of similar-appearing 5 mm stone within the
inferior right and inferior left kidney. Pelvic surgical clips.
Pelvic phleboliths. Lung bases are clear. Lumbar spine degenerative
changes.
IMPRESSION: Motion artifact limits evaluation. Suggestion of small stones within
the inferior right and inferior left kidneys.

## 2022-11-05 ENCOUNTER — Ambulatory Visit: Payer: Medicare Other | Attending: Internal Medicine | Admitting: Internal Medicine

## 2022-11-05 ENCOUNTER — Encounter: Payer: Self-pay | Admitting: Internal Medicine

## 2022-11-05 VITALS — BP 130/74 | HR 57 | Ht 75.0 in | Wt 271.0 lb

## 2022-11-05 DIAGNOSIS — E785 Hyperlipidemia, unspecified: Secondary | ICD-10-CM | POA: Diagnosis not present

## 2022-11-05 DIAGNOSIS — I4819 Other persistent atrial fibrillation: Secondary | ICD-10-CM

## 2022-11-05 DIAGNOSIS — Q2112 Patent foramen ovale: Secondary | ICD-10-CM | POA: Diagnosis not present

## 2022-11-05 DIAGNOSIS — I10 Essential (primary) hypertension: Secondary | ICD-10-CM | POA: Diagnosis not present

## 2022-11-05 DIAGNOSIS — I2510 Atherosclerotic heart disease of native coronary artery without angina pectoris: Secondary | ICD-10-CM

## 2022-11-05 NOTE — Progress Notes (Unsigned)
Follow-up Outpatient Visit Date: 11/05/2022  Primary Care Provider: Dorcas Carrow, DO [address] [place] [address]  Chief Complaint: Follow-up atrial fibrillation  HPI:  [name] is a 72 y.o. male with history of stroke in the setting of PFO status post percutaneous closure and subsequent development of paroxysmal atrial fibrillation, hypertension, hyperlipidemia, and nephrolithiasis, who presents for follow-up of atrial fibrillation.  I last saw him in December, at which time [name] was feeling well, though on further questioning he endorsed decreased exercise tolerance and exertional dyspnea over a few years.  We agreed to perform an exercise MPI, which was normal without evidence of ischemia or scar; hypertensive blood pressure response was noted during exercise.  Today, [name] reports that he has been feeling fairly well without recent palpitations nor chest pain or shortness of breath.  He notes occasional dizziness when turning his head certain ways, consistent with vertigo.  He has not been exercising recently as he is still in the process of doing paperwork related to his recent retirement.  He notes that amlodipine was increased by Dr. Laural Benes at a prior visit and that his blood pressure control has improved.  He remains on apixaban without bleeding.  --------------------------------------------------------------------------------------------------  Cardiovascular History & Procedures: Cardiovascular Problems: Paroxysmal atrial fibrillation Stroke Patent foramen ovale   Risk Factors: Hypertension, hyperlipidemia, male gender, tobacco use, obesity, and age greater than 78   Cath/PCI: None   CV Surgery: PFO closure (02/13/2021): 18 mm Amplatz atrial septal occluder   EP Procedures and Devices: DCCV (04/30/2021, 12/24/2021) 14-day event monitor (11/14/2020): Predominantly sinus rhythm with occasional PACs and rare PVCs as well as several brief runs of PSVT.  At least  one episode of idioventricular rhythm was also noted.  No atrial fibrillation/flutter observed.   Non-Invasive Evaluation(s): Exercise MPI (05/15/2022): Low risk study without evidence of ischemia or scar.  Hypertensive blood pressure response noted.  Normal LVEF (62%).  Mild coronary artery calcifications noted Limited TTE (02/13/2021): Amplatz closure device in place with no residual PFO.  Mild right atrial enlargement.  Mild mitral annular calcification and mitral valve thickening.  Trivial tricuspid regurgitation.  Aortic sclerosis. TEE (12/17/2020): Normal LV size.  LVEF 60-65%.  Normal RV size and function.  Normal biatrial size without thrombus.  Normal mitral valve with mild regurgitation.  Normal tricuspid valve with trivial regurgitation.  Tricuspid aortic valve with trivial regurgitation.  Normal pulmonic valve with mild regurgitation.  Minimal plaque in the thoracic aorta.  Atrial septal aneurysm with large PFO and positive bubble study noted. Carotid Doppler (11/13/2020): Mild right ICA stenosis.  Normal left carotid artery.  Atypical antegrade flow in both vertebral arteries.  Flow disturbance in right subclavian artery noted. TTE (11/06/2020): Mildly dilated LV with normal wall thickness.  Normal LVEF (50-55%) with normal wall motion.  Indeterminate diastolic parameters.  Normal RV size and function.  Normal biatrial size.  Trivial mitral and tricuspid regurgitation.  Positive bubble study.  Recent CV Pertinent Labs: Lab Results  Component Value Date   CHOL 131 06/30/2022   HDL 79 06/30/2022   LDLCALC 37 06/30/2022   TRIG 77 06/30/2022   K 3.9 07/29/2022   MG 2.1 12/12/2021   BUN 13 07/29/2022   CREATININE 1.25 07/29/2022    Past medical and surgical history were reviewed and updated in EPIC.  Current Meds  Medication Sig   amLODipine (NORVASC) 5 MG tablet Take 1.5 tablets (7.5 mg total) by mouth daily.   apixaban (ELIQUIS)  5 MG TABS tablet Take 1 tablet (5 mg total) by mouth 2  (two) times daily.   atorvastatin (LIPITOR) 80 MG tablet Take 1 tablet (80 mg total) by mouth daily.   Multiple Vitamin (MULTIVITAMIN) capsule Take 1 capsule by mouth daily.   Omega-3 Fatty Acids (FISH OIL) 1000 MG CAPS Take 1,000 mg by mouth daily.    Allergies: Amoxicillin  Social History   Tobacco Use   Smoking status: Some Days    Packs/day: 0.00    Years: 0.00    Additional pack years: 0.00    Total pack years: 0.00    Types: Cigars, Cigarettes    Last attempt to quit: 2000    Years since quitting: 24.4   Smokeless tobacco: Never   Tobacco comments:    social smoker when younger, I have a cigar every now and then.  Vaping Use   Vaping Use: Never used  Substance Use Topics   Alcohol use: Yes    Alcohol/week: 5.0 standard drinks of alcohol    Types: 5 Glasses of wine per week    Comment: 4-5 drinks a week   Drug use: No    Family History  Problem Relation Age of Onset   Lung cancer Mother    Hypertension Father    Diabetes Father    Transient ischemic attack Father    Heart attack Maternal Grandfather    Prostate cancer Neg Hx    Bladder Cancer Neg Hx    Kidney cancer Neg Hx     Review of Systems: A 12-system review of systems was performed and was negative except as noted in the HPI.  --------------------------------------------------------------------------------------------------  Physical Exam: BP 130/74 (BP Location: Left Arm, Patient Position: Sitting, Cuff Size: Large)   Pulse (!) 57   Ht 6\' 3"  (1.905 m)   Wt 271 lb (122.9 kg)   SpO2 98%   BMI 33.87 kg/m   General:  NAD. Neck: No JVD or HJR. Lungs: Clear to auscultation bilaterally without wheezes or crackles. Heart: Bradycardic but regular with occasional extrasystoles.  No murmurs. Abdomen: Soft, nontender, nondistended. Extremities: No lower extremity edema.  EKG: Sinus bradycardia with occasional PVCs, left axis deviation, and nonspecific ST segment changes.  PVCs are new since  05/01/2022.  Otherwise, no significant interval change.  Lab Results  Component Value Date   WBC 5.1 06/30/2022   HGB 15.1 06/30/2022   HCT 42.5 06/30/2022   MCV 88 06/30/2022   PLT 165 06/30/2022    Lab Results  Component Value Date   NA 143 07/29/2022   K 3.9 07/29/2022   CL 106 07/29/2022   CO2 23 07/29/2022   BUN 13 07/29/2022   CREATININE 1.25 07/29/2022   GLUCOSE 102 (H) 07/29/2022   ALT 51 (H) 07/29/2022    Lab Results  Component Value Date   CHOL 131 06/30/2022   HDL 79 06/30/2022   LDLCALC 37 06/30/2022   TRIG 77 06/30/2022    --------------------------------------------------------------------------------------------------  ASSESSMENT AND PLAN: Persistent atrial fibrillation: No palpitations to suggest recurrent atrial fibrillation noted.  EKG today again shows sinus rhythm albeit with a few PVCs today.  Continue to hold beta-blocker in the setting of baseline bradycardia.  Continue indefinite anticoagulation with apixaban.  PFO: Discovered in the setting of workup for cryptogenic stroke.  Patient is status post percutaneous occlusion.  Hypertension: Blood pressure upper normal today.  Continue current regimen of amlodipine 7.5 mg daily with upcoming follow-up with Dr. Laural Benes.  Hyperlipidemia: Lipids well-controlled on last  check in February (goal LDL less than 70 in the setting of prior stroke).  Mildly elevated ALT noted on labs from March.  Continue atorvastatin 80 mg daily; consider repeating ALT with next lab draw to ensure that this has not increased.  Recommend discontinuation of fish oil, given normal triglycerides on last check and association between fish oil use and atrial fibrillation.  Follow-up: Return to clinic in 6 months.  Yvonne Kendall, MD 11/05/2022 10:39 AM

## 2022-11-05 NOTE — Patient Instructions (Signed)
Medication Instructions:   STOP TAKING Fish Oil  *If you need a refill on your cardiac medications before your next appointment, please call your pharmacy*   Lab Work: None ordered today   Testing/Procedures: None ordered today   Follow-Up: At [facility], you and your health needs are our priority.  As part of our continuing mission to provide you with exceptional heart care, we have created designated Provider Care Teams.  These Care Teams include your primary Cardiologist (physician) and Advanced Practice Providers (APPs -  Physician Assistants and Nurse Practitioners) who all work together to provide you with the care you need, when you need it.  We recommend signing up for the patient portal called "MyChart".  Sign up information is provided on this After Visit Summary.  MyChart is used to connect with patients for Virtual Visits (Telemedicine).  Patients are able to view lab/test results, encounter notes, upcoming appointments, etc.  Non-urgent messages can be sent to your provider as well.   To learn more about what you can do with MyChart, go to [email or web address].    Your next appointment:   6 month(s)  Provider:   You may see Yvonne Kendall, MD or one of the following Advanced Practice Providers on your designated Care Team:   Nicolasa Ducking, NP Eula Listen, PA-C Cadence Fransico Michael, PA-C Charlsie Quest, NP

## 2022-11-06 ENCOUNTER — Encounter: Payer: Self-pay | Admitting: Internal Medicine

## 2022-12-29 ENCOUNTER — Ambulatory Visit (INDEPENDENT_AMBULATORY_CARE_PROVIDER_SITE_OTHER): Payer: Medicare Other | Admitting: Family Medicine

## 2022-12-29 ENCOUNTER — Encounter: Payer: Self-pay | Admitting: Family Medicine

## 2022-12-29 VITALS — BP 137/78 | HR 56 | Temp 97.7°F | Wt 272.2 lb

## 2022-12-29 DIAGNOSIS — I10 Essential (primary) hypertension: Secondary | ICD-10-CM

## 2022-12-29 DIAGNOSIS — E785 Hyperlipidemia, unspecified: Secondary | ICD-10-CM

## 2022-12-29 DIAGNOSIS — R3911 Hesitancy of micturition: Secondary | ICD-10-CM

## 2022-12-29 LAB — URINALYSIS, ROUTINE W REFLEX MICROSCOPIC
Bilirubin, UA: NEGATIVE
Glucose, UA: NEGATIVE
Leukocytes,UA: NEGATIVE
Nitrite, UA: NEGATIVE
RBC, UA: NEGATIVE
Specific Gravity, UA: >1.03 — ABNORMAL HIGH (ref 1.005–1.030)
Urobilinogen, Ur: 0.2 mg/dL (ref 0.2–1.0)
pH, UA: 5.5 (ref 5.0–7.5)

## 2022-12-29 LAB — MICROSCOPIC EXAMINATION: Bacteria, UA: NONE SEEN

## 2022-12-29 LAB — MICROALBUMIN, URINE WAIVED
Creatinine, Urine Waived: 300 mg/dL (ref 10–300)
Microalb, Ur Waived: 80 mg/L — ABNORMAL HIGH (ref 0–19)

## 2022-12-29 MED ORDER — APIXABAN 5 MG PO TABS: 5.0000 mg | ORAL_TABLET | Freq: Two times a day (BID) | ORAL | 1 refills | Status: DC

## 2022-12-29 MED ORDER — AMLODIPINE BESYLATE 5 MG PO TABS: 7.5000 mg | ORAL_TABLET | Freq: Every day | ORAL | 1 refills | Status: DC

## 2022-12-29 MED ORDER — ATORVASTATIN CALCIUM 80 MG PO TABS: 80.0000 mg | ORAL_TABLET | Freq: Every day | ORAL | 1 refills | Status: DC

## 2022-12-29 NOTE — Assessment & Plan Note (Signed)
Under good control on current regimen. Continue current regimen. Continue to monitor. Call with any concerns. Refills given. Labs drawn today.   

## 2022-12-29 NOTE — Progress Notes (Signed)
BP 137/78   Pulse (!) 56   Temp 97.7 F (36.5 C) (Oral)   Wt 272 lb 3.2 oz (123.5 kg)   SpO2 95%   BMI 34.02 kg/m    Subjective:    Patient ID: Zachary Rogers, male    DOB: February 19, 1951, 72 y.o.   MRN: [id number]  HPI: Zachary Rogers is a 72 y.o. male  Chief Complaint  Patient presents with   Hypertension   HYPERTENSION / HYPERLIPIDEMIA Satisfied with current treatment? yes Duration of hypertension: chronic BP monitoring frequency: not checking BP medication side effects: no Past BP meds: amlodipine Duration of hyperlipidemia: chronic Cholesterol medication side effects: no Cholesterol supplements: none Past cholesterol medications: atorvastatin Medication compliance: excellent compliance Aspirin: no Recent stressors: no Recurrent headaches: no Visual changes: no Palpitations: no Dyspnea: no Chest pain: no Lower extremity edema: no Dizzy/lightheaded: yes  Relevant past medical, surgical, family and social history reviewed and updated as indicated. Interim medical history since our last visit reviewed. Allergies and medications reviewed and updated.  Review of Systems  Constitutional: Negative.   Respiratory: Negative.    Cardiovascular: Negative.   Musculoskeletal: Negative.   Neurological:  Positive for dizziness. Negative for tremors, seizures, syncope, facial asymmetry, speech difficulty, weakness, light-headedness, numbness and headaches.  Psychiatric/Behavioral: Negative.      Per HPI unless specifically indicated above     Objective:    BP 137/78   Pulse (!) 56   Temp 97.7 F (36.5 C) (Oral)   Wt 272 lb 3.2 oz (123.5 kg)   SpO2 95%   BMI 34.02 kg/m   Wt Readings from Last 3 Encounters:  12/29/22 272 lb 3.2 oz (123.5 kg)  11/05/22 271 lb (122.9 kg)  09/29/22 270 lb (122.5 kg)    Physical Exam Vitals and nursing note reviewed.  Constitutional:      General: He is not in acute distress.    Appearance: Normal appearance. He is not  ill-appearing, toxic-appearing or diaphoretic.  HENT:     Head: Normocephalic and atraumatic.     Right Ear: External ear normal.     Left Ear: External ear normal.     Nose: Nose normal.     Mouth/Throat:     Mouth: Mucous membranes are moist.     Pharynx: Oropharynx is clear.  Eyes:     General: No scleral icterus.       Right eye: No discharge.        Left eye: No discharge.     Extraocular Movements: Extraocular movements intact.     Conjunctiva/sclera: Conjunctivae normal.     Pupils: Pupils are equal, round, and reactive to light.  Cardiovascular:     Rate and Rhythm: Normal rate and regular rhythm.     Pulses: Normal pulses.     Heart sounds: Normal heart sounds. No murmur heard.    No friction rub. No gallop.  Pulmonary:     Effort: Pulmonary effort is normal. No respiratory distress.     Breath sounds: Normal breath sounds. No stridor. No wheezing, rhonchi or rales.  Chest:     Chest wall: No tenderness.  Musculoskeletal:        General: Normal range of motion.     Cervical back: Normal range of motion and neck supple.  Skin:    General: Skin is warm and dry.     Capillary Refill: Capillary refill takes less than 2 seconds.     Coloration: Skin is not jaundiced  or pale.     Findings: No bruising, erythema, lesion or rash.  Neurological:     General: No focal deficit present.     Mental Status: He is alert and oriented to person, place, and time. Mental status is at baseline.  Psychiatric:        Mood and Affect: Mood normal.        Behavior: Behavior normal.        Thought Content: Thought content normal.        Judgment: Judgment normal.     Results for orders placed or performed in visit on 09/17/22  PSA  Result Value Ref Range   Prostate Specific Ag, Serum 2.4 0.0 - 4.0 ng/mL      Assessment & Plan:   Problem List Items Addressed This Visit       Cardiovascular and Mediastinum   Essential hypertension - Primary    Under good control on current  regimen. Continue current regimen. Continue to monitor. Call with any concerns. Refills given. Labs drawn today.       Relevant Medications   amLODipine (NORVASC) 5 MG tablet   apixaban (ELIQUIS) 5 MG TABS tablet   atorvastatin (LIPITOR) 80 MG tablet   Other Relevant Orders   CBC with Differential/Platelet   Comprehensive metabolic panel   Microalbumin, Urine Waived   TSH   Urinalysis, Routine w reflex microscopic     Other   Hyperlipidemia LDL goal <70    Under good control on current regimen. Continue current regimen. Continue to monitor. Call with any concerns. Refills given. Labs drawn today.      Relevant Medications   amLODipine (NORVASC) 5 MG tablet   apixaban (ELIQUIS) 5 MG TABS tablet   atorvastatin (LIPITOR) 80 MG tablet   Other Relevant Orders   CBC with Differential/Platelet   Comprehensive metabolic panel   Lipid Panel w/o Chol/HDL Ratio   Other Visit Diagnoses     Hesitancy       Labs drawn today. Await results.   Relevant Orders   PSA        Follow up plan: Return in about 6 months (around 07/01/2023).

## 2023-01-02 ENCOUNTER — Other Ambulatory Visit: Payer: Self-pay | Admitting: Family Medicine

## 2023-01-02 DIAGNOSIS — N289 Disorder of kidney and ureter, unspecified: Secondary | ICD-10-CM

## 2023-01-05 NOTE — Progress Notes (Signed)
Called and scheduled patient on 01/13/2023 @ 9:00 am.

## 2023-01-13 ENCOUNTER — Other Ambulatory Visit: Payer: Medicare Other

## 2023-01-13 DIAGNOSIS — N289 Disorder of kidney and ureter, unspecified: Secondary | ICD-10-CM

## 2023-01-14 LAB — BASIC METABOLIC PANEL
BUN/Creatinine Ratio: 9 — ABNORMAL LOW (ref 10–24)
BUN: 11 mg/dL (ref 8–27)
CO2: 24 mmol/L (ref 20–29)
Calcium: 9.4 mg/dL (ref 8.6–10.2)
Chloride: 104 mmol/L (ref 96–106)
Creatinine, Ser: 1.23 mg/dL (ref 0.76–1.27)
Glucose: 100 mg/dL — ABNORMAL HIGH (ref 70–99)
Potassium: 4.2 mmol/L (ref 3.5–5.2)
Sodium: 143 mmol/L (ref 134–144)
eGFR: 62 mL/min/{1.73_m2} (ref >59)

## 2023-01-28 ENCOUNTER — Other Ambulatory Visit: Payer: Self-pay | Admitting: Family Medicine

## 2023-01-29 NOTE — Telephone Encounter (Signed)
Change pharmacy. Requested Prescriptions  Pending Prescriptions Disp Refills   amLODipine (NORVASC) 5 MG tablet [Pharmacy Med Name: AMLODIPINE BESYLATE TABS 5MG ] 135 tablet 3    Sig: TAKE ONE AND ONE-HALF TABLETS DAILY     Cardiovascular: Calcium Channel Blockers 2 Passed - 01/28/2023  8:17 AM      Passed - Last BP in normal range    BP Readings from Last 1 Encounters:  12/29/22 137/78         Passed - Last Heart Rate in normal range    Pulse Readings from Last 1 Encounters:  12/29/22 (!) 56         Passed - Valid encounter within last 6 months    Recent Outpatient Visits           1 month ago Essential hypertension   [place] [facility] [place], [place], DO   6 months ago Essential hypertension   [place] [facility] [place], [place], DO   7 months ago Essential hypertension   [place] [facility] [place], [place], DO   1 year ago Persistent atrial fibrillation [facility])   [place] [facility] [place], Megan P, DO   1 year ago Essential hypertension   [place] [facility] [place], Oralia Rud, DO       Future Appointments             In 3 months End, Cristal Deer, MD [facility] [place]   In 5 months Dorcas Carrow, DO [place] [facility], [facility]   In 7 months McG[place]an, Elana Alm [facility]

## 2023-02-18 ENCOUNTER — Other Ambulatory Visit: Payer: Self-pay | Admitting: Family Medicine

## 2023-02-18 NOTE — Telephone Encounter (Signed)
Requested Prescriptions  Pending Prescriptions Disp Refills   apixaban (ELIQUIS) 5 MG TABS tablet [Pharmacy Med Name: ELIQUIS TABS 5MG ] 180 tablet 1    Sig: TAKE 1 TABLET TWICE A DAY     Hematology:  Anticoagulants - apixaban Failed - 02/18/2023  3:22 AM      Failed - ALT in normal range and within 360 days    ALT  Date Value Ref Range Status  12/29/2022 57 (H) 0 - 44 IU/L Final         Passed - PLT in normal range and within 360 days    Platelets  Date Value Ref Range Status  12/29/2022 164 150 - 450 x10E3/uL Final         Passed - HGB in normal range and within 360 days    Hemoglobin  Date Value Ref Range Status  12/29/2022 15.0 13.0 - 17.7 g/dL Final         Passed - HCT in normal range and within 360 days    Hematocrit  Date Value Ref Range Status  12/29/2022 44.6 37.5 - 51.0 % Final         Passed - Cr in normal range and within 360 days    Creatinine, Ser  Date Value Ref Range Status  01/13/2023 1.23 0.76 - 1.27 mg/dL Final         Passed - AST in normal range and within 360 days    AST  Date Value Ref Range Status  12/29/2022 39 0 - 40 IU/L Final         Passed - Valid encounter within last 12 months    Recent Outpatient Visits           1 month ago Essential hypertension   [facility] [place], Megan P, DO   6 months ago Essential hypertension   [facility] [place], [place], DO   7 months ago Essential hypertension   [place] [facility] [place], [place], DO   1 year ago Persistent atrial fibrillation [facility])   [place] [facility] [place], Megan P, DO   1 year ago Essential hypertension   [place] [facility] [place], Oralia Rud, DO       Future Appointments             In 2 months End, Cristal Deer, MD [facility] [place]   In 4 months Dorcas Carrow, DO [place] [facility], [facility]   In 7 months McGowan, Elana Alm  [place] [facility]

## 2023-02-24 ENCOUNTER — Other Ambulatory Visit: Payer: Self-pay | Admitting: Family Medicine

## 2023-02-24 NOTE — Telephone Encounter (Signed)
Refused atorvastatin because it is being requested too soon.

## 2023-03-18 ENCOUNTER — Ambulatory Visit: Payer: Self-pay

## 2023-03-18 NOTE — Telephone Encounter (Signed)
Summary: Medication questions   Patient's wife called and stated that the patient is currently stuck in [place] with his friend that is in the hospital. Patient has 6 days left of his medications and is estimated to stay an additional 2 weeks. Patient's wife wants to know what can be done to get patient enough medication to last until he can get home. Please advise.    Called pt's wife and LM on VM. Sounded like call was disconnected on their end so unsure if entire message was received. . Sending to office.

## 2023-03-18 NOTE — Telephone Encounter (Signed)
  Chief Complaint: med question Symptoms: NA Frequency:  Pertinent Negatives: NA Disposition: [] ED /[] Urgent Care (no appt availability in office) / [] Appointment(In office/virtual)/ []  [place]/ [] Home Care/ [] Refused Recommended Disposition /[] [facility]/ [x]  Follow-up with PCP Additional Notes: spoke with pt's wife. Pt is in [place] staying with a friend who is in the hospital for atleast 2 weeks. Pt has 6 days left of his meds and wife is concerned because she is unsure how to get medications to him. She is asking if Dr. Laural Benes has a way to prescribe to pharmacy in [place]. I advised that I felt like prescribing power is only for [place] and [facility] but would send message back for review. Asked if they would be able to refill here and then mail to pt there but she feels like [place] would never get meds on time shipping via [employer]. Advised I would have PCP or CMA FU with her on advice. Teri verbalized understanding. She is also contacting insurance to see if they had any recommendations.   Summary: Medication questions   Patient's wife called and stated that the patient is currently stuck in [place] with his friend that is in the hospital. Patient has 6 days left of his medications and is estimated to stay an additional 2 weeks. Patient's wife wants to know what can be done to get patient enough medication to last until he can get home. Please advise.          Reason for Disposition  [1] Caller has URGENT medicine question about med that PCP or specialist prescribed AND [2] triager unable to answer question  Answer Assessment - Initial Assessment Questions 1. NAME of MEDICINE: "What medicine(s) are you calling about?"     All of his meds 2. QUESTION: "What is your question?" (e.g., double dose of medicine, side effect)     Wanting to know how to get pt his meds since he has 6 days remaining of them all 3. PRESCRIBER: "Who prescribed the medicine?" Reason: if prescribed by  specialist, call should be referred to that group.     Dr. Laural Benes  4. SYMPTOMS: "Do you have any symptoms?" If Yes, ask: "What symptoms are you having?"  "How bad are the symptoms (e.g., mild, moderate, severe)     No  Protocols used: Medication Question Call-A-AH

## 2023-03-18 NOTE — Telephone Encounter (Signed)
Unfortunately I cannot prescribe medicine to [place] and I don't have any recommendations on getting it from here there- I would recommend that he go to the pharmacy and see if any of his medicine is over the counter or go to see an urgent care to see if they can get him enough to make it until he gets home. Unfortunately I don't even know if [place] has urgent cares, so I'm not sure how he would even get it.

## 2023-03-19 NOTE — Telephone Encounter (Signed)
Called and LVM asking for patient's wife to please return my call.   OK for [facility] to advise patient's wife of Dr. Henriette Combs message if she calls back.

## 2023-04-01 ENCOUNTER — Encounter: Payer: Medicare Other | Admitting: Dermatology

## 2023-04-07 ENCOUNTER — Ambulatory Visit (INDEPENDENT_AMBULATORY_CARE_PROVIDER_SITE_OTHER): Payer: Medicare Other | Admitting: Family Medicine

## 2023-04-07 VITALS — BP 132/73 | HR 58 | Temp 97.8°F | Ht 75.39 in | Wt 253.4 lb

## 2023-04-07 DIAGNOSIS — R12 Heartburn: Secondary | ICD-10-CM | POA: Diagnosis not present

## 2023-04-07 DIAGNOSIS — R197 Diarrhea, unspecified: Secondary | ICD-10-CM | POA: Diagnosis not present

## 2023-04-07 MED ORDER — ONDANSETRON HCL 4 MG PO TABS: 4.0000 mg | ORAL_TABLET | Freq: Three times a day (TID) | ORAL | 0 refills | Status: DC | PRN

## 2023-04-07 NOTE — Assessment & Plan Note (Signed)
Acute, resolved now. Will check stool studies, provided materials and instructed to bring back to office if symptoms reoccur. Recommend BRAT diet if decreased appetite remains, encouraged hydration. CMP, CBC done today. Will treat based on results. Call if concerns arise.

## 2023-04-07 NOTE — Assessment & Plan Note (Addendum)
Acute, stable. Recommend preventative measures for now. Avoid eating 3-4 hours before bedtime, remain in a elevated position 3-4 hours after eating, avoid spicy, greasy foods that may trigger symptoms. Recommend over the counter treatment with TUMS as needed, if symptoms worsen will consider starting Pantoprazole 20 MG daily. Provided information on heartburn with foods to avoid. Script sent for Zofran as needed for nausea. Call if concerns arise.

## 2023-04-07 NOTE — Progress Notes (Signed)
BP 132/73   Pulse (!) 58   Temp 97.8 F (36.6 C) (Oral)   Ht 6' 3.39" (1.915 m)   Wt 253 lb 6.4 oz (114.9 kg)   SpO2 96%   BMI 31.34 kg/m    Subjective:    Patient ID: Zachary Rogers, male    DOB: May 17, 1951, 72 y.o.   MRN: [id number]  HPI: Zachary Rogers is a 72 y.o. male  Chief Complaint  Patient presents with   Heartburn   Diarrhea   He complains of ongoing diarrhea, x3 episodes daily for 5 days since returning from travels to [place] along with decreased appetite, identified bristol stool chart as #7, states diarrhea has resolved now.  HEARTBURN Patient recently returned from traveling to [place] 5 days ago. The day before returning he started to experience some heartburn, he tried otc Gaviscon and this provided immediate relief. He admits to x 3 occurrences of heartburn after returning and experiencing some nausea. He admits to daily caffeine use, spicy foods, and tried different foods during his travel.  Admits to nausea and epigastric tenderness during episode. Denies pain after eating or with eating.  Dysphagia: no Odynophagia:  no Hematemesis: no Blood in stool: no EGD: no    Relevant past medical, surgical, family and social history reviewed and updated as indicated. Interim medical history since our last visit reviewed. Allergies and medications reviewed and updated.  Review of Systems  Constitutional:  Positive for fatigue. Negative for fever.  Respiratory: Negative.    Cardiovascular: Negative.   Gastrointestinal:  Positive for diarrhea and nausea. Negative for abdominal pain, blood in stool, constipation and vomiting.    Per HPI unless specifically indicated above     Objective:    BP 132/73   Pulse (!) 58   Temp 97.8 F (36.6 C) (Oral)   Ht 6' 3.39" (1.915 m)   Wt 253 lb 6.4 oz (114.9 kg)   SpO2 96%   BMI 31.34 kg/m   Wt Readings from Last 3 Encounters:  04/07/23 253 lb 6.4 oz (114.9 kg)  12/29/22 272 lb 3.2 oz (123.5 kg)  11/05/22 271 lb  (122.9 kg)    Physical Exam Vitals and nursing note reviewed.  Constitutional:      General: He is awake. He is not in acute distress.    Appearance: Normal appearance. He is well-developed and well-groomed. He is obese. He is not ill-appearing.  HENT:     Head: Normocephalic and atraumatic.     Right Ear: Hearing and external ear normal. No drainage.     Left Ear: Hearing and external ear normal. No drainage.     Nose: Nose normal.  Eyes:     General: Lids are normal.        Right eye: No discharge.        Left eye: No discharge.     Conjunctiva/sclera: Conjunctivae normal.  Cardiovascular:     Rate and Rhythm: Regular rhythm. Bradycardia present.     Pulses:          Radial pulses are 2+ on the right side and 2+ on the left side.       Posterior tibial pulses are 2+ on the right side and 2+ on the left side.     Heart sounds: Normal heart sounds, S1 normal and S2 normal. No murmur heard.    No gallop.  Pulmonary:     Effort: Pulmonary effort is normal. No accessory muscle usage or respiratory  distress.     Breath sounds: Normal breath sounds. No wheezing, rhonchi or rales.  Abdominal:     General: Bowel sounds are decreased.     Tenderness: There is no abdominal tenderness.  Musculoskeletal:        General: Normal range of motion.     Cervical back: Full passive range of motion without pain and normal range of motion.     Right lower leg: No edema.     Left lower leg: No edema.  Skin:    General: Skin is warm and dry.     Capillary Refill: Capillary refill takes less than 2 seconds.  Neurological:     Mental Status: He is alert and oriented to person, place, and time.  Psychiatric:        Attention and Perception: Attention normal.        Mood and Affect: Mood normal.        Speech: Speech normal.        Behavior: Behavior normal. Behavior is cooperative.        Thought Content: Thought content normal.     Results for orders placed or performed in visit on 01/13/23   Basic metabolic panel  Result Value Ref Range   Glucose 100 (H) 70 - 99 mg/dL   BUN 11 8 - 27 mg/dL   Creatinine, Ser 7.82 0.76 - 1.27 mg/dL   eGFR 62 >95 AO/ZHY/8.65   BUN/Creatinine Ratio 9 (L) 10 - 24   Sodium 143 134 - 144 mmol/L   Potassium 4.2 3.5 - 5.2 mmol/L   Chloride 104 96 - 106 mmol/L   CO2 24 20 - 29 mmol/L   Calcium 9.4 8.6 - 10.2 mg/dL      Assessment & Plan:   Problem List Items Addressed This Visit     Heartburn - Primary    Acute, stable. Recommend preventative measures for now. Avoid eating 3-4 hours before bedtime, remain in a elevated position 3-4 hours after eating, avoid spicy, greasy foods that may trigger symptoms. Recommend over the counter treatment with TUMS as needed, if symptoms worsen will consider starting Pantoprazole 20 MG daily. Provided information on heartburn with foods to avoid. Script sent for Zofran as needed for nausea. Call if concerns arise.       Relevant Orders   CBC w/Diff   Comp Met (CMET)   Diarrhea    Acute, resolved now. Will check stool studies, provided materials and instructed to bring back to office if symptoms reoccur. Recommend BRAT diet if decreased appetite remains, encouraged hydration. CMP, CBC done today. Will treat based on results. Call if concerns arise.       Relevant Orders   H. pylori antigen, stool   Cdiff NAA+O+P+Stool Culture   Stool Culture   Fecal occult blood, imunochemical     Follow up plan: Return if symptoms worsen or fail to improve.

## 2023-04-08 LAB — CBC WITH DIFFERENTIAL/PLATELET
Basophils Absolute: 0.1 10*3/uL (ref 0.0–0.2)
Basos: 1 %
EOS (ABSOLUTE): 0.4 10*3/uL (ref 0.0–0.4)
Eos: 6 %
Hematocrit: 44.9 % (ref 37.5–51.0)
Hemoglobin: 15 g/dL (ref 13.0–17.7)
Immature Grans (Abs): 0 10*3/uL (ref 0.0–0.1)
Immature Granulocytes: 1 %
Lymphocytes Absolute: 1.5 10*3/uL (ref 0.7–3.1)
Lymphs: 23 %
MCH: 30.2 pg (ref 26.6–33.0)
MCHC: 33.4 g/dL (ref 31.5–35.7)
MCV: 91 fL (ref 79–97)
Monocytes Absolute: 0.6 10*3/uL (ref 0.1–0.9)
Monocytes: 9 %
Neutrophils Absolute: 3.9 10*3/uL (ref 1.4–7.0)
Neutrophils: 60 %
Platelets: 226 10*3/uL (ref 150–450)
RBC: 4.96 x10E6/uL (ref 4.14–5.80)
RDW: 13.2 % (ref 11.6–15.4)
WBC: 6.4 10*3/uL (ref 3.4–10.8)

## 2023-04-08 LAB — COMPREHENSIVE METABOLIC PANEL
ALT: 59 [IU]/L — ABNORMAL HIGH (ref 0–44)
AST: 38 [IU]/L (ref 0–40)
Albumin: 4.3 g/dL (ref 3.8–4.8)
Alkaline Phosphatase: 118 [IU]/L (ref 44–121)
BUN/Creatinine Ratio: 15 (ref 10–24)
BUN: 18 mg/dL (ref 8–27)
Bilirubin Total: 1.8 mg/dL — ABNORMAL HIGH (ref 0.0–1.2)
CO2: 19 mmol/L — ABNORMAL LOW (ref 20–29)
Calcium: 9.1 mg/dL (ref 8.6–10.2)
Chloride: 107 mmol/L — ABNORMAL HIGH (ref 96–106)
Creatinine, Ser: 1.21 mg/dL (ref 0.76–1.27)
Globulin, Total: 2.4 g/dL (ref 1.5–4.5)
Glucose: 101 mg/dL — ABNORMAL HIGH (ref 70–99)
Potassium: 3.5 mmol/L (ref 3.5–5.2)
Sodium: 144 mmol/L (ref 134–144)
Total Protein: 6.7 g/dL (ref 6.0–8.5)
eGFR: 63 mL/min/{1.73_m2} (ref >59)

## 2023-04-08 NOTE — Progress Notes (Signed)
Hi [name], your lab results have returned normal and stable. No indications of electrolyte imbalances or infection. Will await on those stool samples. Thank you for allowing me to participate in your care.

## 2023-04-13 DIAGNOSIS — R197 Diarrhea, unspecified: Secondary | ICD-10-CM | POA: Diagnosis not present

## 2023-04-15 LAB — H. PYLORI ANTIGEN, STOOL: H pylori Ag, Stl: NEGATIVE

## 2023-04-17 LAB — CDIFF NAA+O+P+STOOL CULTURE
E coli, Shiga toxin Assay: NEGATIVE
Toxigenic C. Difficile by PCR: NEGATIVE

## 2023-04-21 NOTE — Progress Notes (Signed)
Hi [name], your stool studies and culture have returned as negative. Thank you for allowing me to participate in your care.

## 2023-05-04 DIAGNOSIS — Z8673 Personal history of transient ischemic attack (TIA), and cerebral infarction without residual deficits: Secondary | ICD-10-CM | POA: Diagnosis not present

## 2023-05-04 DIAGNOSIS — M5432 Sciatica, left side: Secondary | ICD-10-CM | POA: Diagnosis not present

## 2023-05-04 DIAGNOSIS — M545 Low back pain, unspecified: Secondary | ICD-10-CM | POA: Diagnosis not present

## 2023-05-07 ENCOUNTER — Ambulatory Visit: Payer: Medicare Other | Attending: Internal Medicine | Admitting: Internal Medicine

## 2023-05-07 ENCOUNTER — Encounter: Payer: Self-pay | Admitting: Internal Medicine

## 2023-05-07 VITALS — BP 130/64 | HR 52 | Ht 75.0 in | Wt 267.8 lb

## 2023-05-07 DIAGNOSIS — I10 Essential (primary) hypertension: Secondary | ICD-10-CM | POA: Diagnosis not present

## 2023-05-07 DIAGNOSIS — E785 Hyperlipidemia, unspecified: Secondary | ICD-10-CM | POA: Diagnosis not present

## 2023-05-07 DIAGNOSIS — Z8673 Personal history of transient ischemic attack (TIA), and cerebral infarction without residual deficits: Secondary | ICD-10-CM | POA: Diagnosis not present

## 2023-05-07 DIAGNOSIS — I4819 Other persistent atrial fibrillation: Secondary | ICD-10-CM | POA: Diagnosis not present

## 2023-05-07 DIAGNOSIS — Q2112 Patent foramen ovale: Secondary | ICD-10-CM | POA: Diagnosis not present

## 2023-05-07 NOTE — Progress Notes (Signed)
Cardiology Office Note:  .   Date:  05/07/2023  ID:  Zachary Rogers, DOB 17-Oct-1950, MRN [id number] PCP: Dorcas Carrow, DO   HeartCare Providers Cardiologist:  Yvonne Kendall, MD     History of Present Illness: .   Zachary Rogers is a 72 y.o. male with history of stroke in the setting of PFO status post percutaneous closure and subsequent development of paroxysmal atrial fibrillation, hypertension, hyperlipidemia, and nephrolithiasis, who presents for follow-up of atrial fibrillation.  I last saw him in June, at which time he was feeling well without recurrent palpitations or dyspnea.  He noted occasional dizziness when turning his head felt to be due to vertigo.  I recommended discontinuation of fish oil.  Otherwise, no medication changes or additional testing were pursued.  Today, Zachary Rogers reports that he has been feeling fairly well, denying chest pain, shortness of breath, palpitations, and lightheadedness.  He notes rare vertigo, which has been longstanding and is unchanged.  He reports sporadic dependent ankle edema but no persistent leg swelling.  He notes that he recently spent several weeks in [place] caring for a friend with whom he was on a cruise.  His friend was hospitalized with pneumonia for several weeks.  Zachary Rogers remains compliant with his medications, including apixaban.  He has not had any significant bleeding.  Home blood pressures typically around 130/60.  ROS: See HPI  Studies Reviewed: Marland Kitchen   EKG Interpretation Date/Time:  Thursday May 07 2023 09:27:52 EST Ventricular Rate:  52 PR Interval:    QRS Duration:  100 QT Interval:  444 QTC Calculation: 412 R Axis:   -43  Text Interpretation: Sinus bradycardia Left axis deviation Nonspecific ST and T wave abnormality When compared with ECG of 05-Nov-2022 Premature ventricular complexes are no longer Present Otherwise no significant change Confirmed by Frazier Balfour, Cristal Deer [phone]) on 05/07/2023 2:12:01 PM     CV Surgery: PFO closure (02/13/2021): 18 mm Amplatz atrial septal occluder   EP Procedures and Devices: DCCV (04/30/2021, 12/24/2021) 14-day event monitor (11/14/2020): Predominantly sinus rhythm with occasional PACs and rare PVCs as well as several brief runs of PSVT.  At least one episode of idioventricular rhythm was also noted.  No atrial fibrillation/flutter observed.   Non-Invasive Evaluation(s): Exercise MPI (05/15/2022): Low risk study without evidence of ischemia or scar.  Hypertensive blood pressure response noted.  Normal LVEF (62%).  Mild coronary artery calcifications noted Limited TTE (02/13/2021): Amplatz closure device in place with no residual PFO.  Mild right atrial enlargement.  Mild mitral annular calcification and mitral valve thickening.  Trivial tricuspid regurgitation.  Aortic sclerosis. TEE (12/17/2020): Normal LV size.  LVEF 60-65%.  Normal RV size and function.  Normal biatrial size without thrombus.  Normal mitral valve with mild regurgitation.  Normal tricuspid valve with trivial regurgitation.  Tricuspid aortic valve with trivial regurgitation.  Normal pulmonic valve with mild regurgitation.  Minimal plaque in the thoracic aorta.  Atrial septal aneurysm with large PFO and positive bubble study noted. Carotid Doppler (11/13/2020): Mild right ICA stenosis.  Normal left carotid artery.  Atypical antegrade flow in both vertebral arteries.  Flow disturbance in right subclavian artery noted. TTE (11/06/2020): Mildly dilated LV with normal wall thickness.  Normal LVEF (50-55%) with normal wall motion.  Indeterminate diastolic parameters.  Normal RV size and function.  Normal biatrial size.  Trivial mitral and tricuspid regurgitation.  Positive bubble study.  Risk Assessment/Calculations:    CHA2DS2-VASc Score = 4   This  indicates a 4.8% annual risk of stroke. The patient's score is based upon: CHF History: 0 HTN History: 1 Diabetes History: 0 Stroke History: 2 Vascular  Disease History: 0 Age Score: 1 Gender Score: 0            Physical Exam:   VS:  BP 130/64 (BP Location: Left Arm, Patient Position: Sitting, Cuff Size: Normal)   Pulse (!) 52   Ht 6\' 3"  (1.905 m)   Wt 267 lb 12.8 oz (121.5 kg)   SpO2 97%   BMI 33.47 kg/m    Wt Readings from Last 3 Encounters:  05/07/23 267 lb 12.8 oz (121.5 kg)  04/07/23 253 lb 6.4 oz (114.9 kg)  12/29/22 272 lb 3.2 oz (123.5 kg)    General:  NAD. Neck: No JVD or HJR. Lungs: Clear to auscultation bilaterally without wheezes or crackles. Heart: Regular rate and rhythm without murmurs, rubs, or gallops. Abdomen: Soft, nontender, nondistended. Extremities: No lower extremity edema.  ASSESSMENT AND PLAN: .    Persistent atrial fibrillation: No symptoms to suggest recurrent atrial fibrillation, which was likely precipitated by PFO occluder placement.  Given that he is tolerating apixaban well and is at elevated risk for cardioembolic events in the setting of a CHA2DS2-VASc score of 4.  We will continue with anticoagulation.  PFO: Status post occlusion.  Also on anticoagulation now in the setting of subsequent atrial fibrillation.  Hypertension: Blood pressure upper normal today.  Continue amlodipine 5 mg daily.  Hyperlipidemia: Continue atorvastatin under the direction of Dr. Laural Benes for secondary prevention of stroke.  Defer ongoing surveillance and management of mildly elevated ALT to Dr. Laural Benes.    Dispo: Return to clinic in 1 year.  Signed, Yvonne Kendall, MD

## 2023-05-07 NOTE — Patient Instructions (Signed)
 Medication Instructions:  No changes *If you need a refill on your cardiac medications before your next appointment, please call your pharmacy*   Lab Work: None ordered If you have labs (blood work) drawn today and your tests are completely normal, you will receive your results only by: MyChart Message (if you have MyChart) OR A paper copy in the mail If you have any lab test that is abnormal or we need to change your treatment, we will call you to review the results.   Testing/Procedures: None ordered   Follow-Up: At [facility], you and your health needs are our priority.  As part of our continuing mission to provide you with exceptional heart care, we have created designated Provider Care Teams.  These Care Teams include your primary Cardiologist (physician) and Advanced Practice Providers (APPs -  Physician Assistants and Nurse Practitioners) who all work together to provide you with the care you need, when you need it.  We recommend signing up for the patient portal called "MyChart".  Sign up information is provided on this After Visit Summary.  MyChart is used to connect with patients for Virtual Visits (Telemedicine).  Patients are able to view lab/test results, encounter notes, upcoming appointments, etc.  Non-urgent messages can be sent to your provider as well.   To learn more about what you can do with MyChart, go to [email or web address].    Your next appointment:   12 month(s)  Provider:   You may see Yvonne Kendall, MD or one of the following Advanced Practice Providers on your designated Care Team:   Nicolasa Ducking, NP Eula Listen, PA-C Cadence Fransico Michael, PA-C Charlsie Quest, NP Carlos Levering, NP

## 2023-05-27 ENCOUNTER — Other Ambulatory Visit: Payer: Self-pay | Admitting: Family Medicine

## 2023-05-27 NOTE — Telephone Encounter (Signed)
 Medication Refill -  Most Recent Primary Care Visit:  Provider: THADDEUS SLOT RHONDA  Department: [facility]  Visit Type: OFFICE VISIT  Date: 04/07/2023  Medication: atorvastatin  (LIPITOR) 80 MG tablet   Has the patient contacted their pharmacy? Yes  Is this the correct pharmacy for this prescription? Yes If no, delete pharmacy and type the correct one.  This is the patient's preferred pharmacy: [id number] - [place], [place] - [address] AT [facility] [address] [place] [address] Phone: [phone] Fax: [phone]  Has the prescription been filled recently? No  Is the patient out of the medication? No  Has the patient been seen for an appointment in the last year OR does the patient have an upcoming appointment? Yes  Can we respond through MyChart? Yes  Agent: Please be advised that Rx refills may take up to 3 business days. We ask that you follow-up with your pharmacy.

## 2023-06-01 MED ORDER — ATORVASTATIN CALCIUM 80 MG PO TABS: 80.0000 mg | ORAL_TABLET | Freq: Every day | ORAL | 1 refills | Status: DC

## 2023-06-01 NOTE — Telephone Encounter (Signed)
 Requested Prescriptions  Pending Prescriptions Disp Refills   atorvastatin  (LIPITOR) 80 MG tablet 90 tablet 1    Sig: Take 1 tablet (80 mg total) by mouth daily.     Cardiovascular:  Antilipid - Statins Failed - 06/01/2023  7:48 AM      Failed - Lipid Panel in normal range within the last 12 months    Cholesterol, Total  Date Value Ref Range Status  12/29/2022 166 100 - 199 mg/dL Final   LDL Chol Calc (NIH)  Date Value Ref Range Status  12/29/2022 63 0 - 99 mg/dL Final   HDL  Date Value Ref Range Status  12/29/2022 87 >39 mg/dL Final   Triglycerides  Date Value Ref Range Status  12/29/2022 85 0 - 149 mg/dL Final         Passed - Patient is not pregnant      Passed - Valid encounter within last 12 months    Recent Outpatient Visits           1 month ago Heartburn   [place] [facility] [place], Hyla Givens, NP   5 months ago Essential hypertension   [place] [facility] [place], Megan P, DO   10 months ago Essential hypertension   [place], [place], DO   11 months ago Essential hypertension   [place] [facility] [place], [place], DO   1 year ago Persistent atrial fibrillation [facility]   [place] [place], Duwaine SQUIBB, DO       Future Appointments             In 1 month Johnson, Duwaine SQUIBB, DO [place] [facility]   In 3 months McGowan, Clotilda DELENA RIGGERS [facility]

## 2023-07-01 ENCOUNTER — Ambulatory Visit (INDEPENDENT_AMBULATORY_CARE_PROVIDER_SITE_OTHER): Payer: Medicare Other | Admitting: Family Medicine

## 2023-07-01 ENCOUNTER — Encounter: Payer: Self-pay | Admitting: Family Medicine

## 2023-07-01 ENCOUNTER — Telehealth: Payer: Self-pay

## 2023-07-01 VITALS — BP 123/61 | HR 45 | Temp 87.6°F | Wt 270.2 lb

## 2023-07-01 DIAGNOSIS — I700 Atherosclerosis of aorta: Secondary | ICD-10-CM

## 2023-07-01 DIAGNOSIS — E785 Hyperlipidemia, unspecified: Secondary | ICD-10-CM | POA: Diagnosis not present

## 2023-07-01 DIAGNOSIS — R140 Abdominal distension (gaseous): Secondary | ICD-10-CM | POA: Diagnosis not present

## 2023-07-01 DIAGNOSIS — R197 Diarrhea, unspecified: Secondary | ICD-10-CM

## 2023-07-01 DIAGNOSIS — I4819 Other persistent atrial fibrillation: Secondary | ICD-10-CM

## 2023-07-01 DIAGNOSIS — I10 Essential (primary) hypertension: Secondary | ICD-10-CM | POA: Diagnosis not present

## 2023-07-01 MED ORDER — ATORVASTATIN CALCIUM 80 MG PO TABS: 80.0000 mg | ORAL_TABLET | Freq: Every day | ORAL | 1 refills | Status: DC

## 2023-07-01 MED ORDER — AMLODIPINE BESYLATE 5 MG PO TABS: 7.5000 mg | ORAL_TABLET | Freq: Every day | ORAL | 1 refills | Status: DC

## 2023-07-01 MED ORDER — APIXABAN 5 MG PO TABS: 5.0000 mg | ORAL_TABLET | Freq: Two times a day (BID) | ORAL | 1 refills | Status: DC

## 2023-07-01 NOTE — Assessment & Plan Note (Signed)
Stable. Continue to follow with cardiology. Refills of eliquis given today.

## 2023-07-01 NOTE — Assessment & Plan Note (Signed)
Under good control on current regimen. Continue current regimen. Continue to monitor. Call with any concerns. Refills given. Labs drawn today.

## 2023-07-01 NOTE — Assessment & Plan Note (Signed)
Will keep BP and cholesterol under good control. Continue to monitor. Call with any concerns.

## 2023-07-01 NOTE — Telephone Encounter (Signed)
Patient returned call- patient states he has 2 insurance carriers and usually what Medicare does not cover- his Tricare will. Please let him know if he still needs to come by and sign ABN. Patient is in class today- it may be tomorrow before he can get back- may leave him a message on his voice,ail.

## 2023-07-01 NOTE — Assessment & Plan Note (Signed)
Stool studies negative previously. FOBT did not come back. Will check labs and start probiotics. Consider fiber if not getting better. Call with any concerns.

## 2023-07-01 NOTE — Progress Notes (Signed)
BP 123/61 (BP Location: Right Arm, Patient Position: Sitting)   Pulse (!) 45   Temp (!) 87.6 F (30.9 C)   Wt 270 lb 3.2 oz (122.6 kg)   SpO2 98%   BMI 33.77 kg/m    Subjective:    Patient ID: Zachary Rogers, male    DOB: [birth year], 73 y.o.   MRN: [id number]  HPI: Zachary Rogers is a 73 y.o. male  Chief Complaint  Patient presents with   Hypertension    No concerns    HYPERTENSION / HYPERLIPIDEMIA Satisfied with current treatment? yes Duration of hypertension: chronic BP monitoring frequency: not checking BP medication side effects: no Past BP meds: amlodipine Duration of hyperlipidemia: chronic Cholesterol medication side effects: no Cholesterol supplements: none Past cholesterol medications: atorvastatin Medication compliance: excellent compliance Aspirin: no Recent stressors: no Recurrent headaches: no Visual changes: no Palpitations: no Dyspnea: no Chest pain: no Lower extremity edema: no Dizzy/lightheaded: no  ABDOMINAL ISSUES Duration: chronic Nature: urgency and cramping Location: diffuse  Severity: moderate  Radiation: no Frequency: every day Treatments attempted: none Constipation: no Diarrhea: yes Episodes of diarrhea/day: couple of times a day with eating Mucous in the stool: no Heartburn: no Bloating:no Flatulence: yes Nausea: no Vomiting: no Melena or hematochezia: no Rash: no Jaundice: no Fever: no Weight loss: no   Relevant past medical, surgical, family and social history reviewed and updated as indicated. Interim medical history since our last visit reviewed. Allergies and medications reviewed and updated.  Review of Systems  Constitutional: Negative.   Respiratory: Negative.    Cardiovascular: Negative.   Gastrointestinal:  Positive for diarrhea. Negative for abdominal distention, abdominal pain, anal bleeding, blood in stool, constipation, nausea, rectal pain and vomiting.  Musculoskeletal: Negative.   Skin:  Negative.   Psychiatric/Behavioral: Negative.      Per HPI unless specifically indicated above     Objective:    BP 123/61 (BP Location: Right Arm, Patient Position: Sitting)   Pulse (!) 45   Temp (!) 87.6 F (30.9 C)   Wt 270 lb 3.2 oz (122.6 kg)   SpO2 98%   BMI 33.77 kg/m   Wt Readings from Last 3 Encounters:  07/01/23 270 lb 3.2 oz (122.6 kg)  05/07/23 267 lb 12.8 oz (121.5 kg)  04/07/23 253 lb 6.4 oz (114.9 kg)    Physical Exam Vitals and nursing note reviewed.  Constitutional:      General: He is not in acute distress.    Appearance: Normal appearance. He is not ill-appearing, toxic-appearing or diaphoretic.  HENT:     Head: Normocephalic and atraumatic.     Right Ear: External ear normal.     Left Ear: External ear normal.     Nose: Nose normal.     Mouth/Throat:     Mouth: Mucous membranes are moist.     Pharynx: Oropharynx is clear.  Eyes:     General: No scleral icterus.       Right eye: No discharge.        Left eye: No discharge.     Extraocular Movements: Extraocular movements intact.     Conjunctiva/sclera: Conjunctivae normal.     Pupils: Pupils are equal, round, and reactive to light.  Cardiovascular:     Rate and Rhythm: Normal rate and regular rhythm.     Pulses: Normal pulses.     Heart sounds: Normal heart sounds. No murmur heard.    No friction rub. No gallop.  Pulmonary:  Effort: Pulmonary effort is normal. No respiratory distress.     Breath sounds: Normal breath sounds. No stridor. No wheezing, rhonchi or rales.  Chest:     Chest wall: No tenderness.  Musculoskeletal:        General: Normal range of motion.     Cervical back: Normal range of motion and neck supple.  Skin:    General: Skin is warm and dry.     Capillary Refill: Capillary refill takes less than 2 seconds.     Coloration: Skin is not jaundiced or pale.     Findings: No bruising, erythema, lesion or rash.  Neurological:     General: No focal deficit present.      Mental Status: He is alert and oriented to person, place, and time. Mental status is at baseline.  Psychiatric:        Mood and Affect: Mood normal.        Behavior: Behavior normal.        Thought Content: Thought content normal.        Judgment: Judgment normal.     Results for orders placed or performed in visit on 04/07/23  CBC w/Diff   Collection Time: 04/07/23 12:01 PM  Result Value Ref Range   WBC 6.4 3.4 - 10.8 x10E3/uL   RBC 4.96 4.14 - 5.80 x10E6/uL   Hemoglobin 15.0 13.0 - 17.7 g/dL   Hematocrit 40.9 81.1 - 51.0 %   MCV 91 79 - 97 fL   MCH 30.2 26.6 - 33.0 pg   MCHC 33.4 31.5 - 35.7 g/dL   RDW 91.4 78.2 - 95.6 %   Platelets 226 150 - 450 x10E3/uL   Neutrophils 60 Not Estab. %   Lymphs 23 Not Estab. %   Monocytes 9 Not Estab. %   Eos 6 Not Estab. %   Basos 1 Not Estab. %   Neutrophils Absolute 3.9 1.4 - 7.0 x10E3/uL   Lymphocytes Absolute 1.5 0.7 - 3.1 x10E3/uL   Monocytes Absolute 0.6 0.1 - 0.9 x10E3/uL   EOS (ABSOLUTE) 0.4 0.0 - 0.4 x10E3/uL   Basophils Absolute 0.1 0.0 - 0.2 x10E3/uL   Immature Granulocytes 1 Not Estab. %   Immature Grans (Abs) 0.0 0.0 - 0.1 x10E3/uL  Comp Met (CMET)   Collection Time: 04/07/23 12:01 PM  Result Value Ref Range   Glucose 101 (H) 70 - 99 mg/dL   BUN 18 8 - 27 mg/dL   Creatinine, Ser 2.13 0.76 - 1.27 mg/dL   eGFR 63 >08 MV/HQI/6.96   BUN/Creatinine Ratio 15 10 - 24   Sodium 144 134 - 144 mmol/L   Potassium 3.5 3.5 - 5.2 mmol/L   Chloride 107 (H) 96 - 106 mmol/L   CO2 19 (L) 20 - 29 mmol/L   Calcium 9.1 8.6 - 10.2 mg/dL   Total Protein 6.7 6.0 - 8.5 g/dL   Albumin 4.3 3.8 - 4.8 g/dL   Globulin, Total 2.4 1.5 - 4.5 g/dL   Bilirubin Total 1.8 (H) 0.0 - 1.2 mg/dL   Alkaline Phosphatase 118 44 - 121 IU/L   AST 38 0 - 40 IU/L   ALT 59 (H) 0 - 44 IU/L  H. pylori antigen, stool   Collection Time: 04/13/23  6:20 AM  Result Value Ref Range   H pylori Ag, Stl Negative Negative  Cdiff NAA+O+P+Stool Culture   Collection Time:  04/13/23 10:24 AM   Specimen: Stool   ST  Result Value Ref Range   Salmonella/Shigella Screen Final  report    Stool Culture result 1 (RSASHR) Comment    Campylobacter Culture Final report    Stool Culture result 1 (CMPCXR) Comment    E coli, Shiga toxin Assay Negative Negative   OVA + PARASITE EXAM Final report    O&P result 1 Comment    Toxigenic C. Difficile by PCR Negative Negative      Assessment & Plan:   Problem List Items Addressed This Visit       Cardiovascular and Mediastinum   Essential hypertension - Primary   Under good control on current regimen. Continue current regimen. Continue to monitor. Call with any concerns. Refills given. Labs drawn today.        Relevant Medications   amLODipine (NORVASC) 5 MG tablet   apixaban (ELIQUIS) 5 MG TABS tablet   atorvastatin (LIPITOR) 80 MG tablet   Other Relevant Orders   Comprehensive metabolic panel   Aortic atherosclerosis (HCC)   Will keep BP and cholesterol under good control. Continue to monitor. Call with any concerns.       Relevant Medications   amLODipine (NORVASC) 5 MG tablet   apixaban (ELIQUIS) 5 MG TABS tablet   atorvastatin (LIPITOR) 80 MG tablet   Other Relevant Orders   Comprehensive metabolic panel   Persistent atrial fibrillation (HCC)   Stable. Continue to follow with cardiology. Refills of eliquis given today.       Relevant Medications   amLODipine (NORVASC) 5 MG tablet   apixaban (ELIQUIS) 5 MG TABS tablet   atorvastatin (LIPITOR) 80 MG tablet   Other Relevant Orders   CBC with Differential/Platelet   Comprehensive metabolic panel     Other   Hyperlipidemia LDL goal <70   Under good control on current regimen. Continue current regimen. Continue to monitor. Call with any concerns. Refills given. Labs drawn today.       Relevant Medications   amLODipine (NORVASC) 5 MG tablet   apixaban (ELIQUIS) 5 MG TABS tablet   atorvastatin (LIPITOR) 80 MG tablet   Other Relevant Orders    Comprehensive metabolic panel   Lipid Panel w/o Chol/HDL Ratio   Diarrhea   Stool studies negative previously. FOBT did not come back. Will check labs and start probiotics. Consider fiber if not getting better. Call with any concerns.       Relevant Orders   Magnesium   Lipase   Amylase   Phosphorus   Gliadin antibodies, serum   Tissue transglutaminase, IgA   Reticulin Antibody, IgA w reflex titer   TSH   Fecal occult blood, imunochemical([facility])     Follow up plan: Return in about 6 months (around 12/29/2023).

## 2023-07-01 NOTE — Telephone Encounter (Signed)
Ok for [facility] to review.   Left message for patient. Patient had labs done during visit today and one is not covered by his insurance. Reticulin antibody would need to sign an ABN.

## 2023-07-02 DIAGNOSIS — R140 Abdominal distension (gaseous): Secondary | ICD-10-CM | POA: Diagnosis not present

## 2023-07-02 DIAGNOSIS — R197 Diarrhea, unspecified: Secondary | ICD-10-CM | POA: Diagnosis not present

## 2023-07-02 NOTE — Addendum Note (Signed)
Addended by: Ginette Pitman on: 07/02/2023 11:28 AM   Modules accepted: Orders

## 2023-07-02 NOTE — Telephone Encounter (Signed)
Patient just came by and signed ABN form.

## 2023-07-02 NOTE — Telephone Encounter (Signed)
Spoke with patient. He is ok with proceeding with test an will be in the office today to sign the needed form.

## 2023-07-03 ENCOUNTER — Encounter: Payer: Self-pay | Admitting: Family Medicine

## 2023-07-04 LAB — CBC WITH DIFFERENTIAL/PLATELET
Basophils Absolute: 0.1 10*3/uL (ref 0.0–0.2)
Basos: 1 %
EOS (ABSOLUTE): 0.4 10*3/uL (ref 0.0–0.4)
Eos: 8 %
Hematocrit: 44.2 % (ref 37.5–51.0)
Hemoglobin: 14.9 g/dL (ref 13.0–17.7)
Immature Grans (Abs): 0 10*3/uL (ref 0.0–0.1)
Immature Granulocytes: 1 %
Lymphocytes Absolute: 1.4 10*3/uL (ref 0.7–3.1)
Lymphs: 23 %
MCH: 30.7 pg (ref 26.6–33.0)
MCHC: 33.7 g/dL (ref 31.5–35.7)
MCV: 91 fL (ref 79–97)
Monocytes Absolute: 0.5 10*3/uL (ref 0.1–0.9)
Monocytes: 8 %
Neutrophils Absolute: 3.5 10*3/uL (ref 1.4–7.0)
Neutrophils: 59 %
Platelets: 161 10*3/uL (ref 150–450)
RBC: 4.86 x10E6/uL (ref 4.14–5.80)
RDW: 13.8 % (ref 11.6–15.4)
WBC: 5.9 10*3/uL (ref 3.4–10.8)

## 2023-07-04 LAB — LIPID PANEL W/O CHOL/HDL RATIO
Cholesterol, Total: 144 mg/dL (ref 100–199)
HDL: 89 mg/dL (ref >39)
LDL Chol Calc (NIH): 40 mg/dL (ref 0–99)
Triglycerides: 75 mg/dL (ref 0–149)
VLDL Cholesterol Cal: 15 mg/dL (ref 5–40)

## 2023-07-04 LAB — COMPREHENSIVE METABOLIC PANEL
ALT: 53 [IU]/L — ABNORMAL HIGH (ref 0–44)
AST: 36 [IU]/L (ref 0–40)
Albumin: 4.4 g/dL (ref 3.8–4.8)
Alkaline Phosphatase: 126 [IU]/L — ABNORMAL HIGH (ref 44–121)
BUN/Creatinine Ratio: 12 (ref 10–24)
BUN: 15 mg/dL (ref 8–27)
Bilirubin Total: 2 mg/dL — ABNORMAL HIGH (ref 0.0–1.2)
CO2: 22 mmol/L (ref 20–29)
Calcium: 9.4 mg/dL (ref 8.6–10.2)
Chloride: 106 mmol/L (ref 96–106)
Creatinine, Ser: 1.24 mg/dL (ref 0.76–1.27)
Globulin, Total: 2.2 g/dL (ref 1.5–4.5)
Glucose: 104 mg/dL — ABNORMAL HIGH (ref 70–99)
Potassium: 4 mmol/L (ref 3.5–5.2)
Sodium: 142 mmol/L (ref 134–144)
Total Protein: 6.6 g/dL (ref 6.0–8.5)
eGFR: 61 mL/min/{1.73_m2} (ref >59)

## 2023-07-04 LAB — TSH: TSH: 1.63 u[IU]/mL (ref 0.450–4.500)

## 2023-07-04 LAB — TISSUE TRANSGLUTAMINASE, IGA: Transglutaminase IgA: <2 U/mL (ref 0–3)

## 2023-07-04 LAB — LIPASE: Lipase: 89 U/L — ABNORMAL HIGH (ref 13–78)

## 2023-07-04 LAB — GLIADIN ANTIBODIES, SERUM
Antigliadin Abs, IgA: 3 U (ref 0–19)
Gliadin IgG: 2 U (ref 0–19)

## 2023-07-04 LAB — PHOSPHORUS: Phosphorus: 3.2 mg/dL (ref 2.8–4.1)

## 2023-07-04 LAB — RETICULIN ANTIBODIES, IGA W TITER: Reticulin Ab, IgA: NEGATIVE {titer} (ref <2.5)

## 2023-07-04 LAB — AMYLASE: Amylase: 67 U/L (ref 31–110)

## 2023-07-04 LAB — MAGNESIUM: Magnesium: 1.9 mg/dL (ref 1.6–2.3)

## 2023-07-13 ENCOUNTER — Other Ambulatory Visit: Payer: Self-pay | Admitting: Family Medicine

## 2023-07-13 DIAGNOSIS — R197 Diarrhea, unspecified: Secondary | ICD-10-CM | POA: Diagnosis not present

## 2023-07-14 ENCOUNTER — Telehealth: Payer: Self-pay

## 2023-07-14 NOTE — Telephone Encounter (Signed)
 Copied from CRM [id number]. Topic: Clinical - Medical Advice >> Jul 14, 2023  1:12 PM Yolanda T wrote: Reason for CRM: patient called stated he picked up a test kit and its asking for a test requisition and he does not remember getting one. He collected the specimen today and would like a call back as he wants to mail it asap

## 2023-07-14 NOTE — Telephone Encounter (Signed)
 Returned call to patient and email the order to him as we discussed.

## 2023-07-22 ENCOUNTER — Encounter: Payer: Self-pay | Admitting: Family Medicine

## 2023-07-22 LAB — FECAL OCCULT BLOOD, IMMUNOCHEMICAL: Fecal Occult Bld: NEGATIVE

## 2023-09-23 ENCOUNTER — Ambulatory Visit: Payer: Medicare Other | Admitting: Urology

## 2023-10-01 ENCOUNTER — Other Ambulatory Visit: Payer: Self-pay | Admitting: Urology

## 2023-10-01 DIAGNOSIS — N200 Calculus of kidney: Secondary | ICD-10-CM

## 2023-10-02 ENCOUNTER — Ambulatory Visit
Admission: RE | Admit: 2023-10-02 | Discharge: 2023-10-02 | Disposition: A | Discharge status: Home / Self Care | Source: Ambulatory Visit | Attending: Urology | Admitting: Urology

## 2023-10-02 ENCOUNTER — Ambulatory Visit
Admission: RE | Admit: 2023-10-02 | Discharge: 2023-10-02 | Disposition: A | Discharge status: Home / Self Care | Attending: Urology | Admitting: Urology

## 2023-10-02 ENCOUNTER — Encounter: Payer: Self-pay | Admitting: Urology

## 2023-10-02 ENCOUNTER — Ambulatory Visit (INDEPENDENT_AMBULATORY_CARE_PROVIDER_SITE_OTHER): Admitting: Urology

## 2023-10-02 VITALS — BP 136/81 | HR 51 | Ht 75.0 in | Wt 265.0 lb

## 2023-10-02 DIAGNOSIS — N400 Enlarged prostate without lower urinary tract symptoms: Secondary | ICD-10-CM

## 2023-10-02 DIAGNOSIS — R109 Unspecified abdominal pain: Secondary | ICD-10-CM | POA: Diagnosis not present

## 2023-10-02 DIAGNOSIS — N200 Calculus of kidney: Secondary | ICD-10-CM

## 2023-10-05 NOTE — Progress Notes (Signed)
 10/05/23 9:29 AM   Zachary Rogers 11-20-1950 [id number]  Referring provider:  Solomon Dupre, DO [address] [place],  [place] [address]  Urological history:  1. Nephrolithiasis  -spontaneous passage of a 4 mm right UVJ stone in 2019  -KUB (09/2022) bilateral lower pole nephrolithiasis with largest ~ 4 mm in size     2. BPH  -PSA (12/2022) 3.3  HPI: Zachary Rogers is a 73 y.o.male who presents today for a 1 year follow-up.     Previous records reviewed.     He has been doing well over the last year urologically speaking.  He has had no episodes of renal colic.  Patient denies any modifying or aggravating factors.  Patient denies any recent UTI's, gross hematuria, dysuria or suprapubic/flank pain.  Patient denies any fevers, chills, nausea or vomiting.    KUB 5 mm left renal stone  PMH: Past Medical History:  Diagnosis Date   Actinic keratosis    R lat neck - bx proven with verruca vulgaris   Cerebrovascular accident (CVA) (HCC) 10/25/2020   Dysplastic nevus 06/22/2017   L mid back parapinal -severe, excision 10/06/2017   Hypertension    Lateral epicondylitis (tennis elbow) 09/04/2020   Mixed hyperlipidemia 07/18/2019   Nephrolithiasis     Surgical History: Past Surgical History:  Procedure Laterality Date   BUBBLE STUDY  12/17/2020   Procedure: BUBBLE STUDY;  Surgeon: Elmyra Haggard, MD;  Location: [facility] ENDOSCOPY;  Service: Cardiovascular;;   CARDIOVERSION N/A 04/30/2021   Procedure: CARDIOVERSION;  Surgeon: Sammy Crisp, MD;  Location: [facility] ORS;  Service: Cardiovascular;  Laterality: N/A;   CARDIOVERSION N/A 12/24/2021   Procedure: CARDIOVERSION;  Surgeon: Wenona Hamilton, MD;  Location: [facility] ORS;  Service: Cardiovascular;  Laterality: N/A;   CHOLECYSTECTOMY  06/07/1998   COLONOSCOPY     COLONOSCOPY WITH PROPOFOL  N/A 11/09/2018   Procedure: COLONOSCOPY WITH PROPOFOL ;  Surgeon: Luke Salaam, MD;  Location: [facility] ENDOSCOPY;  Service: Gastroenterology;   Laterality: N/A;   PATENT FORAMEN OVALE(PFO) CLOSURE N/A 02/13/2021   Procedure: PATENT FORAMEN OVALE (PFO) CLOSURE;  Surgeon: Arnoldo Lapping, MD;  Location: [facility] INVASIVE CV LAB;  Service: Cardiovascular;  Laterality: N/A;   SKIN LESION EXCISION     dermatlogy - precancerous lesion 1 year ago on back    TEE WITHOUT CARDIOVERSION N/A 12/17/2020   Procedure: TRANSESOPHAGEAL ECHOCARDIOGRAM (TEE);  Surgeon: Elmyra Haggard, MD;  Location: [facility] ENDOSCOPY;  Service: Cardiovascular;  Laterality: N/A;   TOE SURGERY Bilateral     Home Medications:  Allergies as of 10/02/2023       Reactions   Amoxicillin  Rash        Medication List        Accurate as of Oct 02, 2023 11:59 PM. If you have any questions, ask your nurse or doctor.          amLODipine  5 MG tablet Commonly known as: NORVASC  Take 1.5 tablets (7.5 mg total) by mouth daily.   apixaban  5 MG Tabs tablet Commonly known as: Eliquis  Take 1 tablet (5 mg total) by mouth 2 (two) times daily.   atorvastatin  80 MG tablet Commonly known as: LIPITOR Take 1 tablet (80 mg total) by mouth daily.   multivitamin capsule Take 1 capsule by mouth daily.        Allergies:  Allergies  Allergen Reactions   Amoxicillin  Rash    Family History: Family History  Problem Relation Age of Onset   Lung cancer  Mother    Hypertension Father    Diabetes Father    Transient ischemic attack Father    Heart attack Maternal Grandfather    Prostate cancer Neg Hx    Bladder Cancer Neg Hx    Kidney cancer Neg Hx     Social History:  reports that he has been smoking cigars and cigarettes. He has never used smokeless tobacco. He reports current alcohol use of about 5.0 standard drinks of alcohol per week. He reports that he does not use drugs.   Physical Exam: BP 136/81   Pulse (!) 51   Ht 6\' 3"  (1.905 m)   Wt 265 lb (120.2 kg)   BMI 33.12 kg/m   Constitutional:  Well nourished. Alert and oriented, No acute distress. HEENT: [place] AT, moist  mucus membranes.  Trachea midline Cardiovascular: No clubbing, cyanosis, or edema. Respiratory: Normal respiratory effort, no increased work of breathing. Neurologic: Grossly intact, no focal deficits, moving all 4 extremities. Psychiatric: Normal mood and affect.   Laboratory Data: Lab Results  Component Value Date   CREATININE 1.24 07/01/2023  I have reviewed the labs.   Pertinent Imaging: KUB 5 mm left lower pole stone I have independently reviewed the films.  See HPI.  Radiologist interpretation still pending  Assessment & Plan:    1. Bilateral stones, seen on 2021 CT -Small stone in the lower pole of left kidney which are stable -Continue to monitor with yearly KUBs  2. BPH -PSA stable  Return in about 1 year (around 10/01/2024) for KUB and office visit .  Briant Camper   [facility] [address], [address] [place], [place] [address] [phone]

## 2023-10-08 ENCOUNTER — Ambulatory Visit: Payer: Self-pay | Admitting: Emergency Medicine

## 2023-10-08 VITALS — Ht 75.0 in | Wt 260.0 lb

## 2023-10-08 DIAGNOSIS — Z0000 Encounter for general adult medical examination without abnormal findings: Secondary | ICD-10-CM

## 2023-10-08 DIAGNOSIS — Z1211 Encounter for screening for malignant neoplasm of colon: Secondary | ICD-10-CM | POA: Diagnosis not present

## 2023-10-08 NOTE — Progress Notes (Signed)
 Subjective:   Zachary Rogers is a 73 y.o. who presents for a Medicare Wellness preventive visit.  As a reminder, Annual Wellness Visits don't include a physical exam, and some assessments may be limited, especially if this visit is performed virtually. We may recommend an in-person follow-up visit with your provider if needed.  Visit Complete: Virtual I connected with  [name] on 10/08/23 by a audio enabled telemedicine application and verified that I am speaking with the correct person using two identifiers.  Patient Location: Home  Provider Location: Home Office  I discussed the limitations of evaluation and management by telemedicine. The patient expressed understanding and agreed to proceed.  Vital Signs: Because this visit was a virtual/telehealth visit, some criteria may be missing or patient reported. Any vitals not documented were not able to be obtained and vitals that have been documented are patient reported.  VideoDeclined- This patient declined Librarian, academic. Therefore the visit was completed with audio only.  Persons Participating in Visit: Patient.  AWV Questionnaire: Yes: Patient Medicare AWV questionnaire was completed by the patient on 10/07/23; I have confirmed that all information answered by patient is correct and no changes since this date.  Cardiac Risk Factors include: advanced age (>18men, >52 women);male gender;hypertension;dyslipidemia;obesity (BMI >30kg/m2)     Objective:     Today's Vitals   10/08/23 1307  Weight: 260 lb (117.9 kg)  Height: 6\' 3"  (1.905 m)   Body mass index is 32.5 kg/m.     10/08/2023    1:19 PM 09/29/2022    1:06 PM 02/13/2021    7:21 AM 12/17/2020    8:08 AM 07/23/2020    9:03 AM 07/18/2019    9:07 AM 11/09/2018    9:07 AM  Advanced Directives  Does Patient Have a Medical Advance Directive? Yes No No Yes Yes Yes Yes  Type of Estate agent of [place];Living will    Healthcare Power of [employer] of [place];Living will Living will;Healthcare Power of [employer] Power of [place];Living will;Out of facility DNR (pink MOST or yellow form)  Does patient want to make changes to medical advance directive? No - Patient declined        Copy of Healthcare Power of Attorney in Chart? No - copy requested   No - copy requested No - copy requested No - copy requested No - copy requested  Would patient like information on creating a medical advance directive?  No - Patient declined Yes (MAU/Ambulatory/Procedural Areas - Information given)        Current Medications (verified) Outpatient Encounter Medications as of 10/08/2023  Medication Sig   amLODipine  (NORVASC ) 5 MG tablet Take 1.5 tablets (7.5 mg total) by mouth daily.   apixaban  (ELIQUIS ) 5 MG TABS tablet Take 1 tablet (5 mg total) by mouth 2 (two) times daily.   atorvastatin  (LIPITOR) 80 MG tablet Take 1 tablet (80 mg total) by mouth daily.   Multiple Vitamin (MULTIVITAMIN) capsule Take 1 capsule by mouth daily.   No facility-administered encounter medications on file as of 10/08/2023.    Allergies (verified) Amoxicillin    History: Past Medical History:  Diagnosis Date   Actinic keratosis    R lat neck - bx proven with verruca vulgaris   Cerebrovascular accident (CVA) (HCC) 10/25/2020   Dysplastic nevus 06/22/2017   L mid back parapinal -severe, excision 10/06/2017   Hypertension    Lateral epicondylitis (tennis elbow) 09/04/2020   Mixed hyperlipidemia 07/18/2019  Nephrolithiasis    Past Surgical History:  Procedure Laterality Date   BUBBLE STUDY  12/17/2020   Procedure: BUBBLE STUDY;  Surgeon: Elmyra Haggard, MD;  Location: [facility] ENDOSCOPY;  Service: Cardiovascular;;   CARDIOVERSION N/A 04/30/2021   Procedure: CARDIOVERSION;  Surgeon: Sammy Crisp, MD;  Location: [facility] ORS;  Service: Cardiovascular;  Laterality: N/A;   CARDIOVERSION N/A 12/24/2021   Procedure:  CARDIOVERSION;  Surgeon: Wenona Hamilton, MD;  Location: [facility] ORS;  Service: Cardiovascular;  Laterality: N/A;   CHOLECYSTECTOMY  06/07/1998   COLONOSCOPY     COLONOSCOPY WITH PROPOFOL  N/A 11/09/2018   Procedure: COLONOSCOPY WITH PROPOFOL ;  Surgeon: Luke Salaam, MD;  Location: [facility] ENDOSCOPY;  Service: Gastroenterology;  Laterality: N/A;   PATENT FORAMEN OVALE(PFO) CLOSURE N/A 02/13/2021   Procedure: PATENT FORAMEN OVALE (PFO) CLOSURE;  Surgeon: Arnoldo Lapping, MD;  Location: [facility] INVASIVE CV LAB;  Service: Cardiovascular;  Laterality: N/A;   SKIN LESION EXCISION     dermatlogy - precancerous lesion 1 year ago on back    TEE WITHOUT CARDIOVERSION N/A 12/17/2020   Procedure: TRANSESOPHAGEAL ECHOCARDIOGRAM (TEE);  Surgeon: Elmyra Haggard, MD;  Location: [facility] ENDOSCOPY;  Service: Cardiovascular;  Laterality: N/A;   TOE SURGERY Bilateral    Family History  Problem Relation Age of Onset   Lung cancer Mother    Hypertension Father    Diabetes Father    Transient ischemic attack Father    Heart attack Maternal Grandfather    Prostate cancer Neg Hx    Bladder Cancer Neg Hx    Kidney cancer Neg Hx    Social History   Socioeconomic History   Marital status: Married    Spouse name: Terri   Number of children: 3   Years of education: Not on file   Highest education level: Master's degree (e.g., MA, MS, MEng, MEd, MSW, MBA)  Occupational History   Occupation: working fulltime    Occupation: retired  Tobacco Use   Smoking status: Some Days    Types: Cigars    Passive exposure: Past   Smokeless tobacco: Never   Tobacco comments:    social smoker when younger, I have a cigar every now and then.  Vaping Use   Vaping status: Never Used  Substance and Sexual Activity   Alcohol use: Yes    Alcohol/week: 5.0 standard drinks of alcohol    Types: 5 Glasses of wine per week    Comment: 1 glass of wine with dinner 5 x per week   Drug use: No   Sexual activity: Yes    Birth control/protection:  Surgical  Other Topics Concern   Not on file  Social History Licensed conveyancer, local friends.    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   Social Drivers of Corporate investment banker Strain: Low Risk  (10/08/2023)   Overall Financial Resource Strain (CARDIA)    Difficulty of Paying Living Expenses: Not hard at all  Food Insecurity: No Food Insecurity (10/08/2023)   Hunger Vital Sign    Worried About Running Out of Food in the Last Year: Never true    Ran Out of Food in the Last Year: Never true  Transportation Needs: No Transportation Needs (10/08/2023)   PRAPARE - Administrator, Civil Service (Medical): No    Lack of Transportation (Non-Medical): No  Physical Activity: Sufficiently Active (10/08/2023)   Exercise Vital Sign    Days of Exercise per Week: 2 days    Minutes of Exercise  per Session: 90 min  Stress: No Stress Concern Present (10/08/2023)   [employer] of Occupational Health - Occupational Stress Questionnaire    Feeling of Stress : Not at all  Social Connections: Moderately Integrated (10/08/2023)   Social Connection and Isolation Panel [NHANES]    Frequency of Communication with Friends and Family: More than three times a week    Frequency of Social Gatherings with Friends and Family: More than three times a week    Attends Religious Services: Never    Database administrator or Organizations: Yes    Attends Engineer, structural: More than 4 times per year    Marital Status: Married    Tobacco Counseling Ready to quit: Not Answered Counseling given: Not Answered Tobacco comments: social smoker when younger, I have a cigar every now and then.    Clinical Intake:  Pre-visit preparation completed: Yes  Pain : No/denies pain     BMI - recorded: 32.5 Nutritional Status: BMI > 30  Obese Nutritional Risks: None Diabetes: No  No results found for: "HGBA1C"   How often do you need to have someone help you when you read  instructions, pamphlets, or other written materials from your doctor or pharmacy?: 1 - Never  Interpreter Needed?: No  Information entered by :: Jaunita Messier, CMA   Activities of Daily Living     10/08/2023    1:09 PM 10/07/2023   10:14 AM  In your present state of health, do you have any difficulty performing the following activities:  Hearing? 0 0  Vision? 0 0  Difficulty concentrating or making decisions? 0 0  Walking or climbing stairs? 0 0  Dressing or bathing? 0 0  Doing errands, shopping? 0 0  Preparing Food and eating ? N N  Using the Toilet? N N  In the past six months, have you accidently leaked urine? N N  Do you have problems with loss of bowel control? N N  Managing your Medications? N N  Managing your Finances? N N  Housekeeping or managing your Housekeeping? N N    Patient Care Team: Solomon Dupre, DO as PCP - General (Family Medicine) End, Veryl Gottron, MD as PCP - Cardiology (Cardiology) McGowan, Shannon A, PA-C as Physician Assistant (Urology) Elta Halter, MD (Dermatology) Bufford Carne, MD as Referring Physician (Neurology)  Indicate any recent Medical Services you may have received from other than [facility] providers in the past year (date may be approximate).     Assessment:    This is a routine wellness examination for [place].  Hearing/Vision screen Hearing Screening - Comments:: Denies hearing loss Vision Screening - Comments:: Gets routine eye exam, changing to new eye dr   Goals Addressed               This Visit's Progress     Increase physical activity (pt-stated)         Depression Screen     10/08/2023    1:17 PM 07/01/2023    9:10 AM 04/07/2023   11:04 AM 12/29/2022    8:27 AM 09/29/2022    1:05 PM 07/29/2022    8:26 AM 06/30/2022    8:26 AM  PHQ 2/9 Scores  PHQ - 2 Score 0 0 0 0 0 0 0  PHQ- 9 Score 0 0 0 0 0 0 0    Fall Risk     10/08/2023    1:21 PM 10/07/2023   10:14 AM 07/01/2023  9:10 AM 12/29/2022    8:27  AM 09/29/2022    1:09 PM  Fall Risk   Falls in the past year? 0 0 0 0 0  Number falls in past yr: 0  0 0 0  Injury with Fall? 0  0  0  Risk for fall due to : No Fall Risks  No Fall Risks No Fall Risks No Fall Risks  Follow up Falls evaluation completed  Falls evaluation completed Falls evaluation completed Falls prevention discussed;Falls evaluation completed    MEDICARE RISK AT HOME:  Medicare Risk at Home Any stairs in or around the home?: Yes If so, are there any without handrails?: No Home free of loose throw rugs in walkways, pet beds, electrical cords, etc?: Yes Adequate lighting in your home to reduce risk of falls?: Yes Life alert?: No Use of a cane, walker or w/c?: No Grab bars in the bathroom?: No Shower chair or bench in shower?: No Elevated toilet seat or a handicapped toilet?: Yes  TIMED UP AND GO:  Was the test performed?  No  Cognitive Function: 6CIT completed        10/08/2023    1:22 PM 09/29/2022    1:12 PM 07/23/2020    9:06 AM 07/18/2019    9:08 AM 07/12/2018   10:16 AM  6CIT Screen  What Year? 0 points 0 points 0 points 0 points 0 points  What month? 0 points 0 points 0 points 0 points 0 points  What time? 0 points 0 points 0 points 0 points 0 points  Count back from 20 0 points 0 points 0 points 0 points 0 points  Months in reverse 0 points 0 points 0 points 0 points 0 points  Repeat phrase 0 points 0 points 2 points 4 points 0 points  Total Score 0 points 0 points 2 points 4 points 0 points    Immunizations Immunization History  Administered Date(s) Administered   Fluad Quad(high Dose 65+) 02/13/2020   Influenza, High Dose Seasonal PF 05/06/2017, 04/07/2018, 03/15/2019   Influenza,inj,Quad PF,6+ Mos 06/19/2014, 03/27/2015   Influenza-Unspecified 05/06/2017, 03/15/2019, 03/25/2021   PFIZER(Purple Top)SARS-COV-2 Vaccination 06/11/2019, 07/07/2019, 02/27/2020, 09/28/2020   Pneumococcal Conjugate-13 06/19/2014   Pneumococcal Polysaccharide-23  06/20/2015, 07/23/2015   Tdap 06/19/2014   Zoster, Live 06/19/2014    Screening Tests Health Maintenance  Topic Date Due   Zoster Vaccines- Shingrix (1 of 2) 08[birth year]08/1969   COVID-19 Vaccine (5 - 2024-25 season) 01/18/2023   Colonoscopy  11/09/2023   INFLUENZA VACCINE  12/18/2023   DTaP/Tdap/Td (2 - Td or Tdap) 06/19/2024   Medicare Annual Wellness (AWV)  10/07/2024   Pneumonia Vaccine 45+ Years old  Completed   Hepatitis C Screening  Completed   HPV VACCINES  Aged Out   Meningococcal B Vaccine  Aged Out    Health Maintenance  Health Maintenance Due  Topic Date Due   Zoster Vaccines- Shingrix (1 of 2) 08[birth year]08/1967   COVID-19 Vaccine (5 - 2024-25 season) 01/18/2023   Colonoscopy  11/09/2023   Health Maintenance Items Addressed: Referral sent to GI for colonoscopy, See Nurse Notes  Additional Screening:  Vision Screening: Recommended annual ophthalmology exams for early detection of glaucoma and other disorders of the eye.  Dental Screening: Recommended annual dental exams for proper oral hygiene  Community Resource Referral / Chronic Care Management: CRR required this visit?  No   CCM required this visit?  No   Plan:    I have personally reviewed and noted the following  in the patient's chart:   Medical and social history Use of alcohol, tobacco or illicit drugs  Current medications and supplements including opioid prescriptions. Patient is not currently taking opioid prescriptions. Functional ability and status Nutritional status Physical activity Advanced directives List of other physicians Hospitalizations, surgeries, and ER visits in previous 12 months Vitals Screenings to include cognitive, depression, and falls Referrals and appointments  In addition, I have reviewed and discussed with patient certain preventive protocols, quality metrics, and best practice recommendations. A written personalized care plan for preventive services as well as general  preventive health recommendations were provided to patient.   Jaunita Messier, CMA   10/08/2023   After Visit Summary: (MyChart) Due to this being a telephonic visit, the after visit summary with patients personalized plan was offered to patient via MyChart   Notes: Please refer to Routing Comments.

## 2023-10-08 NOTE — Patient Instructions (Signed)
 [name] , Thank you for taking time out of your busy schedule to complete your Annual Wellness Visit with me. I enjoyed our conversation and look forward to speaking with you again next year. I, as well as your care team,  appreciate your ongoing commitment to your health goals. Please review the following plan we discussed and let me know if I can assist you in the future. Your Game plan/ To Do List    Referrals: I have placed a referral to [facility] to have your colonoscopy (due around 11/09/23) There ph # is [phone]. If you haven't heard from the office you've been referred to, please reach out to them at the phone provided.   Follow up Visits: Next Medicare AWV with our clinical staff: 10/20/24 @ 1:10pm (phone visit)   Have you seen your provider in the last 6 months (3 months if uncontrolled diabetes)? Yes Next Office Visit with your provider: 01/06/24 @ 9:00am with Dr. Lincoln Renshaw  Clinician Recommendations: Call to schedule at routine eye exam at your convenience. Aim for 30 minutes of exercise or brisk walking, 6-8 glasses of water, and 5 servings of fruits and vegetables each day.       This is a list of the screening recommended for you and due dates:  Health Maintenance  Topic Date Due   Zoster (Shingles) Vaccine (1 of 2) 08/08/1969   COVID-19 Vaccine (5 - 2024-25 season) 01/18/2023   Colon Cancer Screening  11/09/2023   Flu Shot  12/18/2023   DTaP/Tdap/Td vaccine (2 - Td or Tdap) 06/19/2024   Medicare Annual Wellness Visit  10/07/2024   Pneumonia Vaccine  Completed   Hepatitis C Screening  Completed   HPV Vaccine  Aged Out   Meningitis B Vaccine  Aged Out    Advanced directives: (Copy Requested) Please bring a copy of your health care power of attorney and living will to the office to be added to your chart at your convenience. You can mail to [facility] [address] [place], [place] [address] or email to [email or web address] Advance Care  Planning is important because it:  [x]  Makes sure you receive the medical care that is consistent with your values, goals, and preferences  [x]  It provides guidance to your family and loved ones and reduces their decisional burden about whether or not they are making the right decisions based on your wishes.  Follow the link provided in your after visit summary or read over the paperwork we have mailed to you to help you started getting your Advance Directives in place. If you need assistance in completing these, please reach out to us  so that we can help you!  See attachments for Preventive Care and Fall Prevention Tips.   Fall Prevention in the Home, Adult Falls can cause injuries and affect people of all ages. There are many simple things that you can do to make your home safe and to help prevent falls. If you need it, ask for help making these changes. What actions can I take to prevent falls? General information Use good lighting in all rooms. Make sure to: Replace any light bulbs that burn out. Turn on lights if it is dark and use night-lights. Keep items that you use often in easy-to-reach places. Lower the shelves around your home if needed. Move furniture so that there are clear paths around it. Do not keep throw rugs or other things on the floor that can make you trip.  If any of your floors are uneven, fix them. Add color or contrast paint or tape to clearly mark and help you see: Grab bars or handrails. First and last steps of staircases. Where the edge of each step is. If you use a ladder or stepladder: Make sure that it is fully opened. Do not climb a closed ladder. Make sure the sides of the ladder are locked in place. Have someone hold the ladder while you use it. Know where your pets are as you move through your home. What can I do in the bathroom?     Keep the floor dry. Clean up any water that is on the floor right away. Remove soap buildup in the bathtub or  shower. Buildup makes bathtubs and showers slippery. Use non-skid mats or decals on the floor of the bathtub or shower. Attach bath mats securely with double-sided, non-slip rug tape. If you need to sit down while you are in the shower, use a non-slip stool. Install grab bars by the toilet and in the bathtub and shower. Do not use towel bars as grab bars. What can I do in the bedroom? Make sure that you have a light by your bed that is easy to reach. Do not use any sheets or blankets on your bed that hang to the floor. Have a firm bench or chair with side arms that you can use for support when you get dressed. What can I do in the kitchen? Clean up any spills right away. If you need to reach something above you, use a sturdy step stool that has a grab bar. Keep electrical cables out of the way. Do not use floor polish or wax that makes floors slippery. What can I do with my stairs? Do not leave anything on the stairs. Make sure that you have a light switch at the top and the bottom of the stairs. Have them installed if you do not have them. Make sure that there are handrails on both sides of the stairs. Fix handrails that are broken or loose. Make sure that handrails are as long as the staircases. Install non-slip stair treads on all stairs in your home if they do not have carpet. Avoid having throw rugs at the top or bottom of stairs, or secure the rugs with carpet tape to prevent them from moving. Choose a carpet design that does not hide the edge of steps on the stairs. Make sure that carpet is firmly attached to the stairs. Fix any carpet that is loose or worn. What can I do on the outside of my home? Use bright outdoor lighting. Repair the edges of walkways and driveways and fix any cracks. Clear paths of anything that can make you trip, such as tools or rocks. Add color or contrast paint or tape to clearly mark and help you see high doorway thresholds. Trim any bushes or trees on the  main path into your home. Check that handrails are securely fastened and in good repair. Both sides of all steps should have handrails. Install guardrails along the edges of any raised decks or porches. Have leaves, snow, and ice cleared regularly. Use sand, salt, or ice melt on walkways during winter months if you live where there is ice and snow. In the garage, clean up any spills right away, including grease or oil spills. What other actions can I take? Review your medicines with your health care provider. Some medicines can make you confused or feel dizzy. This can  increase your chance of falling. Wear closed-toe shoes that fit well and support your feet. Wear shoes that have rubber soles and low heels. Use a cane, walker, scooter, or crutches that help you move around if needed. Talk with your provider about other ways that you can decrease your risk of falls. This may include seeing a physical therapist to learn to do exercises to improve movement and strength. Where to find more information Centers for Disease Control and Prevention, STEADI: TonerPromos.no [employer] on Aging: BaseRingTones.pl National Institute on Aging: BaseRingTones.pl Contact a health care provider if: You are afraid of falling at home. You feel weak, drowsy, or dizzy at home. You fall at home. Get help right away if you: Lose consciousness or have trouble moving after a fall. Have a fall that causes a head injury. These symptoms may be an emergency. Get help right away. Call 911. Do not wait to see if the symptoms will go away. Do not drive yourself to the hospital. This information is not intended to replace advice given to you by your health care provider. Make sure you discuss any questions you have with your health care provider. Document Revised: 01/06/2022 Document Reviewed: 01/06/2022 Elsevier Patient Education  2024 [employer].

## 2023-10-14 ENCOUNTER — Encounter: Payer: Self-pay | Admitting: Dermatology

## 2023-10-14 ENCOUNTER — Ambulatory Visit: Payer: Medicare Other | Admitting: Dermatology

## 2023-10-14 DIAGNOSIS — L918 Other hypertrophic disorders of the skin: Secondary | ICD-10-CM

## 2023-10-14 DIAGNOSIS — L578 Other skin changes due to chronic exposure to nonionizing radiation: Secondary | ICD-10-CM

## 2023-10-14 DIAGNOSIS — L570 Actinic keratosis: Secondary | ICD-10-CM

## 2023-10-14 DIAGNOSIS — D229 Melanocytic nevi, unspecified: Secondary | ICD-10-CM

## 2023-10-14 DIAGNOSIS — W908XXA Exposure to other nonionizing radiation, initial encounter: Secondary | ICD-10-CM

## 2023-10-14 DIAGNOSIS — Z86018 Personal history of other benign neoplasm: Secondary | ICD-10-CM | POA: Diagnosis not present

## 2023-10-14 DIAGNOSIS — L814 Other melanin hyperpigmentation: Secondary | ICD-10-CM

## 2023-10-14 DIAGNOSIS — L820 Inflamed seborrheic keratosis: Secondary | ICD-10-CM

## 2023-10-14 DIAGNOSIS — L821 Other seborrheic keratosis: Secondary | ICD-10-CM | POA: Diagnosis not present

## 2023-10-14 DIAGNOSIS — D1801 Hemangioma of skin and subcutaneous tissue: Secondary | ICD-10-CM

## 2023-10-14 DIAGNOSIS — Z1283 Encounter for screening for malignant neoplasm of skin: Secondary | ICD-10-CM

## 2023-10-14 NOTE — Patient Instructions (Addendum)

## 2023-10-14 NOTE — Progress Notes (Signed)
 Follow-Up Visit   Subjective  Zachary Rogers is a 73 y.o. male who presents for the following: Skin Cancer Screening and Full Body Skin Exam hx of Dysplastic Nevus, hx of Aks, growths L sideburn, come and go  The patient presents for Total-Body Skin Exam (TBSE) for skin cancer screening and mole check. The patient has spots, moles and lesions to be evaluated, some may be new or changing and the patient may have concern these could be cancer.  The following portions of the chart were reviewed this encounter and updated as appropriate: medications, allergies, medical history  Review of Systems:  No other skin or systemic complaints except as noted in HPI or Assessment and Plan.  Objective  Well appearing patient in no apparent distress; mood and affect are within normal limits.  A full examination was performed including scalp, head, eyes, ears, nose, lips, neck, chest, axillae, abdomen, back, buttocks, bilateral upper extremities, bilateral lower extremities, hands, feet, fingers, toes, fingernails, and toenails. All findings within normal limits unless otherwise noted below.   Relevant physical exam findings are noted in the Assessment and Plan.  L zygoma x 1 Pink scaly macules L sideburn x 1, L cheek x 1, L upper arm x 1 (3) Stuck on waxy paps with erythema  Assessment & Plan   SKIN CANCER SCREENING PERFORMED TODAY.  ACTINIC DAMAGE - Chronic condition, secondary to cumulative UV/sun exposure - diffuse scaly erythematous macules with underlying dyspigmentation - Recommend daily broad spectrum sunscreen SPF 30+ to sun-exposed areas, reapply every 2 hours as needed.  - Staying in the shade or wearing long sleeves, sun glasses (UVA+UVB protection) and wide brim hats (4-inch brim around the entire circumference of the hat) are also recommended for sun protection.  - Call for new or changing lesions.  LENTIGINES, SEBORRHEIC KERATOSES, HEMANGIOMAS - Benign normal skin lesions -  Benign-appearing - Call for any changes  MELANOCYTIC NEVI - Tan-brown and/or pink-flesh-colored symmetric macules and papules - Benign appearing on exam today - Observation - Call clinic for new or changing moles - Recommend daily use of broad spectrum spf 30+ sunscreen to sun-exposed areas.   HISTORY OF DYSPLASTIC NEVUS No evidence of recurrence today Recommend regular full body skin exams Recommend daily broad spectrum sunscreen SPF 30+ to sun-exposed areas, reapply every 2 hours as needed.  Call if any new or changing lesions are noted between office visits  - L mid back parspinal  Acrochordons (Skin Tags) Axilla  - Fleshy, skin-colored pedunculated papules - Benign appearing.  - Observe. - If desired, they can be removed with an in office procedure that is not covered by insurance. - Please call the clinic if you notice any new or changing lesions.   AK (ACTINIC KERATOSIS) L zygoma x 1 Actinic keratoses are precancerous spots that appear secondary to cumulative UV radiation exposure/sun exposure over time. They are chronic with expected duration over 1 year. A portion of actinic keratoses will progress to squamous cell carcinoma of the skin. It is not possible to reliably predict which spots will progress to skin cancer and so treatment is recommended to prevent development of skin cancer.  Recommend daily broad spectrum sunscreen SPF 30+ to sun-exposed areas, reapply every 2 hours as needed.  Recommend staying in the shade or wearing long sleeves, sun glasses (UVA+UVB protection) and wide brim hats (4-inch brim around the entire circumference of the hat). Call for new or changing lesions. Destruction of lesion - L zygoma x 1 Complexity:  simple   Destruction method: cryotherapy   Informed consent: discussed and consent obtained   Timeout:  patient name, date of birth, surgical site, and procedure verified Lesion destroyed using liquid nitrogen: Yes   Region frozen until ice  ball extended beyond lesion: Yes   Outcome: patient tolerated procedure well with no complications   Post-procedure details: wound care instructions given   INFLAMED SEBORRHEIC KERATOSIS (3) L sideburn x 1, L cheek x 1, L upper arm x 1 (3) Symptomatic, irritating, patient would like treated. Destruction of lesion - L sideburn x 1, L cheek x 1, L upper arm x 1 (3) Complexity: simple   Destruction method: cryotherapy   Informed consent: discussed and consent obtained   Timeout:  patient name, date of birth, surgical site, and procedure verified Lesion destroyed using liquid nitrogen: Yes   Region frozen until ice ball extended beyond lesion: Yes   Outcome: patient tolerated procedure well with no complications   Post-procedure details: wound care instructions given    Return in about 1 year (around 10/13/2024) for TBSE, Hx of Dysplastic nevi, Hx of AKs.  I, Rollie Clipper, RMA, am acting as scribe for Celine Collard, MD .   Documentation: I have reviewed the above documentation for accuracy and completeness, and I agree with the above.  Celine Collard, MD

## 2023-10-29 ENCOUNTER — Other Ambulatory Visit: Payer: Self-pay | Admitting: Family Medicine

## 2023-10-29 NOTE — Telephone Encounter (Signed)
 Copied from CRM [id number]. Topic: Clinical - Medication Refill >> Oct 29, 2023 11:25 AM Phil Braun wrote: zPt is going to be out of town traveling for approx 3 weeks and he has a refill with his mail order pharmacy but it will not be here in time. Could you please refill for 1 month at his local pharmacy so he can get it before he leaves on Saturday. Please advise.    Medication: atorvastatin  (LIPITOR) 80 MG tablet  Has the patient contacted their pharmacy? No  This is the patient's preferred pharmacy:   [id number] - [place], [place] - [address] [facility] [address] [facility] [address] [place] [address] Phone: [phone] Fax: [phone]  Is this the correct pharmacy for this prescription? Yes If no, delete pharmacy and type the correct one.   Has the prescription been filled recently? Yes  Is the patient out of the medication? Yes  Has the patient been seen for an appointment in the last year OR does the patient have an upcoming appointment? Yes  Can we respond through MyChart? No  Agent: Please be advised that Rx refills may take up to 3 business days. We ask that you follow-up with your pharmacy.

## 2023-10-30 MED ORDER — ATORVASTATIN CALCIUM 80 MG PO TABS: 80.0000 mg | ORAL_TABLET | Freq: Every day | ORAL | 0 refills | Status: DC

## 2023-10-30 NOTE — Telephone Encounter (Signed)
 Requested by patient due to going out of town and home delivery medication will not be in before Saturday . Courtesy refill #30 .  Requested Prescriptions  Pending Prescriptions Disp Refills   atorvastatin  (LIPITOR) 80 MG tablet 30 tablet 0    Sig: Take 1 tablet (80 mg total) by mouth daily.     Cardiovascular:  Antilipid - Statins Failed - 10/30/2023  4:13 PM      Failed - Lipid Panel in normal range within the last 12 months    Cholesterol, Total  Date Value Ref Range Status  07/01/2023 144 100 - 199 mg/dL Final   LDL Chol Calc (NIH)  Date Value Ref Range Status  07/01/2023 40 0 - 99 mg/dL Final   HDL  Date Value Ref Range Status  07/01/2023 89 >39 mg/dL Final   Triglycerides  Date Value Ref Range Status  07/01/2023 75 0 - 149 mg/dL Final         Passed - Patient is not pregnant      Passed - Valid encounter within last 12 months    Recent Outpatient Visits           4 months ago Essential hypertension   [facility] [place], Jerilee Montane, DO       Future Appointments             In 2 months Lincoln Renshaw, Jerilee Montane, DO [place] [facility]   In 11 months [place], Nyra Bellis [facility] [place]   In 11 months Elta Halter, MD [facility]

## 2024-01-06 ENCOUNTER — Encounter: Payer: Self-pay | Admitting: Family Medicine

## 2024-01-06 ENCOUNTER — Ambulatory Visit (INDEPENDENT_AMBULATORY_CARE_PROVIDER_SITE_OTHER): Payer: Medicare Other | Admitting: Family Medicine

## 2024-01-06 VITALS — BP 132/82 | HR 55 | Ht 75.0 in | Wt 269.4 lb

## 2024-01-06 DIAGNOSIS — I10 Essential (primary) hypertension: Secondary | ICD-10-CM | POA: Diagnosis not present

## 2024-01-06 DIAGNOSIS — R197 Diarrhea, unspecified: Secondary | ICD-10-CM | POA: Diagnosis not present

## 2024-01-06 DIAGNOSIS — E785 Hyperlipidemia, unspecified: Secondary | ICD-10-CM | POA: Diagnosis not present

## 2024-01-06 DIAGNOSIS — I4819 Other persistent atrial fibrillation: Secondary | ICD-10-CM | POA: Diagnosis not present

## 2024-01-06 MED ORDER — ATORVASTATIN CALCIUM 80 MG PO TABS: 80.0000 mg | ORAL_TABLET | Freq: Every day | ORAL | 1 refills | Status: AC

## 2024-01-06 MED ORDER — APIXABAN 5 MG PO TABS: 5.0000 mg | ORAL_TABLET | Freq: Two times a day (BID) | ORAL | 1 refills | Status: AC

## 2024-01-06 MED ORDER — TRAZODONE HCL 50 MG PO TABS: 25.0000 mg | ORAL_TABLET | Freq: Every evening | ORAL | 3 refills | Status: AC | PRN

## 2024-01-06 MED ORDER — AMLODIPINE BESYLATE 5 MG PO TABS: 7.5000 mg | ORAL_TABLET | Freq: Every day | ORAL | 1 refills | Status: AC

## 2024-01-06 NOTE — Assessment & Plan Note (Signed)
 Under good control on current regimen. Continue current regimen. Continue to monitor. Call with any concerns. Refills given. Labs drawn today.

## 2024-01-06 NOTE — Progress Notes (Signed)
 BP 132/82   Pulse (!) 55   Ht 6' 3 (1.905 m)   Wt 269 lb 6.4 oz (122.2 kg)   SpO2 100%   BMI 33.67 kg/m    Subjective:    Patient ID: Zachary Rogers, male    DOB: [birth year], 73 y.o.   MRN: [id number]  HPI: Zachary Rogers is a 73 y.o. male  Chief Complaint  Patient presents with   Hypertension   Atrial Fibrillation    Onset about a week.    Diarrhea    Chronic. Over a year    HYPERTENSION / HYPERLIPIDEMIA Satisfied with current treatment? yes Duration of hypertension: chronic BP monitoring frequency: not checking BP medication side effects: no Past BP meds: amlodipine  Duration of hyperlipidemia: chronic Cholesterol medication side effects: no Cholesterol supplements: none Past cholesterol medications: atorvastatin  Medication compliance: excellent compliance Aspirin : no Recent stressors: no Recurrent headaches: no Visual changes: no Palpitations: no Dyspnea: no Chest pain: no Lower extremity edema: no Dizzy/lightheaded: no  ABDOMINAL ISSUES Duration: over a year Nature: diarrhea, no pain Location: none  Severity: moderate  Radiation: no Frequency: 3-4x a day Aggravating factors: eating Constipation: no Diarrhea: yes Episodes of diarrhea/day: 3-4x a day Mucous in the stool: no Heartburn: yes Bloating:no Flatulence: no Nausea: no Vomiting: no Melena or hematochezia: no Rash: no Jaundice: no Fever: no Weight loss: no   Relevant past medical, surgical, family and social history reviewed and updated as indicated. Interim medical history since our last visit reviewed. Allergies and medications reviewed and updated.  Review of Systems  Constitutional: Negative.   Respiratory: Negative.    Cardiovascular: Negative.   Musculoskeletal: Negative.   Neurological: Negative.   Psychiatric/Behavioral: Negative.      Per HPI unless specifically indicated above     Objective:    BP 132/82   Pulse (!) 55   Ht 6' 3 (1.905 m)   Wt 269 lb 6.4  oz (122.2 kg)   SpO2 100%   BMI 33.67 kg/m   Wt Readings from Last 3 Encounters:  01/06/24 269 lb 6.4 oz (122.2 kg)  10/08/23 260 lb (117.9 kg)  10/02/23 265 lb (120.2 kg)    Physical Exam Vitals and nursing note reviewed.  Constitutional:      General: He is not in acute distress.    Appearance: Normal appearance. He is not ill-appearing, toxic-appearing or diaphoretic.  HENT:     Head: Normocephalic and atraumatic.     Right Ear: External ear normal.     Left Ear: External ear normal.     Nose: Nose normal.     Mouth/Throat:     Mouth: Mucous membranes are moist.     Pharynx: Oropharynx is clear.  Eyes:     General: No scleral icterus.       Right eye: No discharge.        Left eye: No discharge.     Extraocular Movements: Extraocular movements intact.     Conjunctiva/sclera: Conjunctivae normal.     Pupils: Pupils are equal, round, and reactive to light.  Cardiovascular:     Rate and Rhythm: Normal rate and regular rhythm.     Pulses: Normal pulses.     Heart sounds: Normal heart sounds. No murmur heard.    No friction rub. No gallop.  Pulmonary:     Effort: Pulmonary effort is normal. No respiratory distress.     Breath sounds: Normal breath sounds. No stridor. No wheezing, rhonchi or rales.  Chest:     Chest wall: No tenderness.  Musculoskeletal:        General: Normal range of motion.     Cervical back: Normal range of motion and neck supple.  Skin:    General: Skin is warm and dry.     Capillary Refill: Capillary refill takes less than 2 seconds.     Coloration: Skin is not jaundiced or pale.     Findings: No bruising, erythema, lesion or rash.  Neurological:     General: No focal deficit present.     Mental Status: He is alert and oriented to person, place, and time. Mental status is at baseline.  Psychiatric:        Mood and Affect: Mood normal.        Behavior: Behavior normal.        Thought Content: Thought content normal.        Judgment: Judgment  normal.     Results for orders placed or performed in visit on 07/13/23  Fecal occult blood, imunochemical   Collection Time: 07/13/23 12:00 AM  Result Value Ref Range   Fecal Occult Bld Negative Negative      Assessment & Plan:   Problem List Items Addressed This Visit       Cardiovascular and Mediastinum   Essential hypertension - Primary   Better on recheck. Continue current regimen. Continue to monitor. Call with any concerns.       Relevant Medications   atorvastatin  (LIPITOR) 80 MG tablet   amLODipine  (NORVASC ) 5 MG tablet   apixaban  (ELIQUIS ) 5 MG TABS tablet   Other Relevant Orders   CBC with Differential/Platelet   Comprehensive metabolic panel with GFR   Persistent atrial fibrillation (HCC)   Back in A fib for a week. Will get him back into cardiology. Call with any concerns.       Relevant Medications   atorvastatin  (LIPITOR) 80 MG tablet   amLODipine  (NORVASC ) 5 MG tablet   apixaban  (ELIQUIS ) 5 MG TABS tablet     Other   Hyperlipidemia LDL goal <70   Under good control on current regimen. Continue current regimen. Continue to monitor. Call with any concerns. Refills given. Labs drawn today.        Relevant Medications   atorvastatin  (LIPITOR) 80 MG tablet   amLODipine  (NORVASC ) 5 MG tablet   apixaban  (ELIQUIS ) 5 MG TABS tablet   Other Relevant Orders   CBC with Differential/Platelet   Lipid Panel w/o Chol/HDL Ratio   Comprehensive metabolic panel with GFR   Diarrhea   Due to see GI in October. Await there input.         Follow up plan: Return in about 6 months (around 07/08/2024).

## 2024-01-06 NOTE — Assessment & Plan Note (Signed)
 Back in A fib for a week. Will get him back into cardiology. Call with any concerns.

## 2024-01-06 NOTE — Assessment & Plan Note (Signed)
 Due to see GI in October. Await there input.

## 2024-01-06 NOTE — Assessment & Plan Note (Signed)
Better on recheck. Continue current regimen. Continue to monitor. Call with any concerns.  

## 2024-01-07 ENCOUNTER — Encounter: Payer: Self-pay | Admitting: Cardiology

## 2024-01-07 ENCOUNTER — Ambulatory Visit: Attending: Cardiology | Admitting: Cardiology

## 2024-01-07 VITALS — BP 118/78 | HR 127 | Ht 75.0 in | Wt 267.2 lb

## 2024-01-07 DIAGNOSIS — I10 Essential (primary) hypertension: Secondary | ICD-10-CM | POA: Diagnosis not present

## 2024-01-07 DIAGNOSIS — E785 Hyperlipidemia, unspecified: Secondary | ICD-10-CM | POA: Diagnosis not present

## 2024-01-07 DIAGNOSIS — I4819 Other persistent atrial fibrillation: Secondary | ICD-10-CM | POA: Insufficient documentation

## 2024-01-07 DIAGNOSIS — Q2112 Patent foramen ovale: Secondary | ICD-10-CM | POA: Diagnosis not present

## 2024-01-07 LAB — COMPREHENSIVE METABOLIC PANEL WITH GFR
ALT: 53 IU/L — ABNORMAL HIGH (ref 0–44)
AST: 33 IU/L (ref 0–40)
Albumin: 4.2 g/dL (ref 3.8–4.8)
Alkaline Phosphatase: 106 IU/L (ref 44–121)
BUN/Creatinine Ratio: 13 (ref 10–24)
BUN: 17 mg/dL (ref 8–27)
Bilirubin Total: 1.9 mg/dL — ABNORMAL HIGH (ref 0.0–1.2)
CO2: 21 mmol/L (ref 20–29)
Calcium: 8.9 mg/dL (ref 8.6–10.2)
Chloride: 107 mmol/L — ABNORMAL HIGH (ref 96–106)
Creatinine, Ser: 1.34 mg/dL — ABNORMAL HIGH (ref 0.76–1.27)
Globulin, Total: 2.3 g/dL (ref 1.5–4.5)
Glucose: 96 mg/dL (ref 70–99)
Potassium: 3.5 mmol/L (ref 3.5–5.2)
Sodium: 144 mmol/L (ref 134–144)
Total Protein: 6.5 g/dL (ref 6.0–8.5)
eGFR: 55 mL/min/1.73 — ABNORMAL LOW (ref >59)

## 2024-01-07 LAB — CBC WITH DIFFERENTIAL/PLATELET
Basophils Absolute: 0.1 x10E3/uL (ref 0.0–0.2)
Basos: 1 %
EOS (ABSOLUTE): 0.3 x10E3/uL (ref 0.0–0.4)
Eos: 5 %
Hematocrit: 45.6 % (ref 37.5–51.0)
Hemoglobin: 14.7 g/dL (ref 13.0–17.7)
Immature Grans (Abs): 0 x10E3/uL (ref 0.0–0.1)
Immature Granulocytes: 0 %
Lymphocytes Absolute: 1.5 x10E3/uL (ref 0.7–3.1)
Lymphs: 22 %
MCH: 30.6 pg (ref 26.6–33.0)
MCHC: 32.2 g/dL (ref 31.5–35.7)
MCV: 95 fL (ref 79–97)
Monocytes Absolute: 0.6 x10E3/uL (ref 0.1–0.9)
Monocytes: 9 %
Neutrophils Absolute: 4.1 x10E3/uL (ref 1.4–7.0)
Neutrophils: 63 %
Platelets: 176 x10E3/uL (ref 150–450)
RBC: 4.8 x10E6/uL (ref 4.14–5.80)
RDW: 14.1 % (ref 11.6–15.4)
WBC: 6.5 x10E3/uL (ref 3.4–10.8)

## 2024-01-07 LAB — LIPID PANEL W/O CHOL/HDL RATIO
Cholesterol, Total: 128 mg/dL (ref 100–199)
HDL: 79 mg/dL (ref >39)
LDL Chol Calc (NIH): 36 mg/dL (ref 0–99)
Triglycerides: 58 mg/dL (ref 0–149)
VLDL Cholesterol Cal: 13 mg/dL (ref 5–40)

## 2024-01-07 MED ORDER — METOPROLOL TARTRATE 25 MG PO TABS
25.0000 mg | ORAL_TABLET | Freq: Two times a day (BID) | ORAL | 3 refills | Status: DC
Start: 2024-01-07 — End: 2024-01-07

## 2024-01-07 MED ORDER — METOPROLOL TARTRATE 25 MG PO TABS: 25.0000 mg | ORAL_TABLET | Freq: Two times a day (BID) | ORAL | 3 refills | Status: DC

## 2024-01-07 NOTE — H&P (View-Only) (Signed)
 Cardiology Office Note   Date:  01/07/2024  ID:  Zachary Rogers, Zachary Rogers 1950/08/28, MRN [id number] PCP: Vicci Duwaine SQUIBB, DO  [facility] Providers Cardiologist:  Lonni Hanson, MD     History of Present Illness Zachary Rogers is a 73 y.o. male with past medical history of stroke with subsequent discovery of large PFO status post PFO closure, A-fib status post DCCV, hypertension, hyperlipidemia, nephrolithiasis, who is here today for follow-up for questionable paroxysmal atrial fibrillation.   Seen 12/12/2021 was in atrial fibrillation RVR heart rate 113.  Labs ordered Lopressor  was restarted.  In follow-up in 12/2021 was still in A-fib RVR was sent for repeat cardioversion.  Previously was seen in November 2023 at that time he was still in atrial fibrillation and underwent cardioversion in December.  In January he was back in normal sinus rhythm and was suspected atrial fibrillation was related to PFO closure.  Apixaban  was continued.  Metoprolol  had been previously discontinued.  He was seen in clinic 01/29/2022 with sinus bradycardia with heart rate of 40 bpm and his current metoprolol  dosing was 25 mg twice daily.  He was asymptomatic.  He had not been continued on apixaban  with no issues with bleeding.  Due to his recurrent bradycardia his Lopressor  was decreased to 12.5 mg twice daily.   He was last seen in clinic 05/07/2023 by Dr. Hanson.  At that time he was feeling fairly well denying any chest pain, shortness of breath, palpitations and lightheadedness.  He noted rare vertigo which have been longstanding and unchanged.  He also reported sporadic dependent ankle edema but no persistent leg swelling.  He notes that he recently spent several weeks in [place] caring for a friend with whom he was on a cruise.  His friend was hospitalized with pneumonia for several weeks.  He remains compliant with all of his medications including apixaban  without any further issues.  There were no changes  made to his medication regimen or further testing that was needed.  He returns to clinic today accompanied by his wife.  He states he feels distended, given atrial fibrillation approximately 4 to 5 days ago when he noticed a shortness of breath.  Has longstanding history of atrial fibrillation with his last cardioversion in 8 of 2023.  States that they have traveled a lot over the past 3 months with a trip to [place] and a trip to [place] and then visiting grandkids.  He feels that he did overdo himself.  He does continue to have peripheral edema and shortness of breath that accompanies the atrial fibrillation.  Denies any chest pain.  Unfortunately he states he did miss a dose of apixaban  approximately a week ago.  States that he has been compliant with his current medication regimen without any undue side effects.  Denies any bleeding with no blood noted in his stool or urine.  He denies any hospitalizations or visits to the emergency department.  ROS: 10 point review of systems has been reviewed and considered negative except ones were listed in the HPI  Studies Reviewed EKG Interpretation Date/Time:  Thursday January 07 2024 15:24:59 EDT Ventricular Rate:  120 PR Interval:    QRS Duration:  106 QT Interval:  360 QTC Calculation: 508 R Axis:   -43  Text Interpretation: Atrial fibrillation with rapid ventricular response Left axis deviation When compared with ECG of 07-Jan-2024 15:11, No significant change was found Confirmed by Gerard Frederick ([id number]) on 01/07/2024 3:29:50 PM  Echo 02/13/21   1. Aneurysmal with PFO on prior TEE. Post closure with 18 mm Aplatzer  device On 4 chamber and PSSA images no residual PFO Subcostal images poor.   2. Right atrial size was mildly dilated.   3. The mitral valve is abnormal.   4. The aortic valve is tricuspid. Aortic valve regurgitation is mild.  Mild to moderate aortic valve sclerosis/calcification is present, without  any evidence of aortic stenosis.     Cardiac Cath 01/2021 Successful transcatheter PFO closure under intracardiac echo and fluoroscopic guidance using an 18 mm Amplatzer atrial septal occluder device.  Recommendations: Limited 2D echocardiogram prior to discharge Dual antiplatelet therapy for 6 months with aspirin  and clopidogrel  SBE prophylaxis x6 months when indicated Same-day discharge if criteria met   Echo TEE 12/2020  1. Left ventricular ejection fraction, by estimation, is 60 to 65%. The  left ventricle has normal function.   2. Right ventricular systolic function is normal. The right ventricular  size is normal.   3. No left atrial/left atrial appendage thrombus was detected.   4. The interatrial septum is aneurysmal with bowing both into L and R  atrium There is a large PFO that is easily seen with color doppler with L  to R flow. Alos shown when tested with injection of agitated saline with  appearance of bubbles seen in left  sided chambers .   5. The mitral valve is normal in structure. Mild mitral valve  regurgitation.   6. Aortic valve regurgitation is trivial.   7. Minimal fixed plaquing in the thoracic aorta. Risk Assessment/Calculations  CHA2DS2-VASc Score = 4   This indicates a 4.8% annual risk of stroke. The patient's score is based upon: CHF History: 0 HTN History: 1 Diabetes History: 0 Stroke History: 2 Vascular Disease History: 0 Age Score: 1 Gender Score: 0            Physical Exam VS:  BP 118/78 (BP Location: Left Arm, Patient Position: Sitting, Cuff Size: Normal)   Pulse (!) 127   Ht 6' 3 (1.905 m)   Wt 267 lb 3.2 oz (121.2 kg)   SpO2 97%   BMI 33.40 kg/m        Wt Readings from Last 3 Encounters:  01/07/24 267 lb 3.2 oz (121.2 kg)  01/06/24 269 lb 6.4 oz (122.2 kg)  10/08/23 260 lb (117.9 kg)    GEN: Well nourished, well developed in no acute distress NECK: No JVD; No carotid bruits CARDIAC:IR IR, no murmurs, rubs, gallops RESPIRATORY:  Clear to auscultation without  rales, wheezing or rhonchi  ABDOMEN: Soft, non-tender, non-distended EXTREMITIES:  No edema; No deformity   ASSESSMENT AND PLAN Persistent atrial fibrillation with previous history of cardioversion and recurrence of atrial fibrillation.  His last cardioversion was in 2023.  He thinks that with the increased fatigue from traveling and alcohol use it is triggered his atrial fibrillation.  EKG today reveals atrial fibrillation with a rate of 129 left axis deviation.  He has been restarted on metoprolol  tartrate 25 mg twice daily which will likely need to be discontinued post cardioversion due to baseline bradycardia.  Thyroid  recently checked was normal and labs have been normal.  Unfortunately he did miss a dose of apixaban  so he has been advised as well as his wife that he cannot having any missed doses of his blood thinner for the next 3 weeks prior to his procedure.  We did also discuss the restarting of the beta-blocker therapy may  slow him down enough where he can comfort his self but they will check prior to the procedure.  He has been continued on apixaban  5 mg twice daily for CHA2DS2-VASc score of at least 4 for stroke prophylaxis.  PFO status post occlusion continued on anticoagulation now in the setting of atrial fibrillation.   Hypertension with blood pressure today of 118/78.  Blood pressure has been well-controlled.  He is continued on amlodipine  7.5 mg daily.  Encouraged to monitor pressure 1 to 2 hours postmedication administration as well.  Hyperlipidemia where he has been continued on atorvastatin  80 mg daily with ongoing management per his PCP.    Informed Consent   Shared Decision Making/Informed Consent The risks (stroke, cardiac arrhythmias rarely resulting in the need for a temporary or permanent pacemaker, skin irritation or burns and complications associated with conscious sedation including aspiration, arrhythmia, respiratory failure and death), benefits (restoration of normal  sinus rhythm) and alternatives of a direct current cardioversion were explained in detail to Zachary Rogers and he agrees to proceed.       Dispo: Patient to return to clinic to see MD/APP 2 to 3 weeks postprocedure or sooner if needed for further evaluation.  Signed, Zophia Marrone, NP

## 2024-01-07 NOTE — Progress Notes (Signed)
 Cardiology Office Note   Date:  01/07/2024  ID:  Lejon, Afzal 1950/08/28, MRN [id number] PCP: Vicci Duwaine SQUIBB, DO  [facility] Providers Cardiologist:  Lonni Hanson, MD     History of Present Illness Zachary Rogers is a 73 y.o. male with past medical history of stroke with subsequent discovery of large PFO status post PFO closure, A-fib status post DCCV, hypertension, hyperlipidemia, nephrolithiasis, who is here today for follow-up for questionable paroxysmal atrial fibrillation.   Seen 12/12/2021 was in atrial fibrillation RVR heart rate 113.  Labs ordered Lopressor  was restarted.  In follow-up in 12/2021 was still in A-fib RVR was sent for repeat cardioversion.  Previously was seen in November 2023 at that time he was still in atrial fibrillation and underwent cardioversion in December.  In January he was back in normal sinus rhythm and was suspected atrial fibrillation was related to PFO closure.  Apixaban  was continued.  Metoprolol  had been previously discontinued.  He was seen in clinic 01/29/2022 with sinus bradycardia with heart rate of 40 bpm and his current metoprolol  dosing was 25 mg twice daily.  He was asymptomatic.  He had not been continued on apixaban  with no issues with bleeding.  Due to his recurrent bradycardia his Lopressor  was decreased to 12.5 mg twice daily.   He was last seen in clinic 05/07/2023 by Dr. Hanson.  At that time he was feeling fairly well denying any chest pain, shortness of breath, palpitations and lightheadedness.  He noted rare vertigo which have been longstanding and unchanged.  He also reported sporadic dependent ankle edema but no persistent leg swelling.  He notes that he recently spent several weeks in [place] caring for a friend with whom he was on a cruise.  His friend was hospitalized with pneumonia for several weeks.  He remains compliant with all of his medications including apixaban  without any further issues.  There were no changes  made to his medication regimen or further testing that was needed.  He returns to clinic today accompanied by his wife.  He states he feels distended, given atrial fibrillation approximately 4 to 5 days ago when he noticed a shortness of breath.  Has longstanding history of atrial fibrillation with his last cardioversion in 8 of 2023.  States that they have traveled a lot over the past 3 months with a trip to [place] and a trip to [place] and then visiting grandkids.  He feels that he did overdo himself.  He does continue to have peripheral edema and shortness of breath that accompanies the atrial fibrillation.  Denies any chest pain.  Unfortunately he states he did miss a dose of apixaban  approximately a week ago.  States that he has been compliant with his current medication regimen without any undue side effects.  Denies any bleeding with no blood noted in his stool or urine.  He denies any hospitalizations or visits to the emergency department.  ROS: 10 point review of systems has been reviewed and considered negative except ones were listed in the HPI  Studies Reviewed EKG Interpretation Date/Time:  Thursday January 07 2024 15:24:59 EDT Ventricular Rate:  120 PR Interval:    QRS Duration:  106 QT Interval:  360 QTC Calculation: 508 R Axis:   -43  Text Interpretation: Atrial fibrillation with rapid ventricular response Left axis deviation When compared with ECG of 07-Jan-2024 15:11, No significant change was found Confirmed by Gerard Frederick ([id number]) on 01/07/2024 3:29:50 PM  Echo 02/13/21   1. Aneurysmal with PFO on prior TEE. Post closure with 18 mm Aplatzer  device On 4 chamber and PSSA images no residual PFO Subcostal images poor.   2. Right atrial size was mildly dilated.   3. The mitral valve is abnormal.   4. The aortic valve is tricuspid. Aortic valve regurgitation is mild.  Mild to moderate aortic valve sclerosis/calcification is present, without  any evidence of aortic stenosis.     Cardiac Cath 01/2021 Successful transcatheter PFO closure under intracardiac echo and fluoroscopic guidance using an 18 mm Amplatzer atrial septal occluder device.  Recommendations: Limited 2D echocardiogram prior to discharge Dual antiplatelet therapy for 6 months with aspirin  and clopidogrel  SBE prophylaxis x6 months when indicated Same-day discharge if criteria met   Echo TEE 12/2020  1. Left ventricular ejection fraction, by estimation, is 60 to 65%. The  left ventricle has normal function.   2. Right ventricular systolic function is normal. The right ventricular  size is normal.   3. No left atrial/left atrial appendage thrombus was detected.   4. The interatrial septum is aneurysmal with bowing both into L and R  atrium There is a large PFO that is easily seen with color doppler with L  to R flow. Alos shown when tested with injection of agitated saline with  appearance of bubbles seen in left  sided chambers .   5. The mitral valve is normal in structure. Mild mitral valve  regurgitation.   6. Aortic valve regurgitation is trivial.   7. Minimal fixed plaquing in the thoracic aorta. Risk Assessment/Calculations  CHA2DS2-VASc Score = 4   This indicates a 4.8% annual risk of stroke. The patient's score is based upon: CHF History: 0 HTN History: 1 Diabetes History: 0 Stroke History: 2 Vascular Disease History: 0 Age Score: 1 Gender Score: 0            Physical Exam VS:  BP 118/78 (BP Location: Left Arm, Patient Position: Sitting, Cuff Size: Normal)   Pulse (!) 127   Ht 6' 3 (1.905 m)   Wt 267 lb 3.2 oz (121.2 kg)   SpO2 97%   BMI 33.40 kg/m        Wt Readings from Last 3 Encounters:  01/07/24 267 lb 3.2 oz (121.2 kg)  01/06/24 269 lb 6.4 oz (122.2 kg)  10/08/23 260 lb (117.9 kg)    GEN: Well nourished, well developed in no acute distress NECK: No JVD; No carotid bruits CARDIAC:IR IR, no murmurs, rubs, gallops RESPIRATORY:  Clear to auscultation without  rales, wheezing or rhonchi  ABDOMEN: Soft, non-tender, non-distended EXTREMITIES:  No edema; No deformity   ASSESSMENT AND PLAN Persistent atrial fibrillation with previous history of cardioversion and recurrence of atrial fibrillation.  His last cardioversion was in 2023.  He thinks that with the increased fatigue from traveling and alcohol use it is triggered his atrial fibrillation.  EKG today reveals atrial fibrillation with a rate of 129 left axis deviation.  He has been restarted on metoprolol  tartrate 25 mg twice daily which will likely need to be discontinued post cardioversion due to baseline bradycardia.  Thyroid  recently checked was normal and labs have been normal.  Unfortunately he did miss a dose of apixaban  so he has been advised as well as his wife that he cannot having any missed doses of his blood thinner for the next 3 weeks prior to his procedure.  We did also discuss the restarting of the beta-blocker therapy may  slow him down enough where he can comfort his self but they will check prior to the procedure.  He has been continued on apixaban  5 mg twice daily for CHA2DS2-VASc score of at least 4 for stroke prophylaxis.  PFO status post occlusion continued on anticoagulation now in the setting of atrial fibrillation.   Hypertension with blood pressure today of 118/78.  Blood pressure has been well-controlled.  He is continued on amlodipine  7.5 mg daily.  Encouraged to monitor pressure 1 to 2 hours postmedication administration as well.  Hyperlipidemia where he has been continued on atorvastatin  80 mg daily with ongoing management per his PCP.    Informed Consent   Shared Decision Making/Informed Consent The risks (stroke, cardiac arrhythmias rarely resulting in the need for a temporary or permanent pacemaker, skin irritation or burns and complications associated with conscious sedation including aspiration, arrhythmia, respiratory failure and death), benefits (restoration of normal  sinus rhythm) and alternatives of a direct current cardioversion were explained in detail to Mr. Con Arganbright and he agrees to proceed.       Dispo: Patient to return to clinic to see MD/APP 2 to 3 weeks postprocedure or sooner if needed for further evaluation.  Signed, Zophia Marrone, NP

## 2024-01-07 NOTE — Patient Instructions (Signed)
 Medication Instructions:  Your physician recommends the following medication changes.  START TAKING: Metoprolol  25 mg two times daily  *If you need a refill on your cardiac medications before your next appointment, please call your pharmacy*  Lab Work: Your provider would like for you to return in 01/25/24 to have the following labs drawn: CBC, BMP.   Please go to [facility] [address] ([facility], [place] [address] You do not need an appointment.  They are open from 8 am- 4:30 pm.  Lunch from 1:00 pm- 2:00 pm You DO NOT need to be fasting.   You may also go to one of the following [facility]:  [address] [place], [place] [address] Phone: [phone] Lab hours: Mon-Fri 8 am- 5 pm    Lunch 12 pm- 1 pm  [address] [place],  [place]  [address]  [place]  Phone: [phone] Lab hours: 7 am- 4 pm Lunch 12 pm-1 pm   [address] [place],  [place]  [address]  [place]  Phone: [phone] Lab hours: Mon-Fri 8 am- 5 pm    Lunch 12 pm- 1 pm  If you have labs (blood work) drawn today and your tests are completely normal, you will receive your results only by: MyChart Message (if you have MyChart) OR A paper copy in the mail If you have any lab test that is abnormal or we need to change your treatment, we will call you to review the results.  Testing/Procedures:   Dear Helayne LITTIE Arley Mickey  You are scheduled for a Cardioversion on Thursday, September 11 with Dr. Gollan.  Please arrive at the [facility] Entrance of [facility], [address], [place] [address] at 6:30 AM (This is 7:30 hour(s) prior to your procedure time).  Proceed to the Check-In Desk directly inside the entrance.  Procedure Parking: Use the entrance off of the [address] side of the hospital. Turn right upon entering and follow the driveway to parking that is directly in front of the [facility]. There is no valet parking available at this entrance, however there  is an awning directly in front of the [facility] for drop off/ pick up for patients.   DIET:  Nothing to eat or drink after midnight except a sip of water with medications (see medication instructions below)  MEDICATION INSTRUCTIONS: !!IF ANY NEW MEDICATIONS ARE STARTED AFTER TODAY, PLEASE NOTIFY YOUR PROVIDER AS SOON AS POSSIBLE!!   Continue taking your anticoagulant (blood thinner): Apixaban  (Eliquis ).  You will need to continue this after your procedure until you are told by your provider that it is safe to stop.    LABS:   [facility]. You do not need an appointment. Check in at front desk and let them know you are here for labs only.  FYI:  For your safety, and to allow us  to monitor your vital signs accurately during the surgery/procedure we request: If you have artificial nails, gel coating, SNS etc, please have those removed prior to your surgery/procedure. Not having the nail coverings /polish removed may result in cancellation or delay of your surgery/procedure.  Your support person will be asked to wait in the waiting room during your procedure.  It is OK to have someone drop you off and come back when you are ready to be discharged.  You cannot drive after the procedure and will need someone to drive you home.  Bring your insurance cards.  *  Special Note: Every effort is made to have your procedure done on time. Occasionally there are emergencies that occur at the hospital that may cause delays. Please be patient if a delay does occur.    Follow-Up: At [facility], you and your health needs are our priority.  As part of our continuing mission to provide you with exceptional heart care, our providers are all part of one team.  This team includes your primary Cardiologist (physician) and Advanced Practice Providers or APPs (Physician Assistants and Nurse Practitioners) who all work together to provide you with the care you need, when you need  it.  Your next appointment:   2 - 3 week(s) post procedure  Provider:   You may see Lonni Hanson, MD or one of the following Advanced Practice Providers on your designated Care Team:   Lonni Meager, NP Lesley Maffucci, PA-C Bernardino Bring, PA-C Cadence Candor, PA-C Tylene Lunch, NP Barnie Hila, NP

## 2024-01-08 ENCOUNTER — Ambulatory Visit: Payer: Self-pay | Admitting: Family Medicine

## 2024-01-25 ENCOUNTER — Ambulatory Visit: Payer: Self-pay | Admitting: Cardiology

## 2024-01-25 ENCOUNTER — Other Ambulatory Visit
Admission: RE | Admit: 2024-01-25 | Discharge: 2024-01-25 | Disposition: A | Discharge status: Home / Self Care | Source: Ambulatory Visit | Attending: Cardiology | Admitting: Cardiology

## 2024-01-25 DIAGNOSIS — I4819 Other persistent atrial fibrillation: Secondary | ICD-10-CM | POA: Insufficient documentation

## 2024-01-25 LAB — BASIC METABOLIC PANEL WITH GFR
Anion gap: 12 (ref 5–15)
BUN: 18 mg/dL (ref 8–23)
CO2: 23 mmol/L (ref 22–32)
Calcium: 9.4 mg/dL (ref 8.9–10.3)
Chloride: 106 mmol/L (ref 98–111)
Creatinine, Ser: 1.2 mg/dL (ref 0.61–1.24)
GFR, Estimated: >60 mL/min (ref >60)
Glucose, Bld: 118 mg/dL — ABNORMAL HIGH (ref 70–99)
Potassium: 3.5 mmol/L (ref 3.5–5.1)
Sodium: 141 mmol/L (ref 135–145)

## 2024-01-25 LAB — CBC
HCT: 43.9 % (ref 39.0–52.0)
Hemoglobin: 15.3 g/dL (ref 13.0–17.0)
MCH: 31.2 pg (ref 26.0–34.0)
MCHC: 34.9 g/dL (ref 30.0–36.0)
MCV: 89.4 fL (ref 80.0–100.0)
Platelets: 180 K/uL (ref 150–400)
RBC: 4.91 MIL/uL (ref 4.22–5.81)
RDW: 13.6 % (ref 11.5–15.5)
WBC: 8.5 K/uL (ref 4.0–10.5)
nRBC: 0 % (ref 0.0–0.2)

## 2024-01-25 NOTE — Progress Notes (Signed)
 Labs have remained stable.  No changes needed in current medication regimen at this time.

## 2024-01-28 ENCOUNTER — Encounter: Payer: Self-pay | Admitting: Cardiovascular Disease

## 2024-01-28 ENCOUNTER — Encounter
Admission: RE | Disposition: A | Discharge status: Home / Self Care | Payer: Self-pay | Source: Home / Self Care | Attending: Cardiovascular Disease

## 2024-01-28 ENCOUNTER — Ambulatory Visit
Admission: RE | Admit: 2024-01-28 | Discharge: 2024-01-28 | Disposition: A | Discharge status: Home / Self Care | Attending: Cardiovascular Disease | Admitting: Cardiovascular Disease

## 2024-01-28 ENCOUNTER — Ambulatory Visit: Payer: Self-pay | Admitting: Anesthesiology

## 2024-01-28 DIAGNOSIS — Z6833 Body mass index (BMI) 33.0-33.9, adult: Secondary | ICD-10-CM | POA: Diagnosis not present

## 2024-01-28 DIAGNOSIS — I2510 Atherosclerotic heart disease of native coronary artery without angina pectoris: Secondary | ICD-10-CM | POA: Insufficient documentation

## 2024-01-28 DIAGNOSIS — E669 Obesity, unspecified: Secondary | ICD-10-CM | POA: Insufficient documentation

## 2024-01-28 DIAGNOSIS — I10 Essential (primary) hypertension: Secondary | ICD-10-CM | POA: Insufficient documentation

## 2024-01-28 DIAGNOSIS — Z7901 Long term (current) use of anticoagulants: Secondary | ICD-10-CM | POA: Diagnosis not present

## 2024-01-28 DIAGNOSIS — Z87891 Personal history of nicotine dependence: Secondary | ICD-10-CM | POA: Diagnosis not present

## 2024-01-28 DIAGNOSIS — I351 Nonrheumatic aortic (valve) insufficiency: Secondary | ICD-10-CM | POA: Insufficient documentation

## 2024-01-28 DIAGNOSIS — F17200 Nicotine dependence, unspecified, uncomplicated: Secondary | ICD-10-CM | POA: Diagnosis not present

## 2024-01-28 DIAGNOSIS — I4891 Unspecified atrial fibrillation: Secondary | ICD-10-CM | POA: Diagnosis not present

## 2024-01-28 DIAGNOSIS — I452 Bifascicular block: Secondary | ICD-10-CM | POA: Diagnosis not present

## 2024-01-28 DIAGNOSIS — I4819 Other persistent atrial fibrillation: Secondary | ICD-10-CM | POA: Diagnosis not present

## 2024-01-28 DIAGNOSIS — E782 Mixed hyperlipidemia: Secondary | ICD-10-CM | POA: Insufficient documentation

## 2024-01-28 DIAGNOSIS — R0602 Shortness of breath: Secondary | ICD-10-CM

## 2024-01-28 HISTORY — PX: CARDIOVERSION: SHX1299

## 2024-01-28 SURGERY — CARDIOVERSION
Anesthesia: General

## 2024-01-28 MED ORDER — ONDANSETRON HCL 4 MG/2ML IJ SOLN: 4.0000 mg | Freq: Once | INTRAMUSCULAR | Status: DC | PRN

## 2024-01-28 MED ORDER — PROPOFOL 10 MG/ML IV BOLUS
INTRAVENOUS | Status: AC
  Filled 2024-01-28: qty 40

## 2024-01-28 MED ORDER — PROPOFOL 10 MG/ML IV BOLUS
INTRAVENOUS | Status: DC | PRN
  Administered 2024-01-28: 50 mg via INTRAVENOUS
  Administered 2024-01-28: 30 mg via INTRAVENOUS

## 2024-01-28 MED ORDER — SODIUM CHLORIDE 0.9 % IV SOLN: INTRAVENOUS | Status: DC

## 2024-01-28 NOTE — Transfer of Care (Signed)
 Immediate Anesthesia Transfer of Care Note  Patient: Zachary Rogers  Procedure(s) Performed: CARDIOVERSION  Patient Location: [facility]  Anesthesia Type:General  Level of Consciousness: drowsy and patient cooperative  Airway & Oxygen Therapy: Patient Spontanous Breathing  Post-op Assessment: Report given to RN and Post -op Vital signs reviewed and stable  Post vital signs: Reviewed and stable  Last Vitals:  Vitals Value Taken Time  BP 104/87   Temp    Pulse 45   Resp 14   O2 Sat 96%     Last Pain:  Vitals:   01/28/24 0657  TempSrc: Oral  PainSc: 0-No pain         Complications: No notable events documented.

## 2024-01-28 NOTE — Progress Notes (Signed)
 Cardioversion procedure note For atrial fibrillation, persistent.  Procedure Details:  Consent: Risks of procedure as well as the alternatives and risks of each were explained to the (patient/caregiver).  Consent for procedure obtained.  Time Out: Verified patient identification, verified procedure, site/side was marked, verified correct patient position, special equipment/implants available, medications/allergies/relevent history reviewed, required imaging and test results available.  Performed  Patient placed on cardiac monitor, pulse oximetry, supplemental oxygen as necessary.   Sedation given: propofol IV, Dr. Ronni Rumble Pacer pads placed anterior and posterior chest.   Cardioverted 1 time(s).   Cardioverted at  150 J. Synchronized biphasic Converted to NSR   Evaluation: Findings: Post procedure EKG shows: NSR Complications: None Patient did tolerate procedure well.  Time Spent Directly with the Patient:  45 minutes   Dossie Arbour, M.D., Ph.D.

## 2024-01-28 NOTE — Anesthesia Preprocedure Evaluation (Addendum)
 Anesthesia Evaluation  Patient identified by MRN, date of birth, ID band Patient awake    Reviewed: Allergy & Precautions, NPO status , Patient's Chart, lab work & pertinent test results  History of Anesthesia Complications Negative for: history of anesthetic complications  Airway Mallampati: IV   Neck ROM: Full    Dental  (+) Missing   Pulmonary former smoker (quit greater than 20 years ago; was an occasional smoker)   Pulmonary exam normal breath sounds clear to auscultation       Cardiovascular hypertension, + CAD  + dysrhythmias (a fib on Eliquis ) + Valvular Problems/Murmurs (PFO s/p closure)  Rhythm:Irregular Rate:Normal + Systolic murmurs Myocardial perfusion 05/15/22:    The study is normal. The study is low risk.   Exercise capacity was normal. Patient exercised for 5 min and 28 sec. Maximum HR of 144 bpm. MPHR 97.0 %. Peak METS 7.0 .  Hypertensive response to exercise   No ST deviation was noted.   LV perfusion is normal. There is no evidence of ischemia. There is no evidence of infarction.  There was an inferior wall defect that was only seen on rest images  but not stress images.  This is likely an artifact.   Left ventricular function is normal. End diastolic cavity size is mildly enlarged. End systolic cavity size is normal.   CT attenuation images showed mild coronary calcifications.  Echo 02/13/21:  1. Aneurysmal with PFO on prior TEE. Post closure with 18 mm Aplatzer device On 4 chamber and PSSA images no residual PFO Subcostal images poor.   2. Right atrial size was mildly dilated.   3. The mitral valve is abnormal.   4. The aortic valve is tricuspid. Aortic valve regurgitation is mild. Mild to moderate aortic valve sclerosis/calcification is present, without any evidence of aortic stenosis.     Neuro/Psych CVA (2022), No Residual Symptoms    GI/Hepatic negative GI ROS,,,  Endo/Other  Obesity    Renal/GU Renal disease (nephrolithiasis)   BPH    Musculoskeletal   Abdominal   Peds  Hematology negative hematology ROS (+)   Anesthesia Other Findings   Reproductive/Obstetrics                              Anesthesia Physical Anesthesia Plan  ASA: 3  Anesthesia Plan: General   Post-op Pain Management:    Induction: Intravenous  PONV Risk Score and Plan: 2 and Propofol  infusion, TIVA and Treatment may vary due to age or medical condition  Airway Management Planned: Natural Airway  Additional Equipment:   Intra-op Plan:   Post-operative Plan:   Informed Consent: I have reviewed the patients History and Physical, chart, labs and discussed the procedure including the risks, benefits and alternatives for the proposed anesthesia with the patient or authorized representative who has indicated his/her understanding and acceptance.       Plan Discussed with: CRNA  Anesthesia Plan Comments: (LMA/GETA backup discussed.  Patient consented for risks of anesthesia including but not limited to:  - adverse reactions to medications - damage to eyes, teeth, lips or other oral mucosa - nerve damage due to positioning  - sore throat or hoarseness - damage to heart, brain, nerves, lungs, other parts of body or loss of life  Informed patient about role of CRNA in peri- and intra-operative care.  Patient voiced understanding.)         Anesthesia Quick Evaluation

## 2024-01-29 ENCOUNTER — Encounter: Payer: Self-pay | Admitting: Cardiovascular Disease

## 2024-01-29 NOTE — Anesthesia Postprocedure Evaluation (Signed)
 Anesthesia Post Note  Patient: Zachary Rogers  Procedure(s) Performed: CARDIOVERSION  Patient location during evaluation: PACU Anesthesia Type: General Level of consciousness: awake and alert, oriented and patient cooperative Pain management: pain level controlled Vital Signs Assessment: post-procedure vital signs reviewed and stable Respiratory status: spontaneous breathing, nonlabored ventilation and respiratory function stable Cardiovascular status: blood pressure returned to baseline and stable Postop Assessment: adequate PO intake Anesthetic complications: no   No notable events documented.   Last Vitals:  Vitals:   01/28/24 0815 01/28/24 0825  BP: (!) 132/95 130/75  Pulse: (!) 45   Resp: 12 16  Temp: 36.4 C   SpO2: 97% 96%    Last Pain:  Vitals:   01/28/24 0825  TempSrc:   PainSc: 0-No pain                 Alfonso Ruths

## 2024-01-31 NOTE — Interval H&P Note (Signed)
 History and Physical Interval Note:  01/31/2024 11:14 AM  Zachary Rogers Arley Mickey  has presented today for surgery, with the diagnosis of Cardioversion   Afib.  The various methods of treatment have been discussed with the patient and family. After consideration of risks, benefits and other options for treatment, the patient has consented to  Procedure(s): CARDIOVERSION (N/A) as a surgical intervention.  The patient's history has been reviewed, patient examined, no change in status, stable for surgery.  I have reviewed the patient's chart and labs.  Questions were answered to the patient's satisfaction.     [name]

## 2024-01-31 NOTE — H&P (Signed)

## 2024-02-15 ENCOUNTER — Ambulatory Visit: Attending: Cardiology | Admitting: Cardiology

## 2024-02-15 ENCOUNTER — Encounter: Payer: Self-pay | Admitting: Cardiology

## 2024-02-15 VITALS — BP 130/70 | HR 43 | Ht 75.0 in | Wt 265.8 lb

## 2024-02-15 DIAGNOSIS — Q2112 Patent foramen ovale: Secondary | ICD-10-CM | POA: Diagnosis not present

## 2024-02-15 DIAGNOSIS — I4819 Other persistent atrial fibrillation: Secondary | ICD-10-CM | POA: Diagnosis not present

## 2024-02-15 DIAGNOSIS — E785 Hyperlipidemia, unspecified: Secondary | ICD-10-CM | POA: Insufficient documentation

## 2024-02-15 DIAGNOSIS — I10 Essential (primary) hypertension: Secondary | ICD-10-CM | POA: Insufficient documentation

## 2024-02-15 DIAGNOSIS — Z8673 Personal history of transient ischemic attack (TIA), and cerebral infarction without residual deficits: Secondary | ICD-10-CM | POA: Diagnosis not present

## 2024-02-15 MED ORDER — METOPROLOL TARTRATE 25 MG PO TABS: 25.0000 mg | ORAL_TABLET | Freq: Two times a day (BID) | ORAL | Status: DC

## 2024-02-15 NOTE — Patient Instructions (Signed)
 Medication Instructions:  Your physician recommends the following medication changes.  DECREASE: Metoprolol  tartrate 12.5 mg daily times 1 week then take as needed  *If you need a refill on your cardiac medications before your next appointment, please call your pharmacy*  Lab Work: No labs ordered today  If you have labs (blood work) drawn today and your tests are completely normal, you will receive your results only by: MyChart Message (if you have MyChart) OR A paper copy in the mail If you have any lab test that is abnormal or we need to change your treatment, we will call you to review the results.  Testing/Procedures: No test ordered today   Follow-Up: At [facility], you and your health needs are our priority.  As part of our continuing mission to provide you with exceptional heart care, our providers are all part of one team.  This team includes your primary Cardiologist (physician) and Advanced Practice Providers or APPs (Physician Assistants and Nurse Practitioners) who all work together to provide you with the care you need, when you need it.  Your next appointment:   3 month(s)  Provider:   You may see Lonni Hanson, MD or one of the following Advanced Practice Providers on your designated Care Team:   Lonni Meager, NP Lesley Maffucci, PA-C Bernardino Bring, PA-C Cadence [place], PA-C Tylene Lunch, NP Barnie Hila, NP

## 2024-02-15 NOTE — Progress Notes (Signed)
 Cardiology Office Note   Date:  02/15/2024  ID:  Zachary Rogers, DOB September 10, 1950, MRN [id number] PCP: Vicci Duwaine SQUIBB, DO  [facility] Providers Cardiologist:  Lonni Hanson, MD     History of Present Illness Zachary Rogers is a 73 y.o. male with a past medical history of stroke with subsequent discovery of large PFO status post PFO closure, A-fib status post DCCV x 2, hypertension, hyperlipidemia, nephrolithiasis, who is here today for follow-up after recent DCCV.   Seen 12/12/2021 was in atrial fibrillation RVR heart rate 113.  Labs ordered Lopressor  was restarted.  In follow-up in 12/2021 was still in A-fib RVR was sent for repeat cardioversion.  Previously was seen in November 2023 at that time he was still in atrial fibrillation and underwent cardioversion in December.  In January he was back in normal sinus rhythm and was suspected atrial fibrillation was related to PFO closure.  Apixaban  was continued.  Metoprolol  had been previously discontinued.  He was seen in clinic 01/29/2022 with sinus bradycardia with heart rate of 40 bpm and his current metoprolol  dosing was 25 mg twice daily.  He was asymptomatic.  He had not been continued on apixaban  with no issues with bleeding.  Due to his recurrent bradycardia his Lopressor  was decreased to 12.5 mg twice daily.  He was seen in clinic 05/07/2023 by Dr. Hanson. At that time he was feeling fairly well denying any chest pain, shortness of breath, palpitations and lightheadedness. He noted rare vertigo which have been longstanding and unchanged. He also reported sporadic dependent ankle edema but no persistent leg swelling. He notes that he recently spent several weeks in [place] caring for a friend with whom he was on a cruise. His friend was hospitalized with pneumonia for several weeks. He remains compliant with all of his medications including apixaban  without any further issues. There were no changes made to his medication regimen or  further testing that was needed.   He was last seen in clinic  01/07/2024  accompanied by his wife.  Stated he felt that he had been in atrial fibrillation for approximately last 4 to 5 days when he noticed progressive shortness of breath.  He has a longstanding history of atrial fibrillation with his last cardioversion in 12/2021.  Stated he had traveled a lot over the past 3 months with a trip to [place] and [place] visiting his grandkids.  He states he did overdo himself.  EKG revealed that he was teed in atrial fibrillation with a rate of around 128.  Unfortunately he had missed 1 dose of apixaban  which will have to delay a cardioversion for 3 weeks with no missed doses of OAC prior to having a DCCV done.  He was restarted on Toprol  25 mg twice daily for better rate control.  Underwent DCCV on 01/28/2024 was shocked 1 time and 150 J and converted to sinus rhythm without any immediate postoperative complications.  He returns to clinic today stating that overall he has been doing well from a cardiac perspective.  He noted an immediate difference after his cardioversion with change in his shortness of breath and fatigue.  Since his left hospital he has been taking his metoprolol  tartrate 12.5 mg twice daily.  He states that he just returned from a trip to [place] where he had been doing a lot of walking and tolerated well.  He has noted a extreme reduction in his heart rate.  He states that he has been taking  his blood thinner twice daily without any missed doses and has not noted any blood in his stool or urine.  ROS: 10 point review of systems has been reviewed and considered negative with exception was listed in the HPI  Studies Reviewed EKG Interpretation Date/Time:  Monday February 15 2024 14:13:47 EDT Ventricular Rate:  43 PR Interval:  154 QRS Duration:  106 QT Interval:  468 QTC Calculation: 395 R Axis:   -29  Text Interpretation: Marked sinus bradycardia When compared with ECG of 28-Jan-2024  06:56, PREVIOUS ECG IS PRESENT Confirmed by Gerard Frederick ([id number]) on 02/15/2024 2:17:31 PM    Echo 02/13/21   1. Aneurysmal with PFO on prior TEE. Post closure with 18 mm Aplatzer  device On 4 chamber and PSSA images no residual PFO Subcostal images poor.   2. Right atrial size was mildly dilated.   3. The mitral valve is abnormal.   4. The aortic valve is tricuspid. Aortic valve regurgitation is mild.  Mild to moderate aortic valve sclerosis/calcification is present, without  any evidence of aortic stenosis.    Cardiac Cath 01/2021 Successful transcatheter PFO closure under intracardiac echo and fluoroscopic guidance using an 18 mm Amplatzer atrial septal occluder device.  Recommendations: Limited 2D echocardiogram prior to discharge Dual antiplatelet therapy for 6 months with aspirin  and clopidogrel  SBE prophylaxis x6 months when indicated Same-day discharge if criteria met   Echo TEE 12/2020  1. Left ventricular ejection fraction, by estimation, is 60 to 65%. The  left ventricle has normal function.   2. Right ventricular systolic function is normal. The right ventricular  size is normal.   3. No left atrial/left atrial appendage thrombus was detected.   4. The interatrial septum is aneurysmal with bowing both into L and R  atrium There is a large PFO that is easily seen with color doppler with L  to R flow. Alos shown when tested with injection of agitated saline with  appearance of bubbles seen in left  sided chambers .   5. The mitral valve is normal in structure. Mild mitral valve  regurgitation.   6. Aortic valve regurgitation is trivial.   7. Minimal fixed plaquing in the thoracic aorta.  Risk Assessment/Calculations  CHA2DS2-VASc Score = 4   This indicates a 4.8% annual risk of stroke. The patient's score is based upon: CHF History: 0 HTN History: 1 Diabetes History: 0 Stroke History: 2 Vascular Disease History: 0 Age Score: 1 Gender Score: 0             Physical Exam VS:  BP 130/70 (BP Location: Left Arm, Patient Position: Sitting, Cuff Size: Normal)   Pulse (!) 43   Ht 6' 3 (1.905 m)   Wt 265 lb 12.8 oz (120.6 kg)   SpO2 98%   BMI 33.22 kg/m        Wt Readings from Last 3 Encounters:  02/15/24 265 lb 12.8 oz (120.6 kg)  01/28/24 267 lb (121.1 kg)  01/07/24 267 lb 3.2 oz (121.2 kg)    GEN: Well nourished, well developed in no acute distress NECK: No JVD; No carotid bruits CARDIAC: RRR, bradycardic, no murmurs, rubs, gallops RESPIRATORY:  Clear to auscultation without rales, wheezing or rhonchi  ABDOMEN: Soft, non-tender, non-distended EXTREMITIES:  No edema; No deformity   ASSESSMENT AND PLAN Persistent atrial fibrillation status post DCCV.  EKG today reveals marked sinus bradycardia with a rate of 43.  After cardioversion metoprolol  tartrate was decreased to 12.5 mg twice daily.  He  has been reduced to 12.5 mg daily for 1 week and then he will take on an as-needed basis for an elevated heart rate.  He has been encouraged to continue to monitor his heart rate with his Apple watch.  He has been continued on apixaban  5 mg twice daily for CHA2DS2-VASc score of at least 4 for stroke prophylaxis.  PFO status post occlusion continued on anticoagulation in the setting of atrial fibrillation.  PFO was discovered in the setting of workup for cryptogenic stroke.  He suffers from no deficits.  Hypertension with blood pressure today 130/70.  He has been continued on amlodipine  7.5 mg daily.  He has been encouraged to continue to monitor his pressures 1 to 2 hours postmedication administration at home as well.  Hyperlipidemia with an LDL of 36.  Goal is less than 70.  He has been continued on atorvastatin  80 mg daily.  Ongoing management per his PCP.       Dispo: Patient to return to clinic as MD/APP in 3 months or sooner if needed for further evaluation.  Signed, Sequoyah Counterman, NP

## 2024-03-01 DIAGNOSIS — R197 Diarrhea, unspecified: Secondary | ICD-10-CM | POA: Diagnosis not present

## 2024-03-01 NOTE — Progress Notes (Signed)
 [name] MD, MRCP(U.K) [address]  [place], [place] [address]  Main: [phone] Fax: [phone]   Gastroenterology Consultation  Referring Provider:     Vicci Duwaine SQUIBB, DO Primary Care Physician:  Vicci Duwaine SQUIBB, DO Primary Gastroenterologist:  Dr. [name]  Reason for Consultation:     Colon cancer screening         HPI:  Zachary Rogers is a 73 y.o. y/o male referred for consultation & management  by Dr. Vicci, Megan P, DO.    History of Present Illness Zachary Rogers is a 73 year old male who presents with chronic diarrhea for colonoscopy evaluation.  He has experienced diarrhea for at least a year, with five to six bowel movements per day. The stools are sometimes very watery, but there is no blood present. He does not wake up at night to have a bowel movement but generally needs to relieve himself upon waking. There has been no unintentional weight loss.  He has been taking atorvastatin  80 mg. He also takes Aleve occasionally for shoulder pain, but not regularly. He denies recent antibiotic use and has not started any new medications recently. He also takes bergamot and red yeast rice extract.  His past medical history includes a minor stroke in 2023, after which he was started on atorvastatin  and blood pressure medication, with the dosage of the latter being increased. He also takes Eliquis . He had his gallbladder removed, and he recalls that diarrhea was more prevalent after the surgery.  11/09/2018: Colonoscopy : Performed by myself was normal diverticulosis of colon was noted.   Past Medical History:  Diagnosis Date  . History of stroke   . PFO (patent foramen ovale) ([facility])     No past surgical history on file.  Prior to Admission medications  Medication Sig Taking? Last Dose  amLODIPine  (NORVASC ) 5 MG tablet Take 7.5 mg by mouth once daily Yes Taking  apixaban  (ELIQUIS ) 5 mg tablet Take 5 mg by mouth every 12 (twelve) hours Yes Taking   atorvastatin  (LIPITOR) 80 MG tablet Take 80 mg by mouth once daily Yes Taking  metoprolol  TARTrate (LOPRESSOR ) 25 MG tablet Take 25 mg by mouth Yes Taking  multivitamin tablet Take 1 tablet by mouth once daily Yes Taking  traZODone  (DESYREL ) 50 MG tablet Take 25-50 mg by mouth at bedtime as needed for Sleep Yes Taking  ascorbic acid, vitamin C, (VITAMIN C) 1000 MG tablet Take 1,000 mg by mouth once daily Patient not taking: Reported on 03/01/2024  Not Taking  cyanocobalamin (VITAMIN B12) 1000 MCG tablet Take 3,000 mcg by mouth once daily Patient not taking: Reported on 02/18/2022  Not Taking  metoprolol  succinate (TOPROL -XL) 25 MG XL tablet Take 12.5 mg by mouth 2 (two) times daily Patient not taking: Reported on 03/01/2024  Not Taking  omega-3 acid ethyl esters (LOVAZA) 1 gram capsule Take 2 g by mouth 2 (two) times daily Patient not taking: Reported on 03/01/2024  Not Taking    No family history on file.   Social History   Tobacco Use  . Smoking status: Some Days    Types: Cigars  . Smokeless tobacco: Never  Vaping Use  . Vaping status: Never Used  Substance Use Topics  . Alcohol use: Yes    Alcohol/week: 2.0 - 3.0 standard drinks of alcohol    Types: 2 - 3 Standard drinks or equivalent per week    Comment: 2-3 glasses of wine  . Drug use:  Never    Allergies as of 03/01/2024 - Reviewed 03/01/2024  Allergen Reaction Noted  . Amoxicillin  Rash 05/04/2023    Review of Systems:    All systems reviewed and negative except where noted in HPI.   Physical Exam: BP 124/71   Pulse (!) 49   Ht 190.5 cm (6' 3)   Wt (!) 119.8 kg (264 lb 3.2 oz)   BMI 33.02 kg/m  No LMP for male patient. Psych:  Alert and cooperative. Normal mood and affect. General:   Alert,  Well-developed, well-nourished, pleasant and cooperative in NAD Head:  Normocephalic and atraumatic. Eyes:  Sclera clear, no icterus.   Conjunctiva pink. Neurologic:  Alert and oriented x3;  grossly normal  neurologically. Psych:  Alert and cooperative. Normal mood and affect.  Imaging Studies: No results found.  Assessment and Plan:  Zachary Rogers is a 73 y.o. y/o male has been referred for Colon cancer screening.   Assessment & Plan Chronic diarrhea post-cholecystectomy Chronic diarrhea likely due to post-cholecystectomy bile acid malabsorption. Microscopic colitis considered. - Order stool test to rule out infection. - Perform colonoscopy to evaluate for microscopic colitis. - If stool test negative, start bile acid sequestrant therapy.  If that fails trial of Creon - obtaining clearance to hold Eliquis .   I have discussed alternative options, risks & benefits,  which include, but are not limited to, bleeding, infection, perforation,respiratory complication & drug reaction.  The patient agrees with this plan & written consent will be obtained.     Follow up in as needed  Dr [name] MD,MRCP(U.K)

## 2024-03-02 ENCOUNTER — Telehealth: Payer: Self-pay | Admitting: Internal Medicine

## 2024-03-02 NOTE — Telephone Encounter (Signed)
   Pre-operative Risk Assessment    Patient Name: Zachary Rogers  DOB: 1950/07/01 MRN: [id number]   Date of last office visit: unknown Date of next office visit: unknown   Request for Surgical Clearance    Procedure:  colonoscopy  Date of Surgery:  Clearance 03/23/24                                Surgeon:  TBD Surgeon's Group or Practice Name:  [facility] Gastroenterology dept Phone number:  [phone] Fax number:  [phone]   Type of Clearance Requested:   - Medical    Type of Anesthesia:  General    Additional requests/questions:    SignedBerwyn LELON Sprung   03/02/2024, 11:00 AM

## 2024-03-02 NOTE — Telephone Encounter (Signed)
 Tylene,  You saw this patient on 02/15/2024. Per protocol we request that you comment on his cardiac risk to proceed with colonoscopy since it has been less than 2 months since evaluated in the office. Please send your comment to P CV Pre-Op Pool.  Thank you, Lamarr Satterfield DNP, ANP, AACC.

## 2024-03-03 DIAGNOSIS — R197 Diarrhea, unspecified: Secondary | ICD-10-CM | POA: Diagnosis not present

## 2024-03-03 NOTE — Telephone Encounter (Signed)
 Patient was without symptoms of decompensation.  He has been on oral anticoagulation for a month after cardioversion procedure.  He can proceed with his procedure at acceptable risk without any further cardiovascular testing needed at this time.

## 2024-03-04 NOTE — Telephone Encounter (Signed)
 Patient with diagnosis of atrial fibrillation on Eliquis  for anticoagulation.    Procedure:  colonoscopy   Date of Surgery:  Clearance 03/23/24     CHA2DS2-VASc Score = 4   This indicates a 4.8% annual risk of stroke. The patient's score is based upon: CHF History: 0 HTN History: 1 Diabetes History: 0 Stroke History: 2 Vascular Disease History: 0 Age Score: 1 Gender Score: 0   Chart indicates stroke in 2022  CrCl 91 Platelet count 180   Patient has not  had an Afib/aflutter ablation in the last 3 months,  or a watchman implanted in the last 45 days   Did have DCCV 01/28/24, has now been 4 weeks beyond that  Per office protocol, patient can hold Eliquis  for 2 days prior to procedure.   Patient will not need bridging with Lovenox (enoxaparin) around procedure.  **This guidance is not considered finalized until pre-operative APP has relayed final recommendations.**

## 2024-03-04 NOTE — Telephone Encounter (Signed)
   Patient Name: Zachary Rogers  DOB: [birth year] MRN: [id number]  Primary Cardiologist: Lonni Hanson, MD  Chart reviewed as part of pre-operative protocol coverage. Given past medical history and time since last visit, based on ACC/AHA guidelines, Nicholad Kautzman is at acceptable risk for the planned procedure without further cardiovascular testing.   Patient was without symptoms of decompensation.  He has been on oral anticoagulation for a month after cardioversion procedure.  He can proceed with his procedure at acceptable risk without any further cardiovascular testing needed at this time.    Per office protocol, patient can hold Eliquis  for 2 days prior to procedure.   Patient will not need bridging with Lovenox (enoxaparin) around procedure.  The patient was advised that if he develops new symptoms prior to surgery to contact our office to arrange for a follow-up visit, and he verbalized understanding.  I will route this recommendation to the requesting party via Epic fax function and remove from pre-op pool.  Please call with questions.  Lamarr Satterfield, NP 03/04/2024, 3:05 PM

## 2024-03-14 NOTE — Telephone Encounter (Signed)
 I will re-fax notes providing clearance and recommendations for Eliquis . Notes were originally faxed on 03/04/24 by Lamarr Satterfield, DNP.

## 2024-03-14 NOTE — Telephone Encounter (Signed)
 Caller Randa) called to follow-up on the status of patient's clearance.  Caller stated she will need the update by 10/31.

## 2024-03-23 ENCOUNTER — Ambulatory Visit: Admitting: Anesthesiology

## 2024-03-23 ENCOUNTER — Encounter: Payer: Self-pay | Admitting: Gastroenterology

## 2024-03-23 ENCOUNTER — Ambulatory Visit
Admission: RE | Admit: 2024-03-23 | Discharge: 2024-03-23 | Disposition: A | Discharge status: Home / Self Care | Attending: Gastroenterology | Admitting: Gastroenterology

## 2024-03-23 ENCOUNTER — Other Ambulatory Visit: Payer: Self-pay

## 2024-03-23 ENCOUNTER — Encounter
Admission: RE | Disposition: A | Discharge status: Home / Self Care | Payer: Self-pay | Source: Home / Self Care | Attending: Gastroenterology

## 2024-03-23 DIAGNOSIS — K529 Noninfective gastroenteritis and colitis, unspecified: Secondary | ICD-10-CM | POA: Insufficient documentation

## 2024-03-23 DIAGNOSIS — E66813 Obesity, class 3: Secondary | ICD-10-CM | POA: Insufficient documentation

## 2024-03-23 DIAGNOSIS — K52832 Lymphocytic colitis: Secondary | ICD-10-CM | POA: Diagnosis not present

## 2024-03-23 DIAGNOSIS — K5730 Diverticulosis of large intestine without perforation or abscess without bleeding: Secondary | ICD-10-CM | POA: Diagnosis not present

## 2024-03-23 DIAGNOSIS — I4891 Unspecified atrial fibrillation: Secondary | ICD-10-CM | POA: Insufficient documentation

## 2024-03-23 DIAGNOSIS — F17200 Nicotine dependence, unspecified, uncomplicated: Secondary | ICD-10-CM | POA: Diagnosis not present

## 2024-03-23 DIAGNOSIS — E785 Hyperlipidemia, unspecified: Secondary | ICD-10-CM | POA: Diagnosis not present

## 2024-03-23 DIAGNOSIS — I2510 Atherosclerotic heart disease of native coronary artery without angina pectoris: Secondary | ICD-10-CM | POA: Diagnosis not present

## 2024-03-23 DIAGNOSIS — I4819 Other persistent atrial fibrillation: Secondary | ICD-10-CM | POA: Diagnosis not present

## 2024-03-23 DIAGNOSIS — I10 Essential (primary) hypertension: Secondary | ICD-10-CM | POA: Diagnosis not present

## 2024-03-23 HISTORY — PX: COLONOSCOPY: SHX5424

## 2024-03-23 SURGERY — COLONOSCOPY
Anesthesia: General

## 2024-03-23 MED ORDER — GLYCOPYRROLATE 0.2 MG/ML IJ SOLN
INTRAMUSCULAR | Status: AC
  Filled 2024-03-23: qty 1

## 2024-03-23 MED ORDER — LIDOCAINE HCL (PF) 2 % IJ SOLN
INTRAMUSCULAR | Status: AC
  Filled 2024-03-23: qty 5

## 2024-03-23 MED ORDER — PROPOFOL 10 MG/ML IV BOLUS
INTRAVENOUS | Status: DC | PRN
  Administered 2024-03-23 (×2): 50 mg via INTRAVENOUS

## 2024-03-23 MED ORDER — PROPOFOL 500 MG/50ML IV EMUL
INTRAVENOUS | Status: DC | PRN
Start: 2024-03-23 — End: 2024-03-23
  Administered 2024-03-23: 75 ug/kg/min via INTRAVENOUS

## 2024-03-23 MED ORDER — LIDOCAINE HCL (CARDIAC) PF 100 MG/5ML IV SOSY
PREFILLED_SYRINGE | INTRAVENOUS | Status: DC | PRN
  Administered 2024-03-23: 80 mg via INTRAVENOUS

## 2024-03-23 MED ORDER — EPHEDRINE 5 MG/ML INJ
INTRAVENOUS | Status: AC
  Filled 2024-03-23: qty 5

## 2024-03-23 MED ORDER — SODIUM CHLORIDE 0.9 % IV SOLN: INTRAVENOUS | Status: DC

## 2024-03-23 MED ORDER — GLYCOPYRROLATE 0.2 MG/ML IJ SOLN
INTRAMUSCULAR | Status: DC | PRN
  Administered 2024-03-23: .2 mg via INTRAVENOUS

## 2024-03-23 MED ORDER — DEXMEDETOMIDINE HCL IN NACL 80 MCG/20ML IV SOLN
INTRAVENOUS | Status: AC
  Filled 2024-03-23: qty 20

## 2024-03-23 NOTE — Anesthesia Preprocedure Evaluation (Signed)
 Anesthesia Evaluation  Patient identified by MRN, date of birth, ID band Patient awake    Reviewed: Allergy & Precautions, NPO status , Patient's Chart, lab work & pertinent test results  Airway Mallampati: II  TM Distance: >3 FB Neck ROM: Full    Dental  (+) Teeth Intact   Pulmonary neg pulmonary ROS, Current Smoker and Patient abstained from smoking.   Pulmonary exam normal        Cardiovascular Exercise Tolerance: Good + CAD  negative cardio ROS Normal cardiovascular exam+ dysrhythmias Atrial Fibrillation  Rhythm:Regular Rate:Bradycardia  Hx of PFO repaired   Neuro/Psych CVA, No Residual Symptoms negative neurological ROS  negative psych ROS   GI/Hepatic negative GI ROS, Neg liver ROS,,,  Endo/Other  negative endocrine ROS  Class 3 obesity  Renal/GU negative Renal ROS  negative genitourinary   Musculoskeletal   Abdominal  (+) + obese  Peds negative pediatric ROS (+)  Hematology negative hematology ROS (+)   Anesthesia Other Findings Past Medical History: No date: Actinic keratosis     Comment:  R lat neck - bx proven with verruca vulgaris 10/25/2020: Cerebrovascular accident (CVA) (HCC) 06/22/2017: Dysplastic nevus     Comment:  L mid back paraspinal -severe, excision 10/06/2017 No date: Hypertension 09/04/2020: Lateral epicondylitis (tennis elbow) 07/18/2019: Mixed hyperlipidemia No date: Nephrolithiasis  Past Surgical History: 12/17/2020: BUBBLE STUDY     Comment:  Procedure: BUBBLE STUDY;  Surgeon: Okey Vina GAILS, MD;                Location: [facility] ENDOSCOPY;  Service: Cardiovascular;; 04/30/2021: CARDIOVERSION; N/A     Comment:  Procedure: CARDIOVERSION;  Surgeon: Mady Bruckner,               MD;  Location: [facility] ORS;  Service: Cardiovascular;                Laterality: N/A; 12/24/2021: CARDIOVERSION; N/A     Comment:  Procedure: CARDIOVERSION;  Surgeon: Darron Deatrice LABOR,               MD;   Location: [facility] ORS;  Service: Cardiovascular;                Laterality: N/A; 01/28/2024: CARDIOVERSION; N/A     Comment:  Procedure: CARDIOVERSION;  Surgeon: Perla Evalene PARAS,               MD;  Location: [facility] ORS;  Service: Cardiovascular;                Laterality: N/A; 06/07/1998: CHOLECYSTECTOMY No date: COLONOSCOPY 11/09/2018: COLONOSCOPY WITH PROPOFOL ; N/A     Comment:  Procedure: COLONOSCOPY WITH PROPOFOL ;  Surgeon: Therisa Bi, MD;  Location: [facility] ENDOSCOPY;  Service:               Gastroenterology;  Laterality: N/A; 02/13/2021: PATENT FORAMEN OVALE(PFO) CLOSURE; N/A     Comment:  Procedure: PATENT FORAMEN OVALE (PFO) CLOSURE;  Surgeon:              Wonda Sharper, MD;  Location: [facility] INVASIVE CV LAB;                Service: Cardiovascular;  Laterality: N/A; No date: SKIN LESION EXCISION     Comment:  dermatlogy - precancerous lesion 1 year ago on back  12/17/2020: TEE WITHOUT CARDIOVERSION; N/A     Comment:  Procedure: TRANSESOPHAGEAL ECHOCARDIOGRAM (TEE);  Surgeon: Okey Vina GAILS, MD;  Location: [facility] ENDOSCOPY;                Service: Cardiovascular;  Laterality: N/A; No date: TOE SURGERY; Bilateral  BMI    Body Mass Index: 32.50 kg/m      Reproductive/Obstetrics negative OB ROS                              Anesthesia Physical Anesthesia Plan  ASA: 3  Anesthesia Plan: General   Post-op Pain Management:    Induction: Intravenous  PONV Risk Score and Plan: Propofol  infusion and TIVA  Airway Management Planned: Natural Airway and Nasal Cannula  Additional Equipment:   Intra-op Plan:   Post-operative Plan:   Informed Consent: I have reviewed the patients History and Physical, chart, labs and discussed the procedure including the risks, benefits and alternatives for the proposed anesthesia with the patient or authorized representative who has indicated his/her understanding and acceptance.     Dental  Advisory Given  Plan Discussed with: CRNA  Anesthesia Plan Comments:         Anesthesia Quick Evaluation

## 2024-03-23 NOTE — Op Note (Signed)
 [facility] Gastroenterology Patient Name: Zachary Rogers Procedure Date: 03/23/2024 10:27 AM MRN: [id number] Account #: [id number] Date of Birth: 1950/07/31 Admit Type: Outpatient Age: 73 Room: [facility] ENDO ROOM 3 Gender: Male Note Status: Finalized Instrument Name: Colon Scope [id number] Procedure:             Colonoscopy Indications:           Chronic diarrhea Providers:             [name] MD, MD Referring MD:          [name] MD, MD (Referring MD), Duwaine MYRTIS Louder                         (Referring MD) Medicines:             Monitored Anesthesia Care Complications:         No immediate complications. Procedure:             Pre-Anesthesia Assessment:                        - Prior to the procedure, a History and Physical was                         performed, and patient medications, allergies and                         sensitivities were reviewed. The patient's tolerance                         of previous anesthesia was reviewed.                        - The risks and benefits of the procedure and the                         sedation options and risks were discussed with the                         patient. All questions were answered and informed                         consent was obtained.                        - ASA Grade Assessment: II - A patient with mild                         systemic disease.                        After obtaining informed consent, the colonoscope was                         passed under direct vision. Throughout the procedure,                         the patient's blood pressure, pulse, and oxygen                         saturations were monitored  continuously. The                         Colonoscope was introduced through the anus and                         advanced to the the cecum, identified by the                         appendiceal orifice. The colonoscopy was performed                         with ease. The patient  tolerated the procedure well.                         The quality of the bowel preparation was excellent.                         The ileocecal valve, appendiceal orifice, and rectum                         were photographed. Findings:      The perianal and digital rectal examinations were normal.      Multiple small-mouthed diverticula were found in the left colon.      Normal mucosa was found in the entire colon. Biopsies were taken with a       cold forceps for histology.      The exam was otherwise without abnormality on direct and retroflexion       views. Impression:            - Diverticulosis in the left colon.                        - Normal mucosa in the entire examined colon. Biopsied.                        - The examination was otherwise normal on direct and                         retroflexion views. Recommendation:        - Discharge patient to home (with escort).                        - Resume previous diet.                        - Continue present medications.                        - Await pathology results.                        - Repeat colonoscopy is not recommended due to current                         age (56 years or older) for screening purposes.                        - Return to my office as previously scheduled. Procedure Code(s):     ---  Professional ---                        [phone], Colonoscopy, flexible; with biopsy, single or                         multiple Diagnosis Code(s):     --- Professional ---                        K52.9, Noninfective gastroenteritis and colitis,                         unspecified                        K57.30, Diverticulosis of large intestine without                         perforation or abscess without bleeding CPT copyright 2022 American Medical Association. All rights reserved. The codes documented in this report are preliminary and upon coder review may  be revised to meet current compliance requirements. [name], MD [name] MD, MD 03/23/2024 10:53:38 AM This report has been signed electronically. Number of Addenda: 0 Note Initiated On: 03/23/2024 10:27 AM Scope Withdrawal Time: 0 hours 9 minutes 1 second  Total Procedure Duration: 0 hours 10 minutes 27 seconds  Estimated Blood Loss:  Estimated blood loss: none.      [facility]

## 2024-03-23 NOTE — Anesthesia Postprocedure Evaluation (Signed)
 Anesthesia Post Note  Patient: Zachary Rogers  Procedure(s) Performed: COLONOSCOPY  Patient location during evaluation: PACU Anesthesia Type: General Level of consciousness: awake and alert Pain management: satisfactory to patient Vital Signs Assessment: post-procedure vital signs reviewed and stable Respiratory status: spontaneous breathing Cardiovascular status: blood pressure returned to baseline Anesthetic complications: no   No notable events documented.   Last Vitals:  Vitals:   03/23/24 1055 03/23/24 1105  BP: 98/60 90/65  Pulse: (!) 39 (!) 46  Resp: 14 13  Temp:    SpO2: 98% 97%    Last Pain:  Vitals:   03/23/24 1105  TempSrc:   PainSc: 0-No pain                 VAN STAVEREN,Jann Ra

## 2024-03-23 NOTE — Transfer of Care (Signed)
 Immediate Anesthesia Transfer of Care Note  Patient: Zachary Rogers  Procedure(s) Performed: COLONOSCOPY  Patient Location: PACU  Anesthesia Type:General  Level of Consciousness: sedated  Airway & Oxygen Therapy: Patient Spontanous Breathing  Post-op Assessment: Report given to RN and Post -op Vital signs reviewed and stable  Post vital signs: Reviewed and stable  Last Vitals:  Vitals Value Taken Time  BP 98/60 03/23/24 10:55  Temp    Pulse 40 03/23/24 10:55  Resp 13 03/23/24 10:55  SpO2 96 % 03/23/24 10:55  Vitals shown include unfiled device data.  Last Pain:  Vitals:   03/23/24 1015  TempSrc: Temporal  PainSc: 0-No pain         Complications: No notable events documented.

## 2024-03-23 NOTE — H&P (Signed)
 Zachary Rogers , MD [address], [address], [place], [place], [address] Phone: [phone] Fax: [phone]  Primary Care Physician:  Vicci Duwaine SQUIBB, DO   Pre-Procedure History & Physical: HPI:  Zachary Rogers is a 73 y.o. male is here for an colonoscopy.   Past Medical History:  Diagnosis Date   Actinic keratosis    R lat neck - bx proven with verruca vulgaris   Cerebrovascular accident (CVA) (HCC) 10/25/2020   Dysplastic nevus 06/22/2017   L mid back paraspinal -severe, excision 10/06/2017   Hypertension    Lateral epicondylitis (tennis elbow) 09/04/2020   Mixed hyperlipidemia 07/18/2019   Nephrolithiasis     Past Surgical History:  Procedure Laterality Date   BUBBLE STUDY  12/17/2020   Procedure: BUBBLE STUDY;  Surgeon: Okey Vina GAILS, MD;  Location: [facility] ENDOSCOPY;  Service: Cardiovascular;;   CARDIOVERSION N/A 04/30/2021   Procedure: CARDIOVERSION;  Surgeon: Mady Bruckner, MD;  Location: [facility] ORS;  Service: Cardiovascular;  Laterality: N/A;   CARDIOVERSION N/A 12/24/2021   Procedure: CARDIOVERSION;  Surgeon: Darron Deatrice LABOR, MD;  Location: [facility] ORS;  Service: Cardiovascular;  Laterality: N/A;   CARDIOVERSION N/A 01/28/2024   Procedure: CARDIOVERSION;  Surgeon: Perla Evalene PARAS, MD;  Location: [facility] ORS;  Service: Cardiovascular;  Laterality: N/A;   CHOLECYSTECTOMY  06/07/1998   COLONOSCOPY     COLONOSCOPY WITH PROPOFOL  N/A 11/09/2018   Procedure: COLONOSCOPY WITH PROPOFOL ;  Surgeon: Rogers Ruel, MD;  Location: [facility] ENDOSCOPY;  Service: Gastroenterology;  Laterality: N/A;   PATENT FORAMEN OVALE(PFO) CLOSURE N/A 02/13/2021   Procedure: PATENT FORAMEN OVALE (PFO) CLOSURE;  Surgeon: Wonda Sharper, MD;  Location: [facility] INVASIVE CV LAB;  Service: Cardiovascular;  Laterality: N/A;   SKIN LESION EXCISION     dermatlogy - precancerous lesion 1 year  ago on back    TEE WITHOUT CARDIOVERSION N/A 12/17/2020   Procedure: TRANSESOPHAGEAL ECHOCARDIOGRAM (TEE);  Surgeon: Okey Vina GAILS, MD;  Location: [facility] ENDOSCOPY;  Service: Cardiovascular;  Laterality: N/A;   TOE SURGERY Bilateral     Prior to Admission medications   Medication Sig Start Date End Date Taking? Authorizing Provider  amLODipine  (NORVASC ) 5 MG tablet Take 1.5 tablets (7.5 mg total) by mouth daily. 01/06/24   Johnson, Megan P, DO  apixaban  (ELIQUIS ) 5 MG TABS tablet Take 1 tablet (5 mg total) by mouth 2 (two) times daily. 01/06/24   Johnson, Megan P, DO  atorvastatin  (LIPITOR) 80 MG tablet Take 1 tablet (80 mg total) by mouth daily. 01/06/24   Johnson, Megan P, DO  Bergamot Oil OIL Take 1 mL by mouth daily.    [provider]  metoprolol  tartrate (LOPRESSOR ) 25 MG tablet Take 1 tablet (25 mg total) by mouth 2 (two) times daily. Take 12.5 mg daily for 1 week, then take as needed 02/15/24 02/14/25  Gerard Frederick, NP  Multiple Vitamin (MULTIVITAMIN) capsule Take 1 capsule by mouth daily.    [provider]  Red Yeast Rice Extract (RED YEAST RICE PO) Take 1 mL by mouth daily. With co-q 10    [provider]  traZODone  (DESYREL ) 50 MG tablet Take 0.5-1 tablets (25-50 mg total) by mouth at bedtime as needed for sleep. 01/06/24   Vicci Duwaine P, DO    Allergies as of 03/15/2024 - Review Complete 02/15/2024  Allergen Reaction Noted   Amoxicillin  Rash 07/22/2022    Family History  Problem Relation Age of Onset   Lung cancer Mother    Hypertension Father  Diabetes Father    Transient ischemic attack Father    Heart attack Maternal Grandfather    Prostate cancer Neg Hx    Bladder Cancer Neg Hx    Kidney cancer Neg Hx     Social History   Socioeconomic History   Marital status: Married    Spouse name: Terri   Number of children: 3   Years of education: Not on file   Highest education level: Master's degree (e.g., MA, MS, MEng, MEd, MSW, MBA)   Occupational History   Occupation: working fulltime    Occupation: retired  Tobacco Use   Smoking status: Some Days    Types: Cigars    Passive exposure: Past   Smokeless tobacco: Never   Tobacco comments:    social smoker when younger, I have a cigar every now and then.  Vaping Use   Vaping status: Never Used  Substance and Sexual Activity   Alcohol use: Yes    Alcohol/week: 5.0 standard drinks of alcohol    Types: 5 Glasses of wine per week    Comment: 1 glass of wine with dinner 5 x per week   Drug use: No   Sexual activity: Yes    Birth control/protection: Surgical  Other Topics Concern   Not on file  Social History Licensed Conveyancer, local friends.    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   Overall Financial Resource Strain (CARDIA)    Difficulty of Paying Living Expenses: Not hard at all  Food Insecurity: No Food Insecurity (03/01/2024)   Received from [facility]   Hunger Vital Sign    Within the past 12 months, you worried that your food would run out before you got the money to buy more.: Never true    Within the past 12 months, the food you bought just didn't last and you didn't have money to get more.: Never true  Transportation Needs: No Transportation Needs (03/01/2024)   Received from [facility] - Transportation    In the past 12 months, has lack of transportation kept you from medical appointments or from getting medications?: No    Lack of Transportation (Non-Medical): No  Physical Activity: Sufficiently Active (01/05/2024)   Exercise Vital Sign    Days of Exercise per Week: 3 days    Minutes of Exercise per Session: 60 min  Stress: No Stress Concern Present (01/05/2024)   [employer] of Occupational Health - Occupational Stress Questionnaire    Feeling of Stress: Only  a little  Social Connections: Moderately Integrated (01/05/2024)   Social Connection and Isolation Panel    Frequency of Communication with Friends and Family: More than three times a week    Frequency of Social Gatherings with Friends and Family: Once a week    Attends Religious Services: 1 to 4 times per year    Active Member of [employer] or Organizations: No    Attends Banker Meetings: Not on file    Marital Status: Married  Catering Manager Violence: Not At Risk (10/08/2023)   Humiliation, Afraid, Rape, and Kick questionnaire    Fear of Current or Ex-Partner: No    Emotionally Abused: No    Physically Abused: No    Sexually Abused: No    Review of Systems: See HPI, otherwise negative ROS  Physical Exam:  There were no vitals taken for this visit. General:   Alert,  pleasant and cooperative in NAD Head:  Normocephalic and atraumatic. Neck:  Supple; no masses or thyromegaly. Lungs:  Clear throughout to auscultation, normal respiratory effort.    Heart:  +S1, +S2, Regular rate and rhythm, No edema. Abdomen:  Soft, nontender and nondistended. Normal bowel sounds, without guarding, and without rebound.   Neurologic:  Alert and  oriented x4;  grossly normal neurologically.  Impression/Plan: Zachary Rogers is here for an colonoscopy to be performed for chronic diarrhea Risks, benefits, limitations, and alternatives regarding  colonoscopy have been reviewed with the patient.  Questions have been answered.  All parties agreeable.   Zachary Kung, MD  03/23/2024, 8:54 AM

## 2024-03-24 LAB — SURGICAL PATHOLOGY

## 2024-03-24 NOTE — Addendum Note (Signed)
 Addendum  created 03/24/24 0959 by Stacie Channel, CRNA   Intraprocedure Staff edited

## 2024-04-05 DIAGNOSIS — K52832 Lymphocytic colitis: Secondary | ICD-10-CM | POA: Diagnosis not present

## 2024-04-26 DIAGNOSIS — Z8673 Personal history of transient ischemic attack (TIA), and cerebral infarction without residual deficits: Secondary | ICD-10-CM | POA: Diagnosis not present

## 2024-04-26 DIAGNOSIS — Q2112 Patent foramen ovale: Secondary | ICD-10-CM | POA: Diagnosis not present

## 2024-04-26 DIAGNOSIS — M5432 Sciatica, left side: Secondary | ICD-10-CM | POA: Diagnosis not present

## 2024-04-26 DIAGNOSIS — I639 Cerebral infarction, unspecified: Secondary | ICD-10-CM | POA: Diagnosis not present

## 2024-05-21 NOTE — Progress Notes (Unsigned)
 " Cardiology Office Note   Date:  05/24/2024  ID:  Zachary Rogers, DOB November 25, 1950, MRN [id number] PCP: Vicci Duwaine SQUIBB, DO  [facility] Providers Cardiologist:  Lonni Hanson, MD     History of Present Illness Zachary Rogers is a 74 y.o. male with a past medical history of stroke with subsequent discovery of large PFO status post PFO closure, A-fib status post DCCV x 2, hypertension, hyperlipidemia, nephrolithiasis, who is here today for follow-up.   Seen 12/12/2021 was in atrial fibrillation RVR heart rate 113.  Labs ordered Lopressor  was restarted.  In follow-up in 12/2021 was still in A-fib RVR was sent for repeat cardioversion.  Previously was seen in November 2023 at that time he was still in atrial fibrillation and underwent cardioversion in December.  In January he was back in normal sinus rhythm and was suspected atrial fibrillation was related to PFO closure.  Apixaban  was continued.  Metoprolol  had been previously discontinued.  He was seen in clinic 01/29/2022 with sinus bradycardia with heart rate of 40 bpm and his current metoprolol  dosing was 25 mg twice daily.  He was asymptomatic.  He had not been continued on apixaban  with no issues with bleeding.  Due to his recurrent bradycardia his Lopressor  was decreased to 12.5 mg twice daily.  He was seen in clinic 05/07/2023 by Dr. Hanson. At that time he was feeling fairly well denying any chest pain, shortness of breath, palpitations and lightheadedness. He noted rare vertigo which have been longstanding and unchanged. He also reported sporadic dependent ankle edema but no persistent leg swelling. He notes that he recently spent several weeks in [place] caring for a friend with whom he was on a cruise. His friend was hospitalized with pneumonia for several weeks. He remains compliant with all of his medications including apixaban  without any further issues. There were no changes made to his medication regimen or further testing that was  needed.  He had previously been seen in clinic 01/07/2024 accompanied by his wife.  He stated he had traveled a lot in the past 3 months with a trip to [place] and [place] visiting his grandkids he states that he overdid his self.  EKG revealed that he was back in atrial fibrillation with a rate of around 128.  Unfortunately he had missed a dose of apixaban  which delayed cardioversion for 3 weeks with no missed doses.  He was started on Toprol  25 mg twice daily for better rate control and underwent DCCV on 01/28/2024 which shocked 1 time 150 J and converted to sinus rhythm without any postoperative complications.  He was last seen in clinic 02/15/2024 stating overall he been doing well from a cardiac perspective he had noted an immediate difference after his cardioversion with a change in his shortness of breath and fatigue.  Since he left the hospital he been taking his metoprolol  tartrate 12.5 mg twice daily he states that he had returned from a trip to [place] where he had been doing a lot of walking and tolerated it well.  He had noted an 8 stream reduction in his heart rate he states that he had been taking his blood thinner twice daily without any missed doses and without any issues of bleeding.  With metoprolol  being decreased to 12.5 mg twice daily after his cardioversion a he was advised to reduce to 12.5 mg once daily for 1 week and then to take on an as needed basis as he has marked sinus  bradycardia with a rate of 43 on EKG and continue to monitor his heart rate with his Apple Watch.   He returns to clinic today stating that he has been doing well from a cardiac perspective.  He denies any lightheadedness and dizziness palpitation chest pain or shortness of breath.  Stated that his GI provider did question his heart rate when he recently underwent GI workup.  At his last visit his metoprolol  was recently decreased.  He has been taking it daily.  Without to be in symptomatic he can continue on his current  medication regimen.  He denies any bleeding with no blood noted in his urine or stool and states that he has not missed any of his blood thinner.  Denies any hospitalizations or visits to the emergency department.  ROS: 10 point review of system has been reviewed and considered negative exception with the listed in the HPI  Studies Reviewed EKG Interpretation Date/Time:  Tuesday May 24 2024 08:43:56 EST Ventricular Rate:  54 PR Interval:  162 QRS Duration:  104 QT Interval:  410 QTC Calculation: 388 R Axis:   -45  Text Interpretation: Sinus bradycardia Left anterior fascicular block When compared with ECG of 15-Feb-2024 14:13, No significant change since last tracing Confirmed by Gerard Frederick ([id number]) on 05/24/2024 8:45:08 AM    Echo 02/13/21   1. Aneurysmal with PFO on prior TEE. Post closure with 18 mm Aplatzer  device On 4 chamber and PSSA images no residual PFO Subcostal images poor.   2. Right atrial size was mildly dilated.   3. The mitral valve is abnormal.   4. The aortic valve is tricuspid. Aortic valve regurgitation is mild.  Mild to moderate aortic valve sclerosis/calcification is present, without  any evidence of aortic stenosis.    Cardiac Cath 01/2021 Successful transcatheter PFO closure under intracardiac echo and fluoroscopic guidance using an 18 mm Amplatzer atrial septal occluder device.  Recommendations: Limited 2D echocardiogram prior to discharge Dual antiplatelet therapy for 6 months with aspirin  and clopidogrel  SBE prophylaxis x6 months when indicated Same-day discharge if criteria met   Echo TEE 12/2020  1. Left ventricular ejection fraction, by estimation, is 60 to 65%. The  left ventricle has normal function.   2. Right ventricular systolic function is normal. The right ventricular  size is normal.   3. No left atrial/left atrial appendage thrombus was detected.   4. The interatrial septum is aneurysmal with bowing both into L and R  atrium There is  a large PFO that is easily seen with color doppler with L  to R flow. Alos shown when tested with injection of agitated saline with  appearance of bubbles seen in left  sided chambers .   5. The mitral valve is normal in structure. Mild mitral valve  regurgitation.   6. Aortic valve regurgitation is trivial.   7. Minimal fixed plaquing in the thoracic aorta.   Risk Assessment/Calculations  CHA2DS2-VASc Score =     This indicates a  % annual risk of stroke. The patient's score is based upon:             Physical Exam VS:  BP 130/72 (BP Location: Left Arm, Patient Position: Sitting, Cuff Size: Large)   Pulse (!) 54   Ht 6' 3 (1.905 m)   Wt 262 lb 3.2 oz (118.9 kg)   SpO2 95%   BMI 32.77 kg/m        Wt Readings from Last 3 Encounters:  05/24/24 262  lb 3.2 oz (118.9 kg)  03/23/24 260 lb (117.9 kg)  02/15/24 265 lb 12.8 oz (120.6 kg)    GEN: Well nourished, well developed in no acute distress NECK: No JVD; No carotid bruits CARDIAC: RRR, bradycardic, no murmurs, rubs, gallops RESPIRATORY:  Clear to auscultation without rales, wheezing or rhonchi  ABDOMEN: Soft, non-tender, non-distended EXTREMITIES:  No edema; No deformity   ASSESSMENT AND PLAN Persistent atrial fibrillation status post DCCV and now maintaining sinus rhythm.  EKG today reveals sinus bradycardia with a rate of 54 with a left anterior fascicular block.  After his cardioversion his metoprolol  tartrate was decreased to 12.5 mg twice daily and he has been taking it at least once daily since that time.  He was previously advised he can take 12.5 mg once daily and then take on an as needed basis but he has continued to take it on a daily basis and is encouraged to continue to monitor his heart rate with his Apple Watch.  He is continued on apixaban  5 mg twice daily for CHA2DS2-VASc of at least 4 for stroke prophylaxis.  He denies any bleeding with OAC.  PFO status post occlusion continued on anticoagulation in setting  of atrial fibrillation.  PFO was discovered to send workup for cryptogenic stroke.  He continues to suffer from no deficits at this time.  Hypertension with a blood pressure today 130/72.  He has been continued on amlodipine  7.5 mg daily and metoprolol  to tartrate 12.5 mg daily.  He has been encouraged to continue to monitor his pressures 1 to 2 hours postmedication administration as well.  Hyperlipidemia with an LDL 36 which is below goal of less than 70.  He has been continued on atorvastatin  80 mg daily.  Ongoing management per his PCP.       Dispo: Patient to return to clinic to see MD/APP on an as needed basis as he is getting ready to move to the [facility] area  Signed, Julie Nay, NP   "

## 2024-05-24 ENCOUNTER — Encounter: Payer: Self-pay | Admitting: Cardiology

## 2024-05-24 ENCOUNTER — Ambulatory Visit: Attending: Cardiology | Admitting: Cardiology

## 2024-05-24 VITALS — BP 130/72 | HR 54 | Ht 75.0 in | Wt 262.2 lb

## 2024-05-24 DIAGNOSIS — Z8673 Personal history of transient ischemic attack (TIA), and cerebral infarction without residual deficits: Secondary | ICD-10-CM | POA: Insufficient documentation

## 2024-05-24 DIAGNOSIS — E785 Hyperlipidemia, unspecified: Secondary | ICD-10-CM | POA: Diagnosis not present

## 2024-05-24 DIAGNOSIS — Q2112 Patent foramen ovale: Secondary | ICD-10-CM | POA: Diagnosis not present

## 2024-05-24 DIAGNOSIS — I10 Essential (primary) hypertension: Secondary | ICD-10-CM | POA: Insufficient documentation

## 2024-05-24 DIAGNOSIS — I4819 Other persistent atrial fibrillation: Secondary | ICD-10-CM | POA: Insufficient documentation

## 2024-05-24 MED ORDER — METOPROLOL TARTRATE 25 MG PO TABS: 12.5000 mg | ORAL_TABLET | Freq: Every day | ORAL | Status: AC

## 2024-05-24 NOTE — Patient Instructions (Signed)
 Medication Instructions:  Your physician recommends that you continue on your current medications as directed. Please refer to the Current Medication list given to you today.   *If you need a refill on your cardiac medications before your next appointment, please call your pharmacy*  Lab Work: No labs ordered today  If you have labs (blood work) drawn today and your tests are completely normal, you will receive your results only by: MyChart Message (if you have MyChart) OR A paper copy in the mail If you have any lab test that is abnormal or we need to change your treatment, we will call you to review the results.  Testing/Procedures: No test ordered today   Follow-Up: At [facility], you and your health needs are our priority.  As part of our continuing mission to provide you with exceptional heart care, our providers are all part of one team.  This team includes your primary Cardiologist (physician) and Advanced Practice Providers or APPs (Physician Assistants and Nurse Practitioners) who all work together to provide you with the care you need, when you need it.  Your next appointment:   Follow up as needed  Provider:   You may see Lonni Hanson, MD or one of the following Advanced Practice Providers on your designated Care Team:   Tylene Lunch, NP

## 2024-07-08 ENCOUNTER — Ambulatory Visit: Admitting: Family Medicine

## 2024-09-30 ENCOUNTER — Ambulatory Visit: Admitting: Urology

## 2024-10-19 ENCOUNTER — Ambulatory Visit: Admitting: Dermatology

## 2024-10-20 ENCOUNTER — Ambulatory Visit
